# Patient Record
Sex: Male | Born: 1955
Health system: Southern US, Community
[De-identification: ages and names within clinical notes are randomized; demographics above are authoritative.]

## PROBLEM LIST (undated history)

## (undated) DIAGNOSIS — I10 Essential (primary) hypertension: Secondary | ICD-10-CM

## (undated) DIAGNOSIS — R252 Cramp and spasm: Secondary | ICD-10-CM

## (undated) DIAGNOSIS — R7611 Nonspecific reaction to tuberculin skin test without active tuberculosis: Secondary | ICD-10-CM

## (undated) DIAGNOSIS — E785 Hyperlipidemia, unspecified: Secondary | ICD-10-CM

## (undated) DIAGNOSIS — K219 Gastro-esophageal reflux disease without esophagitis: Secondary | ICD-10-CM

## (undated) DIAGNOSIS — K5900 Constipation, unspecified: Secondary | ICD-10-CM

## (undated) DIAGNOSIS — T7840XA Allergy, unspecified, initial encounter: Secondary | ICD-10-CM

## (undated) DIAGNOSIS — J40 Bronchitis, not specified as acute or chronic: Secondary | ICD-10-CM

## (undated) DIAGNOSIS — E119 Type 2 diabetes mellitus without complications: Secondary | ICD-10-CM

## (undated) DIAGNOSIS — A159 Respiratory tuberculosis unspecified: Secondary | ICD-10-CM

## (undated) HISTORY — DX: Bronchitis, not specified as acute or chronic: J40

## (undated) HISTORY — DX: Gastro-esophageal reflux disease without esophagitis: K21.9

## (undated) HISTORY — DX: Type 2 diabetes mellitus without complications: E11.9

## (undated) HISTORY — PX: POLYPECTOMY: SHX149

## (undated) HISTORY — PX: ELBOW SURGERY: SHX618

## (undated) HISTORY — PX: COLONOSCOPY: SHX174

## (undated) HISTORY — DX: Essential (primary) hypertension: I10

## (undated) HISTORY — DX: Respiratory tuberculosis unspecified: A15.9

## (undated) HISTORY — DX: Nonspecific reaction to tuberculin skin test without active tuberculosis: R76.11

## (undated) HISTORY — DX: Hyperlipidemia, unspecified: E78.5

## (undated) HISTORY — PX: FOOT SURGERY: SHX648

## (undated) HISTORY — DX: Cramp and spasm: R25.2

## (undated) HISTORY — DX: Allergy, unspecified, initial encounter: T78.40XA

## (undated) HISTORY — PX: OTHER SURGICAL HISTORY: SHX169

## (undated) HISTORY — DX: Constipation, unspecified: K59.00

---

## 1997-08-10 ENCOUNTER — Ambulatory Visit: Admission: RE | Admit: 1997-08-10 | Discharge: 1997-08-10 | Payer: Self-pay | Admitting: Cardiology

## 1997-09-09 ENCOUNTER — Emergency Department (HOSPITAL_COMMUNITY): Admission: EM | Admit: 1997-09-09 | Discharge: 1997-09-09 | Payer: Self-pay | Admitting: Emergency Medicine

## 1998-01-06 ENCOUNTER — Encounter: Payer: Self-pay | Admitting: Emergency Medicine

## 1998-01-06 ENCOUNTER — Emergency Department (HOSPITAL_COMMUNITY): Admission: EM | Admit: 1998-01-06 | Discharge: 1998-01-06 | Payer: Self-pay | Admitting: Emergency Medicine

## 1998-01-23 ENCOUNTER — Encounter: Admission: RE | Admit: 1998-01-23 | Discharge: 1998-01-23 | Payer: Self-pay | Admitting: Internal Medicine

## 1998-08-06 ENCOUNTER — Ambulatory Visit: Admission: RE | Admit: 1998-08-06 | Discharge: 1998-08-06 | Payer: Self-pay | Admitting: Cardiology

## 1998-08-06 ENCOUNTER — Encounter: Payer: Self-pay | Admitting: Cardiology

## 1999-04-21 ENCOUNTER — Encounter: Admission: RE | Admit: 1999-04-21 | Discharge: 1999-04-21 | Payer: Self-pay | Admitting: Internal Medicine

## 1999-06-05 ENCOUNTER — Encounter: Admission: RE | Admit: 1999-06-05 | Discharge: 1999-06-05 | Payer: Self-pay | Admitting: Internal Medicine

## 1999-06-05 ENCOUNTER — Ambulatory Visit (HOSPITAL_COMMUNITY): Admission: RE | Admit: 1999-06-05 | Discharge: 1999-06-05 | Payer: Self-pay | Admitting: Internal Medicine

## 1999-06-30 ENCOUNTER — Encounter: Admission: RE | Admit: 1999-06-30 | Discharge: 1999-06-30 | Payer: Self-pay | Admitting: Internal Medicine

## 1999-07-03 ENCOUNTER — Ambulatory Visit (HOSPITAL_COMMUNITY): Admission: RE | Admit: 1999-07-03 | Discharge: 1999-07-03 | Payer: Self-pay | Admitting: Gastroenterology

## 1999-07-03 ENCOUNTER — Encounter (INDEPENDENT_AMBULATORY_CARE_PROVIDER_SITE_OTHER): Payer: Self-pay | Admitting: Specialist

## 1999-09-07 ENCOUNTER — Other Ambulatory Visit: Admission: RE | Admit: 1999-09-07 | Discharge: 1999-09-07 | Payer: Self-pay | Admitting: Podiatry

## 1999-12-11 ENCOUNTER — Encounter: Admission: RE | Admit: 1999-12-11 | Discharge: 2000-02-25 | Payer: Self-pay | Admitting: Podiatry

## 2000-06-01 ENCOUNTER — Encounter: Payer: Self-pay | Admitting: Emergency Medicine

## 2000-06-01 ENCOUNTER — Emergency Department (HOSPITAL_COMMUNITY): Admission: EM | Admit: 2000-06-01 | Discharge: 2000-06-01 | Payer: Self-pay | Admitting: Emergency Medicine

## 2001-04-20 ENCOUNTER — Encounter: Payer: Self-pay | Admitting: Cardiology

## 2001-04-20 ENCOUNTER — Ambulatory Visit (HOSPITAL_COMMUNITY): Admission: RE | Admit: 2001-04-20 | Discharge: 2001-04-20 | Payer: Self-pay | Admitting: Cardiology

## 2001-07-12 ENCOUNTER — Ambulatory Visit (HOSPITAL_COMMUNITY): Admission: RE | Admit: 2001-07-12 | Discharge: 2001-07-12 | Payer: Self-pay | Admitting: Cardiology

## 2001-07-12 ENCOUNTER — Encounter: Payer: Self-pay | Admitting: Cardiology

## 2001-11-29 ENCOUNTER — Encounter: Payer: Self-pay | Admitting: Cardiology

## 2001-11-29 ENCOUNTER — Encounter: Admission: RE | Admit: 2001-11-29 | Discharge: 2001-11-29 | Payer: Self-pay | Admitting: Cardiology

## 2002-01-05 ENCOUNTER — Emergency Department (HOSPITAL_COMMUNITY): Admission: EM | Admit: 2002-01-05 | Discharge: 2002-01-06 | Payer: Self-pay | Admitting: Emergency Medicine

## 2002-01-08 ENCOUNTER — Emergency Department (HOSPITAL_COMMUNITY): Admission: EM | Admit: 2002-01-08 | Discharge: 2002-01-08 | Payer: Self-pay | Admitting: Emergency Medicine

## 2002-03-29 ENCOUNTER — Encounter: Payer: Self-pay | Admitting: Cardiology

## 2002-03-29 ENCOUNTER — Ambulatory Visit (HOSPITAL_COMMUNITY): Admission: RE | Admit: 2002-03-29 | Discharge: 2002-03-29 | Payer: Self-pay | Admitting: Cardiology

## 2002-04-04 ENCOUNTER — Ambulatory Visit (HOSPITAL_COMMUNITY): Admission: RE | Admit: 2002-04-04 | Discharge: 2002-04-04 | Payer: Self-pay | Admitting: Cardiology

## 2002-04-04 ENCOUNTER — Encounter: Payer: Self-pay | Admitting: Cardiology

## 2002-05-09 ENCOUNTER — Emergency Department (HOSPITAL_COMMUNITY): Admission: EM | Admit: 2002-05-09 | Discharge: 2002-05-10 | Payer: Self-pay | Admitting: Emergency Medicine

## 2002-09-05 ENCOUNTER — Encounter: Payer: Self-pay | Admitting: Emergency Medicine

## 2002-09-05 ENCOUNTER — Emergency Department (HOSPITAL_COMMUNITY): Admission: AD | Admit: 2002-09-05 | Discharge: 2002-09-05 | Payer: Self-pay | Admitting: Emergency Medicine

## 2002-12-31 ENCOUNTER — Ambulatory Visit (HOSPITAL_COMMUNITY): Admission: RE | Admit: 2002-12-31 | Discharge: 2002-12-31 | Payer: Self-pay | Admitting: Cardiology

## 2003-01-03 ENCOUNTER — Ambulatory Visit (HOSPITAL_COMMUNITY): Admission: RE | Admit: 2003-01-03 | Discharge: 2003-01-03 | Payer: Self-pay | Admitting: Cardiology

## 2003-05-02 ENCOUNTER — Emergency Department (HOSPITAL_COMMUNITY): Admission: EM | Admit: 2003-05-02 | Discharge: 2003-05-02 | Payer: Self-pay | Admitting: Family Medicine

## 2003-05-09 ENCOUNTER — Ambulatory Visit (HOSPITAL_COMMUNITY): Admission: RE | Admit: 2003-05-09 | Discharge: 2003-05-09 | Payer: Self-pay | Admitting: Cardiology

## 2004-01-31 ENCOUNTER — Ambulatory Visit (HOSPITAL_BASED_OUTPATIENT_CLINIC_OR_DEPARTMENT_OTHER): Admission: RE | Admit: 2004-01-31 | Discharge: 2004-01-31 | Payer: Self-pay | Admitting: Urology

## 2004-01-31 ENCOUNTER — Ambulatory Visit (HOSPITAL_COMMUNITY): Admission: RE | Admit: 2004-01-31 | Discharge: 2004-01-31 | Payer: Self-pay | Admitting: Urology

## 2004-08-14 ENCOUNTER — Ambulatory Visit (HOSPITAL_COMMUNITY): Admission: RE | Admit: 2004-08-14 | Discharge: 2004-08-14 | Payer: Self-pay | Admitting: Cardiology

## 2005-05-10 ENCOUNTER — Ambulatory Visit: Payer: Self-pay | Admitting: Cardiology

## 2005-05-10 ENCOUNTER — Ambulatory Visit (HOSPITAL_BASED_OUTPATIENT_CLINIC_OR_DEPARTMENT_OTHER): Admission: RE | Admit: 2005-05-10 | Discharge: 2005-05-10 | Payer: Self-pay | Admitting: Cardiology

## 2005-07-14 ENCOUNTER — Encounter: Payer: Self-pay | Admitting: Cardiology

## 2005-08-06 ENCOUNTER — Emergency Department (HOSPITAL_COMMUNITY): Admission: EM | Admit: 2005-08-06 | Discharge: 2005-08-06 | Payer: Self-pay | Admitting: Emergency Medicine

## 2005-12-20 ENCOUNTER — Ambulatory Visit (HOSPITAL_COMMUNITY): Admission: RE | Admit: 2005-12-20 | Discharge: 2005-12-21 | Payer: Self-pay | Admitting: Orthopedic Surgery

## 2005-12-23 ENCOUNTER — Encounter: Admission: RE | Admit: 2005-12-23 | Discharge: 2006-03-23 | Payer: Self-pay | Admitting: Orthopedic Surgery

## 2006-01-07 ENCOUNTER — Encounter: Admission: RE | Admit: 2006-01-07 | Discharge: 2006-01-07 | Payer: Self-pay | Admitting: Cardiology

## 2006-03-24 ENCOUNTER — Encounter: Admission: RE | Admit: 2006-03-24 | Discharge: 2006-06-22 | Payer: Self-pay | Admitting: Orthopedic Surgery

## 2006-06-23 ENCOUNTER — Encounter: Admission: RE | Admit: 2006-06-23 | Discharge: 2006-07-18 | Payer: Self-pay | Admitting: Orthopedic Surgery

## 2006-09-25 ENCOUNTER — Encounter: Admission: RE | Admit: 2006-09-25 | Discharge: 2006-09-25 | Payer: Self-pay | Admitting: Occupational Medicine

## 2006-11-29 ENCOUNTER — Ambulatory Visit (HOSPITAL_COMMUNITY): Admission: RE | Admit: 2006-11-29 | Discharge: 2006-11-29 | Payer: Self-pay | Admitting: Gastroenterology

## 2006-12-14 ENCOUNTER — Ambulatory Visit (HOSPITAL_COMMUNITY): Admission: RE | Admit: 2006-12-14 | Discharge: 2006-12-14 | Payer: Self-pay | Admitting: Cardiology

## 2006-12-14 ENCOUNTER — Emergency Department (HOSPITAL_COMMUNITY): Admission: EM | Admit: 2006-12-14 | Discharge: 2006-12-14 | Payer: Self-pay | Admitting: Emergency Medicine

## 2007-01-01 ENCOUNTER — Emergency Department (HOSPITAL_COMMUNITY): Admission: EM | Admit: 2007-01-01 | Discharge: 2007-01-01 | Payer: Self-pay | Admitting: *Deleted

## 2007-01-13 ENCOUNTER — Ambulatory Visit (HOSPITAL_COMMUNITY): Admission: RE | Admit: 2007-01-13 | Discharge: 2007-01-14 | Payer: Self-pay | Admitting: Orthopedic Surgery

## 2007-01-26 ENCOUNTER — Encounter: Admission: RE | Admit: 2007-01-26 | Discharge: 2007-03-15 | Payer: Self-pay | Admitting: Orthopedic Surgery

## 2007-03-20 ENCOUNTER — Encounter: Admission: RE | Admit: 2007-03-20 | Discharge: 2007-06-06 | Payer: Self-pay | Admitting: Internal Medicine

## 2007-06-13 ENCOUNTER — Encounter: Admission: RE | Admit: 2007-06-13 | Discharge: 2007-06-13 | Payer: Self-pay | Admitting: Cardiology

## 2008-01-24 ENCOUNTER — Ambulatory Visit: Admission: RE | Admit: 2008-01-24 | Discharge: 2008-01-24 | Payer: Self-pay | Admitting: Cardiology

## 2008-01-24 ENCOUNTER — Ambulatory Visit: Payer: Self-pay | Admitting: Vascular Surgery

## 2008-01-24 ENCOUNTER — Encounter (INDEPENDENT_AMBULATORY_CARE_PROVIDER_SITE_OTHER): Payer: Self-pay | Admitting: Cardiology

## 2008-01-24 LAB — ABI

## 2008-07-12 ENCOUNTER — Encounter: Admission: RE | Admit: 2008-07-12 | Discharge: 2008-07-12 | Payer: Self-pay | Admitting: Cardiology

## 2008-08-19 ENCOUNTER — Encounter: Admission: RE | Admit: 2008-08-19 | Discharge: 2008-08-19 | Payer: Self-pay | Admitting: Cardiology

## 2010-04-05 ENCOUNTER — Encounter: Payer: Self-pay | Admitting: Occupational Medicine

## 2010-04-05 ENCOUNTER — Encounter: Payer: Self-pay | Admitting: Cardiology

## 2010-07-28 NOTE — Op Note (Signed)
James Garcia, James Garcia                 ACCOUNT NO.:  [id number]   MEDICAL RECORD NO.:  [id number]          PATIENT TYPE:  AMB   LOCATION:  ENDO                         FACILITY:  [facility]   PHYSICIAN:  John C. Madilyn Fireman, M.D.    DATE OF BIRTH:  Nov 16, 1955   DATE OF PROCEDURE:  11/29/2006  DATE OF DISCHARGE:                               OPERATIVE REPORT   PROCEDURE:  Colonoscopy.   INDICATIONS FOR PROCEDURE:  Screening colonoscopy.   PROCEDURE:  The patient was placed in the left lateral decubitus  position and placed on the pulse monitor with continuous low-flow oxygen  delivered by nasal cannula.  He was sedated with 75 mcg IV fentanyl and  7.5 mg IV Versed.  Olympus video colonoscope was inserted into the  rectum and advanced to the cecum, confirmed by transillumination of  McBurney's point and visualization of the ileocecal valve and  appendiceal orifice.  Prep was excellent.  The cecum, ascending,  transverse descending and sigmoid colon all appeared normal with no  masses, polyps, diverticula or other mucosal abnormalities.  The rectum  likewise appeared normal.  On retroflexed view, the anus revealed no  obvious internal hemorrhoids.  Scope was then withdrawn and the patient  returned to the recovery room in stable condition.  He tolerated the  procedure well.  There were no immediate complications.   IMPRESSION:  Normal colonoscopy.   PLAN:  Repeat colonoscopy in 10 years.           ______________________________  Everardo All Madilyn Fireman, M.D.     JCH/MEDQ  D:  11/29/2006  T:  11/29/2006  Job:  [id number]   cc:   Osvaldo Shipper. Spruill, M.D.  Fax: [phone]

## 2010-07-28 NOTE — Op Note (Signed)
James Garcia, James Garcia                 ACCOUNT NO.:  [id number]   MEDICAL RECORD NO.:  [id number]          PATIENT TYPE:  OIB   LOCATION:  5039                         FACILITY:  [facility]   PHYSICIAN:  Almedia Balls. Ranell Patrick, M.D. DATE OF BIRTH:  January 28, 1956   DATE OF PROCEDURE:  01/13/2007  DATE OF DISCHARGE:  01/14/2007                               OPERATIVE REPORT   PREOPERATIVE DIAGNOSIS:  Right shoulder rotator cuff tear.   POSTOPERATIVE DIAGNOSES:  1. Right shoulder rotator cuff tear.  2. Right shoulder superior labral tear, anterior to posterior.   PROCEDURE PERFORMED:  Diagnostic arthroscopy with extensive intra-  articular debridement, including debridement of superior labrum anterior  and posterior (SLAP) lesion, as well as arthroscopic biceps tenotomy,  arthroscopic subacromial decompression, mini-open rotator cuff repair  and biceps tenodesis using Arthrex Biotenodesis screws, 7 x 23 mm.   ATTENDING SURGEON:  Almedia Balls. Ranell Patrick, M.D.   ASSISTANT:  Donnie Coffin. Dixon, P.A.-C.   ANESTHESIA:  General anesthesia and interscalene block anesthesia were  used.   ESTIMATED BLOOD LOSS:  Minimal.   FLUID REPLACEMENT:  1200 mL crystalloid.   COUNTS:  Instrument count was correct.   COMPLICATIONS:  There were no complications.   PERIOPERATIVE ANTIBIOTIC:  Given.   INDICATIONS:  The patient is a 55 year old male, who presents with a  history of a right shoulder injury.  The patient has had worsening pain  in his shoulder despite conservative management.  The patient has had  MRIs, which have documented a rotator cuff tear, and he presents now for  operative treatment, having failed conservative management.   DESCRIPTION OF PROCEDURE:  After an adequate level of anesthesia was  achieved, the patient was positioned in the modified beach-chair  position.  The right shoulder was examined under anesthesia and no undue  instability was noted.  There was also noted to be no significant  stiffness through a full passive range of motion, including external  rotation up to 60 degrees.  We then performed sterile prep and drape of  the right shoulder and examined the shoulder arthroscopically using  standard arthroscopic portals, including anterior, posterior and lateral  portals.  We identified fairly significant tearing and laxity within the  superior labrum anchor base, including an unstable anchor with a  Beauford complex.  Performed biceps tenotomy and a thorough labral  debridement.  The majority of the cord-like middle glenohumeral ligament  was debrided due to tearing.  This tearing extended all the way down  into the posterior labrum, and we debrided back to the 9:30 to 10-  o'clock position.  The remainder of the articular cartilage in the  glenohumeral joint was normal.  The subscapularis was normal.  The  rotator cuff was torn in the supraspinatus area; this was a complete  tear.  Following our debridement of the labrum and biceps tenotomy,  performed an arthroscopic subacromial decompression.  The scope was  placed in the subacromial space.  Performed a thorough bursectomy,  noting the full-thickness rotator cuff tear.  We then performed an  acromioplasty using butcher-block technique, creating a type  1 acromial  shape with CA ligament release.  We did not violate the inferior AC  ligaments.  At this point, we went ahead and concluded the arthroscopy,  and went ahead and made a mini-open incision in the raphe between the  internal and outer heads of the deltoid.  Dissection was carried sharply  down through the subcutaneous tissues.  The deltoid was split in line  with its fibers, and then we were able to visualize the rotator cuff  tear.  This was about a 3-cm tear, anterior to posterior, with 1 cm of  retraction, nice thickness in the tendon.  We placed a horizontal  mattress #2 FiberWire suture into the tendon such that we had 2 strands  of FiberWire that we  could pull down over the repair to make it nice and  low profile.  We then placed a single Arthrex Bio-corkscrew anchor  adjacent to the articular cartilage.  We freshened up the footprint  using a rongeur and curet.  We then went ahead and used the double-0 #2  FiberWire sutures brought out through the rotator cuff in a mattress  fashion.  We tied those down to recreate the medial footprint.  We then  brought the other mattress suture down over the lateral side of the  footprint with the 7.45 push-lock anchor by Arthrex.  This gave [place] a  nice low-profile repair.  We also put 1 more suture, a simple suture,  through bone to further bring down that anterior portion of the repair.  The posterior portion was reinforced with a 0 Vicryl figure-of-eight.  We had a nice low-profile repair.  We took the shoulder through a full  range of motion.  No impingement was noted.  We then identified the  biceps groove and incised that using a Bovie electrocautery.  We  identified the biceps tendon, whip stitched that using #2 FiberWire  suture.  We then biotenodesed that using an Arthrex Biotenodesis screw,  7 x 23 mm, with the elbow at 90 degrees.  The biceps groove soft tissue  was then closed using interrupted 0 Vicryl suture figure-of-eight,  followed by thorough irrigation of the entire subdeltoid interval.  We  then closed the deltoid to itself with interrupted 0 Vicryl suture,  followed by 2-0 Vicryl subcutaneous closure and 4-0 Monocryl for skin.  Steri-Strips were applied, followed by a sterile dressing.  The patient  tolerated surgery well.      Almedia Balls. Ranell Patrick, M.D.  Electronically Signed     SRN/MEDQ  D:  01/13/2007  T:  01/14/2007  Job:  [id number]

## 2010-07-31 NOTE — Op Note (Signed)
James Garcia, James Garcia                 ACCOUNT NO.:  [id number]   MEDICAL RECORD NO.:  [id number]          PATIENT TYPE:  OIB   LOCATION:  5032                         FACILITY:  [facility]   PHYSICIAN:  Almedia Balls. Ranell Patrick, M.D. DATE OF BIRTH:  20-Nov-1955   DATE OF PROCEDURE:  12/20/2005  DATE OF DISCHARGE:  12/21/2005                                 OPERATIVE REPORT   PREOPERATIVE DIAGNOSIS:  Left shoulder rotator cuff tear.   POSTOPERATIVE DIAGNOSIS:  1. Left shoulder rotator cuff tear.  2. Superior labral tear anterior to posterior.  3. Chronic impingement.   PROCEDURE PERFORMED:  Left shoulder examination under anesthesia, shoulder  arthroscopy with extensive intraarticular debridement of torn superior  labrum anterior to posterior as well as arthroscopic biceps tenotomy  followed by mini arthroscopic subacromial decompression, mini open rotator  cuff repair and open biceps tenodesis in the groove using Arthrex  Biotenodesis screw.   SURGEON:  Almedia Balls. Ranell Patrick, M.D.   ASSISTANT:  Donnie Coffin. Dixon, PA-C   ANESTHESIA:  General anesthesia plus interscalene block anesthesia was used.   ESTIMATED BLOOD LOSS:  Minimal.   FLUIDS REPLACED:  1200 mL crystalloid.   INSTRUMENT COUNTS:  Correct.   COMPLICATIONS:  None.   Preoperative antibiotics given.   INDICATIONS:  The patient is a 55 year old male with a history of worsening  left shoulder pain secondary to a known rotator cuff tear.  The patient has  failed conservative treatment and now desires operative management to  restore function and eliminate pain.  Informed consent was obtained.   DESCRIPTION OF PROCEDURE:  After adequate level of anesthesia was achieved,  the patient positioned in the modified beach chair position.  All  neurovascular structures padded appropriately.  The left shoulder was  examined under anesthesia.  Full passive range of motion was noted.  No  instability was identified.  The left shoulder was then  sterilely prepped  and draped in the usual manner.  We entered the shoulder arthroscopically  using standard portals.  Anterior, posterior and lateral portals created in  similar fashion with infiltration of the skin with 0.25% Marcaine with  epinephrine followed by incision with an 11-blade scalpel.  Introduction of  the cannula into the joint using blunt obturators.  Diagnostic fluoroscopy  revealed extensive tearing of the superior labrum involving unstable biceps  anchor.  The patient had a thorough superior labral debridement with a box  forceps and motorized shaver.  We preformed a biceps tenotomy and  debridement of the rotator cuff tear to get visualization of that.  We noted  there to be a large U-shape tear involving supraspinatus, infraspinatus.  At  this point we inspected the subscapularis, this was noted to be intact.  Glenohumeral articular cartilage was in good condition, anterior and  inferior labrum and posterior labrum intact.  I went ahead and placed the  scope into the subacromial space, performed a thorough bursectomy and  acromioplasty, creating a type 1 acromion with a nice flat open outlet.  The  CA ligament was released.  Next, we performed a mini open incision starting  at the anterolateral border of the acromion, extending down in-line with the  deltoid fibers about 4 cm. Dissection carried sharply down to the  subcutaneous tissues.  The deltoid split in line with its fibers.  Biceps  groove identified and incised with Bovie.  Identified the biceps tendon,  whipstitch that with #2 FiberWire suture followed by tenodesis of the biceps  using an 8 x 23-mm Arthrex Biotenodesis screw with the elbow at 90 degrees  and the biceps on mid tension.  At this point we went ahead and addressed  the rotator cuff tear.  This is a large U-shaped tear.  First addressed with  margin convergence using a FiberWire suture #2 from the front to the back.  This allowed [place] to use only 1  anchor, which we used adjacent to the  articular margin.  We prepared the footprint utilizing a rongeur to remove  soft tissue overlying the bone.  We were down to nice bleeding bone and  placed a single 5.5-mm Arthrex Bio-corkscrew anchor and then double layered  with #2 FiberWire suture and we brought those up in a mattress fashion  through the front and back leads of the rotator cuff.  Again, margin  convergence tied first and then the 2 sutures from the anchor, gave [place] a  nice low profile repair.  We are very happy with the repair.  Complete  coverage of the defect and then took the shoulder through full range of  motion, no impingement was noted.  Thorough irrigation followed by closure  of the deltoid using interrupted 0-Vicryl suture, 2-0 Vicryl for  subcutaneous tissue and 4-0 running Monocryl for skin.  Steri-Strips applied  followed by a sterile dressing.  The patient tolerated the procedure well.           ______________________________  Almedia Balls. Ranell Patrick, M.D.     SRN/MEDQ  D:  12/20/2005  T:  12/21/2005  Job:  [id number]

## 2010-07-31 NOTE — Procedures (Signed)
NAME:  James Garcia, James Garcia                 ACCOUNT NO.:  [id number]   MEDICAL RECORD NO.:  [id number]          PATIENT TYPE:  OUT   LOCATION:  SLEEP CENTER                 FACILITY:  [facility]   PHYSICIAN:  Clinton D. Maple Hudson, M.D. DATE OF BIRTH:  07-23-1955   DATE OF STUDY:  05/10/2005                              NOCTURNAL POLYSOMNOGRAM   REFERRING PHYSICIAN:  Dr. Donia Guiles.   DATE OF STUDY:  May 10, 2005.   INDICATION FOR STUDY:  Hypersomnia with sleep apnea. Epworth sleepiness  19/24, BMI 41, weight 310 pounds. Home medications: Actos, Glucophage,  Lotensin.   SLEEP ARCHITECTURE:  Total sleep time 208 minutes with sleep efficiency 51%.  Stage I was 19%, stage II 74%, stages III and IV were absent, REM was 7% of  total sleep time. Sleep latency 13 minutes, REM latency 65 minutes, awake  after sleep onset 176 minutes, arousal index increased at 33.8 indicating  fragmentation. The patient's habitus to sleep on his stomach and the monitor  leads interfered some with that.   RESPIRATORY DATA:  Split study protocol. Apnea-hypopnea index (AHI, RDI)  71.6 obstructive events per hour indicating severe obstructive sleep  apnea/hypopnea syndrome. This reflected 30 obstructive apneas and 115  hypopneas before CPAP. Most events were recorded while supine or sleeping on  left side without significant improvement on side. REM AHI 84 per hour. CPAP  was titrated with incomplete control partly because the patient could not  maintain sleep. There were still residual events up to 17 CWP. He will  probably benefit from a return for formal CPAP titration. At 7 CWP recorded  for 81 minutes AHI was 1.9 per hour but the patient requested more air. At  17 CWP recorded for 12 minutes there were a few breakthrough events with a  high index because of the short recorded time. He settled on a medium  Respironics ComfortGel nasal mask with chin strap and heated humidifier  after trying a full face mask  initially.   OXYGEN DATA:  Moderate snoring with oxygen desaturation to a nadir of 82%.  After CPAP control saturation held 92-94% on room air.   CARDIAC DATA:  Normal sinus rhythm.   MOVEMENT/PARASOMNIA:  A total of 113 limb jerks were recorded of which 17  were associated with arousal or awakening for a periodic limb movement with  arousal index of 4.9 per hour which is increased.   IMPRESSION/RECOMMENDATION:  1.  Severe obstructive sleep apnea/hypopnea syndrome, apnea-hypopnea index      71.6 per hour with sleep fragmentation nonpositional events, moderate      snoring and oxygen desaturation to a nadir of 82%.  2.  Incomplete continuous positive airway pressure titration up to 17 CWP      with some residual events at that pressure. Titration was limited by the      patient's inability to maintain sleep. Consider home trial at 17 CWP or      return for continuous positive airway pressure  titration. In either      case, he is likely to benefit from a sleep medication to help      consolidate sleep.  A medium Respironics ComfortGel nasal mask was used      with heated humidifier. The technician added a chin strap.  3.  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 On days when he works a part-time job he is up by 5 to 5:30      a.m. These reflect probable inadequate sleep and need for instruction in      sleep hygiene which may well be contributing to complaints of daytime      sleepiness and should be addressed as part of his therapy.      Clinton D. Maple Hudson, M.D.  Diplomate, Biomedical engineer of Sleep Medicine  Electronically Signed     CDY/MEDQ  D:  05/16/2005 11:91:47  T:  05/16/2005 82:95:62  Job:  [id number]

## 2010-07-31 NOTE — Op Note (Signed)
NAMEWORLEY, RADERMACHER                 ACCOUNT NO.:  [id number]   MEDICAL RECORD NO.:  [id number]          PATIENT TYPE:  AMB   LOCATION:  NESC                         [facility]ACILITY[facility]   PHYSICIAN:  Lindaann Slough, M.D.  DATE OF BIRTH:  [birth year]   DATE OF PROCEDURE:  01/31/2004  DATE OF DISCHARGE:                                 OPERATIVE REPORT   PREOPERATIVE DIAGNOSIS:  Phimosis and balanitis.   POSTOPERATIVE DIAGNOSIS:  Phimosis and balanitis.   PROCEDURE DONE:  Circumcision.   SURGEON:  Dr. Brunilda Payor.   ANESTHESIA:  General.   INDICATIONS:  The patient is a 55 year old male who has been complaining of  difficulty retracting his foreskin and irritation of the foreskin. He was  found on physical examination to have phimosis and balanitis. He is  scheduled today for circumcision.   Under general anesthesia, the patient was prepped and draped and placed in  the supine position. A penile block was done with 0.25% Marcaine. Then, 2  circumferential incisions were made on the foreskin, and the foreskin in  between those 2 incisions was excised. Hemostasis was _______________  electrocautery. Skin approximation was done with #4-0 chromic.   The patient tolerated the procedure well and the left the OR in satisfactory  condition to post anesthesia care unit.      MN/MEDQ  D:  01/31/2004  T:  02/01/2004  Job:  [id number]   cc:   Osvaldo Shipper. Spruill, M.D.  [address] [address]  [place]  [place] [address]  Fax: [phone]

## 2010-07-31 NOTE — Cardiovascular Report (Signed)
NAME:  James Garcia, James Garcia                           ACCOUNT NO.:  [id number]   MEDICAL RECORD NO.:  [id number]                   PATIENT TYPE:  OIB   LOCATION:  2855                                 FACILITY:  [facility]   PHYSICIAN:  Mohan N. Sharyn Lull, M.D.              DATE OF BIRTH:  Jul 21, 1955   DATE OF PROCEDURE:  01/03/2003  DATE OF DISCHARGE:                              CARDIAC CATHETERIZATION   PROCEDURE:  1. Left cardiac catheterization.  2. Selective left and right coronary angiography.  3. Left ventriculography via right groin using Judkins technique.   INDICATIONS:  James Garcia is a 55 year old Hispanic male with past  medical history significant for hypertension, non-insulin-dependent diabetes  mellitus, morbid obesity.  Complains of retrosternal and precordial chest  tightness off and on without associated symptoms.  The patient denies any  nausea, vomiting, diaphoresis.  Denies PND, orthopnea, leg swelling.  Also  complains of exertional dyspnea and feeling weak and tired.  The patient  underwent Persantine Cardiolite which showed generalized decreased perfusion  in the anterolateral and inferior wall as compared to rest study and was  felt to be technical difficulty versus ischemia.  The patient also complains  of erectile dysfunction.   PAST MEDICAL HISTORY:  As above.   PAST SURGICAL HISTORY:  Right dislocated elbow.   ALLERGIES:  No known drug allergies.   MEDICATIONS:  1. Lotensin 20 mg p.o. daily.  2. Actos 30 mg p.o. daily.  3. Glucotrol XL 10 mg p.o. b.i.d.  4. Nitrostat sublingual p.r.n.  5. Baby aspirin 81 mg p.o. daily.   SOCIAL HISTORY:  He is married.  Has two children.  No history of smoking or  alcohol abuse.  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  Born in [place].  Moved here approximately 20 years ago.   FAMILY HISTORY:  Father died of diabetic complications at the age of 38.  Mother is alive.  She had stroke.  She is  hypertensive and diabetic.   PHYSICAL EXAMINATION:  GENERAL:  Alert, awake, oriented x3, in no acute  distress.  VITAL SIGNS:  Blood pressure 124/76, pulse 88, regular.  HEENT:  Conjunctiva was pink.  NECK:  Supple.  No JVD.  No bruit.  LUNGS:  Clear to auscultation without rhonchi or rales.  CARDIAC:  S1, S2 normal.  There was no S3 gallop.  ABDOMEN:  Soft, obese, nontender.  EXTREMITIES:  No clubbing, cyanosis, edema.   IMPRESSION:  1. Chest pain.  2. Positive Persantine Cardiolite.  3. Hypertension.  4. Non-insulin-dependent diabetes mellitus.  5. Morbid obesity.   PLAN:  Discussed with patient regarding left catheterization, possible PTCA  and stenting, its risks, i.e., death, MI, stroke, need for emergency CABG,  risk of restenosis, local vascular complications, etc. and consented for  PCI.   PROCEDURE:  After obtaining informed consent patient was brought to the  catheterization  laboratory and was placed on fluoroscopy table.  Right groin  was prepped and draped in usual fashion.  2% Xylocaine was used for local  anesthesia in the right groin.  With the help of thin wall needle a 6-French  arterial sheath was placed.  The sheath was aspirated and flushed.  Next, 6-  French left Judkins catheter was advanced over the wire under fluoroscopic  guidance up to the ascending aorta where it was pulled out.  The catheter  was aspirated and connected to the manifold.  Catheter was further advanced  and engaged into left coronary ostium.  Multiple views of the left system  were taken.  Next, the catheter was disengaged and was pulled out over the  wire and was replaced with 6-French right Judkins catheter which was  advanced over the wire under fluoroscopic guidance up to the ascending aorta  where it was pulled out.  The catheter was aspirated and connected to the  manifold.  Catheter was further advanced and engaged into right coronary  ostium.  Multiple views of the right system  were taken.  Next, the catheter  was disengaged and was pulled out over the wire and was replaced with 6-  French pigtail catheter which was advanced over the wire under fluoroscopic  guidance up to the ascending aorta where it was pulled out.  The catheter  was aspirated and connected to the manifold.  Catheter was further advanced  across the aortic valve into the LV.  LV pressures were recorded.  Next, LV  graphy was done in 30 degree RAO position.  Post angiographic pressures were  recorded from LV and then pullback pressures were recorded from aorta.  There was no gradient across the aortic valve.  Next, the pigtail catheter  was pulled out over the wire.  Sheaths were aspirated and flushed.   FINDINGS:  LV showed good LV systolic function, EF of 55-60%.  Left main was  patent.  LAD was patent in proximal and mid portion and was diffusely  diseased distally.  Diagonal 1 was medium size which was patent.  Diagonal 2  was very small which was patent.  Left circumflex was patent.  OM 1 was  medium size with bifurcation into superior and inferior branch.  Both these  branches were patent.  OM 2 and OM 3 were small which were patent.  RCA was  patent.  Arteriotomy was closed with Perclose without complications.  The  patient tolerated procedure well.  There were no complications.  The patient  was transferred to recovery room in stable condition.                                               Eduardo Osier. Sharyn Lull, M.D.    MNH/MEDQ  D:  01/03/2003  T:  01/03/2003  Job:  [id number]   cc:   Osvaldo Shipper. Spruill, M.D.  [address] [address]  [place]  [place] [address]  Fax: [phone]   Cath Lab

## 2010-12-22 LAB — BASIC METABOLIC PANEL
Calcium: 8.2 — ABNORMAL LOW
Chloride: 99
GFR calc Af Amer: >60
Potassium: 3.2 — ABNORMAL LOW
Sodium: 135

## 2010-12-23 LAB — URINALYSIS, ROUTINE W REFLEX MICROSCOPIC
Bilirubin Urine: NEGATIVE
Glucose, UA: 100 — AB
Glucose, UA: >1000 — AB
Hgb urine dipstick: NEGATIVE
Ketones, ur: NEGATIVE
Nitrite: NEGATIVE
Specific Gravity, Urine: 1.027
Specific Gravity, Urine: 1.029
Urobilinogen, UA: 0.2
Urobilinogen, UA: 1

## 2010-12-23 LAB — PROTIME-INR: Prothrombin Time: 13.9

## 2010-12-23 LAB — BASIC METABOLIC PANEL
BUN: 18
Creatinine, Ser: 0.94
GFR calc Af Amer: >60
GFR calc non Af Amer: >60
Glucose, Bld: 164 — ABNORMAL HIGH
Sodium: 133 — ABNORMAL LOW

## 2010-12-23 LAB — DIFFERENTIAL
Basophils Relative: 0
Lymphs Abs: 2
Monocytes Relative: 4
Neutrophils Relative %: 66

## 2010-12-23 LAB — POCT CARDIAC MARKERS
CKMB, poc: 1.1
Myoglobin, poc: 42.6
Troponin i, poc: <0.05

## 2010-12-23 LAB — CBC
Platelets: 160
RDW: 12.6
WBC: 7.3

## 2010-12-23 LAB — URINE MICROSCOPIC-ADD ON

## 2010-12-23 LAB — I-STAT 8, (EC8 V) (CONVERTED LAB)
Acid-Base Excess: 2
Chloride: 99
Glucose, Bld: 309 — ABNORMAL HIGH
HCT: 53 — ABNORMAL HIGH
Hemoglobin: 18 — ABNORMAL HIGH
Operator id: 284141
Potassium: 4
Sodium: 133 — ABNORMAL LOW
TCO2: 29
pCO2, Ven: 45.5

## 2012-01-26 ENCOUNTER — Other Ambulatory Visit: Payer: Self-pay | Admitting: Cardiology

## 2012-01-26 ENCOUNTER — Ambulatory Visit
Admission: RE | Admit: 2012-01-26 | Discharge: 2012-01-26 | Disposition: A | Discharge status: Home / Self Care | Payer: 59 | Source: Ambulatory Visit | Attending: Cardiology | Admitting: Cardiology

## 2012-01-26 DIAGNOSIS — I10 Essential (primary) hypertension: Secondary | ICD-10-CM

## 2012-01-26 DIAGNOSIS — R5381 Other malaise: Secondary | ICD-10-CM

## 2012-01-26 DIAGNOSIS — Z9289 Personal history of other medical treatment: Secondary | ICD-10-CM

## 2012-01-26 DIAGNOSIS — R5383 Other fatigue: Secondary | ICD-10-CM

## 2012-02-02 ENCOUNTER — Ambulatory Visit (HOSPITAL_COMMUNITY): Admission: RE | Admit: 2012-02-02 | Payer: 59 | Source: Ambulatory Visit

## 2013-03-30 ENCOUNTER — Encounter: Payer: Self-pay | Admitting: Internal Medicine

## 2013-03-30 ENCOUNTER — Ambulatory Visit (INDEPENDENT_AMBULATORY_CARE_PROVIDER_SITE_OTHER)
Admission: RE | Admit: 2013-03-30 | Discharge: 2013-03-30 | Disposition: A | Discharge status: Home / Self Care | Payer: 59 | Source: Ambulatory Visit | Attending: Internal Medicine | Admitting: Internal Medicine

## 2013-03-30 ENCOUNTER — Other Ambulatory Visit (INDEPENDENT_AMBULATORY_CARE_PROVIDER_SITE_OTHER): Payer: 59

## 2013-03-30 ENCOUNTER — Ambulatory Visit (INDEPENDENT_AMBULATORY_CARE_PROVIDER_SITE_OTHER): Payer: 59 | Admitting: Internal Medicine

## 2013-03-30 VITALS — BP 114/60 | HR 64 | Temp 97.9°F | Ht 73.0 in | Wt 268.0 lb

## 2013-03-30 DIAGNOSIS — R05 Cough: Secondary | ICD-10-CM

## 2013-03-30 DIAGNOSIS — R059 Cough, unspecified: Secondary | ICD-10-CM

## 2013-03-30 DIAGNOSIS — A150 Tuberculosis of lung: Secondary | ICD-10-CM

## 2013-03-30 DIAGNOSIS — A159 Respiratory tuberculosis unspecified: Secondary | ICD-10-CM

## 2013-03-30 LAB — CBC WITH DIFFERENTIAL/PLATELET
BASOS ABS: 0 10*3/uL (ref 0.0–0.1)
BASOS PCT: 0.5 % (ref 0.0–3.0)
EOS PCT: 4.6 % (ref 0.0–5.0)
Eosinophils Absolute: 0.3 10*3/uL (ref 0.0–0.7)
HEMATOCRIT: 45.8 % (ref 39.0–52.0)
HEMOGLOBIN: 15.8 g/dL (ref 13.0–17.0)
LYMPHS ABS: 1.7 10*3/uL (ref 0.7–4.0)
Lymphocytes Relative: 25.6 % (ref 12.0–46.0)
MCHC: 34.5 g/dL (ref 30.0–36.0)
MCV: 88.9 fl (ref 78.0–100.0)
MONO ABS: 0.5 10*3/uL (ref 0.1–1.0)
MONOS PCT: 7.2 % (ref 3.0–12.0)
NEUTROS ABS: 4.2 10*3/uL (ref 1.4–7.7)
Neutrophils Relative %: 62.1 % (ref 43.0–77.0)
PLATELETS: 148 10*3/uL — AB (ref 150.0–400.0)
RBC: 5.15 Mil/uL (ref 4.22–5.81)
RDW: 12.8 % (ref 11.5–14.6)
WBC: 6.8 10*3/uL (ref 4.5–10.5)

## 2013-03-30 NOTE — Progress Notes (Signed)
Subjective:    Patient ID: James Garcia, male    DOB: 1955/09/27  MRN: [id number]  HPI  58 yo [place] male never regular smoker  in [place] since around 1980 with Pos TB test around 1990 treated by [facility] for "about 6 months" ? Med referred 03/30/2013  by Dr Montez Morita for recurrent cough    03/30/2013 1st [facility] office visit/ Yanette Tripoli cc lifelong pattern of cough with uri's that happens maybe once a year and lasts a week or two and episode worsen  than unusual x one week prior to [place] with sob when sick.  Also year round am cough with sneezing. No excess or purulent mucus even with flares. In addition mild doe with heavy exertion x years even when not coughing   No obvious patterns in day to day or daytime variabilty or assoc   or cp or chest tightness, subjective wheeze overt sinus or hb symptoms. No unusual exp hx or h/o childhood pna/ asthma or knowledge of premature birth.  Sleeping ok without nocturnal  or early am exacerbation  of respiratory  c/o's or need for noct saba. Also denies any obvious fluctuation of symptoms with weather or environmental changes or other aggravating or alleviating factors except as outlined above   Current Medications, Allergies, Complete Past Medical History, Past Surgical History, Family History, and Social History were reviewed in [employer] record.  ROS  The following are not active complaints unless bolded sore throat, dysphagia, dental problems, itching, sneezing,  nasal congestion or excess/ purulent secretions, ear ache,   fever, chills, sweats, unintended wt loss, pleuritic or exertional cp, hemoptysis,  orthopnea pnd or leg swelling, presyncope, palpitations, heartburn, abdominal pain, anorexia, nausea, vomiting, diarrhea  or change in bowel or urinary habits, change in stools or urine, dysuria,hematuria,  rash, arthralgias, visual complaints, headache, numbness weakness or ataxia or problems with walking or  coordination,  change in mood/affect or memory.       Review of Systems  Constitutional: Negative for fever, chills, activity change, appetite change and unexpected weight change.  HENT: Negative for congestion, dental problem, postnasal drip, rhinorrhea, sneezing, sore throat, trouble swallowing and voice change.   Eyes: Negative for visual disturbance.  Respiratory: Positive for cough and shortness of breath. Negative for choking.   Cardiovascular: Negative for chest pain and leg swelling.  Gastrointestinal: Negative for nausea, vomiting and abdominal pain.  Genitourinary: Negative for difficulty urinating.  Musculoskeletal: Negative for arthralgias.  Skin: Negative for rash.  Psychiatric/Behavioral: Negative for behavioral problems and confusion.       Objective:   Physical Exam   amb obese latino male nad, very diffiuclt historian ? All due to language gap?, much older than stated age  Wt Readings from Last 3 Encounters:  03/30/13 268 lb (121.564 kg)     HEENT: nl dentition, turbinates, and orophanx. Nl external ear canals without cough reflex   NECK :  without JVD/Nodes/TM/ nl carotid upstrokes bilaterally   LUNGS: no acc muscle use, clear to A and P bilaterally without cough on insp or exp maneuvers   CV:  RRR  no s3 or murmur or increase in P2, no edema   ABD:  soft and nontender with nl excursion in the supine position. No bruits or organomegaly, bowel sounds nl  MS:  warm without deformities, calf tenderness, cyanosis or clubbing  SKIN: warm and dry without lesions    NEURO:  alert, approp, no deficits  CXR  03/30/2013 :   Mediastinum and hilar structures are normal. Lungs are clear of infiltrates. Heart size is stable. No pleural effusion or pneumothorax.       Assessment & Plan:

## 2013-03-30 NOTE — Patient Instructions (Addendum)
Add chlortrimeton (over the counter)4 mg at bedtime every night to see if it helps your am sneezing/congestion  In the event cough returns at any point >  Try delsym 2 tsp every 12 hours and start prilosec 20mg   Take 30-60 min before first meal of the day and Pepcid 20 mg one bedtime until cough is completely gone for at least a week without the need for cough suppression (these are all over the counter meds)  If not satisfied with these recommendations, return here for follow up during the flare.  Please remember to go to the lab and x-ray department downstairs for your tests - we will call you with the results when they are available.

## 2013-03-31 DIAGNOSIS — A159 Respiratory tuberculosis unspecified: Secondary | ICD-10-CM | POA: Insufficient documentation

## 2013-03-31 NOTE — Assessment & Plan Note (Signed)
-   Allergy profile 03/30/13 > no eos/ IgE 138 >>>  The pattern of chronic am sneeze/cough may be low grade allergic rhinitis vs noct reflux vs both. rec trial of pepcid and 1st gen h1 at hs  The other pattern of severe cough x sev weeks "maybe once a year" is more typical of a uri with or without secondary gerd from coughing. Explained natural history of uri and why it's necessary in patients at risk(as he is from obesity) to treat GERD  aggressively  at least  short term   to reduce risk of evolving cyclical cough initially  triggered by epithelial injury and a heightened sensitivty to the effects of any upper airway irritants,  most importantly acid - related.  That is, the more sensitive the epithelium damaged for virus, the more the cough, the more the secondary reflux (especially in those prone to reflux) the more the irritation of the sensitive mucosa and so on in a cyclical pattern.

## 2013-03-31 NOTE — Assessment & Plan Note (Signed)
Pos skin test [facility] ? [facility] m rx   This was so long ago that unlikely to have a record of it but I'm sure he was treated appropriately and there's nothing in hx or cxr to suggest otherwise, no further eval needed, no further skin testing indicated

## 2013-04-02 ENCOUNTER — Encounter: Payer: Self-pay | Admitting: Internal Medicine

## 2013-04-02 LAB — ALLERGY PROFILE REGION II-DC, DE, MD, ~~LOC~~, VA
Alternaria Alternata: <0.1 kU/L
Bermuda Grass: <0.1 kU/L
Cladosporium Herbarum: <0.1 kU/L
Cockroach: <0.1 kU/L
Common Ragweed: <0.1 kU/L
Dog Dander: <0.1 kU/L
IGE (IMMUNOGLOBULIN E), SERUM: 138.5 [IU]/mL (ref 0.0–180.0)
Johnson Grass: <0.1 kU/L
Lamb's Quarters: <0.1 kU/L
Meadow Grass: <0.1 kU/L
Pecan/Hickory Tree IgE: 0.94 kU/L — ABNORMAL HIGH

## 2013-04-02 NOTE — Progress Notes (Signed)
Quick Note:  LMTCB ______ 

## 2013-04-03 NOTE — Progress Notes (Signed)
Quick Note:  LMTCB ______ 

## 2013-04-04 NOTE — Progress Notes (Signed)
Quick Note:  LMTCB ______ 

## 2013-04-05 ENCOUNTER — Encounter: Payer: Self-pay | Admitting: Internal Medicine

## 2013-04-05 NOTE — Progress Notes (Signed)
Quick Note:    Letter mailed.  ______

## 2013-04-05 NOTE — Progress Notes (Signed)
Quick Note:  Letter mailed to call for the results ______

## 2013-06-23 ENCOUNTER — Ambulatory Visit: Payer: 59

## 2013-08-28 ENCOUNTER — Encounter: Payer: Self-pay | Admitting: Family Medicine

## 2014-01-28 ENCOUNTER — Emergency Department (INDEPENDENT_AMBULATORY_CARE_PROVIDER_SITE_OTHER)
Admission: EM | Admit: 2014-01-28 | Discharge: 2014-01-28 | Disposition: A | Discharge status: Home / Self Care | Payer: 59 | Source: Home / Self Care | Attending: Family Medicine | Admitting: Family Medicine

## 2014-01-28 ENCOUNTER — Encounter (HOSPITAL_COMMUNITY): Payer: Self-pay | Admitting: Emergency Medicine

## 2014-01-28 DIAGNOSIS — J069 Acute upper respiratory infection, unspecified: Secondary | ICD-10-CM

## 2014-01-28 DIAGNOSIS — B9789 Other viral agents as the cause of diseases classified elsewhere: Principal | ICD-10-CM

## 2014-01-28 DIAGNOSIS — H109 Unspecified conjunctivitis: Secondary | ICD-10-CM

## 2014-01-28 MED ORDER — IPRATROPIUM BROMIDE 0.06 % NA SOLN: 2.0000 | Freq: Four times a day (QID) | NASAL | Status: DC

## 2014-01-28 MED ORDER — GUAIFENESIN-CODEINE 100-10 MG/5ML PO SOLN: 5.0000 mL | Freq: Every evening | ORAL | Status: DC | PRN

## 2014-01-28 MED ORDER — POLYMYXIN B-TRIMETHOPRIM 10000-0.1 UNIT/ML-% OP SOLN: 2.0000 [drp] | OPHTHALMIC | Status: DC

## 2014-01-28 NOTE — ED Notes (Signed)
C/o cold sx onset 4 days Sx include: ST, cough, congestion and poss pink eye  Denies f/v/n/d Alert, no signs of acute distress.

## 2014-01-28 NOTE — Discharge Instructions (Signed)
Thank you for coming in today. Call or go to the emergency room if you get worse, have trouble breathing, have chest pains, or palpitations.  Do not take codeine and drive.   Bacterial Conjunctivitis Bacterial conjunctivitis, commonly called pink eye, is an inflammation of the clear membrane that covers the white part of the eye (conjunctiva). The inflammation can also happen on the underside of the eyelids. The blood vessels in the conjunctiva become inflamed, causing the eye to become red or pink. Bacterial conjunctivitis may spread easily from one eye to another and from person to person (contagious).  CAUSES  Bacterial conjunctivitis is caused by bacteria. The bacteria may come from your own skin, your upper respiratory tract, or from someone else with bacterial conjunctivitis. SYMPTOMS  The normally white color of the eye or the underside of the eyelid is usually pink or red. The pink eye is usually associated with irritation, tearing, and some sensitivity to light. Bacterial conjunctivitis is often associated with a thick, yellowish discharge from the eye. The discharge may turn into a crust on the eyelids overnight, which causes your eyelids to stick together. If a discharge is present, there may also be some blurred vision in the affected eye. DIAGNOSIS  Bacterial conjunctivitis is diagnosed by your caregiver through an eye exam and the symptoms that you report. Your caregiver looks for changes in the surface tissues of your eyes, which may point to the specific type of conjunctivitis. A sample of any discharge may be collected on a cotton-tip swab if you have a severe case of conjunctivitis, if your cornea is affected, or if you keep getting repeat infections that do not respond to treatment. The sample will be sent to a lab to see if the inflammation is caused by a bacterial infection and to see if the infection will respond to antibiotic medicines. TREATMENT   Bacterial conjunctivitis is  treated with antibiotics. Antibiotic eyedrops are most often used. However, antibiotic ointments are also available. Antibiotics pills are sometimes used. Artificial tears or eye washes may ease discomfort. HOME CARE INSTRUCTIONS   To ease discomfort, apply a cool, clean washcloth to your eye for 10-20 minutes, 3-4 times a day.  Gently wipe away any drainage from your eye with a warm, wet washcloth or a cotton ball.  Wash your hands often with soap and water. Use paper towels to dry your hands.  Do not share towels or washcloths. This may spread the infection.  Change or wash your pillowcase every day.  You should not use eye makeup until the infection is gone.  Do not operate machinery or drive if your vision is blurred.  Stop using contact lenses. Ask your caregiver how to sterilize or replace your contacts before using them again. This depends on the type of contact lenses that you use.  When applying medicine to the infected eye, do not touch the edge of your eyelid with the eyedrop bottle or ointment tube. SEEK IMMEDIATE MEDICAL CARE IF:   Your infection has not improved within 3 days after beginning treatment.  You had yellow discharge from your eye and it returns.  You have increased eye pain.  Your eye redness is spreading.  Your vision becomes blurred.  You have a fever or persistent symptoms for more than 2-3 days.  You have a fever and your symptoms suddenly get worse.  You have facial pain, redness, or swelling. MAKE SURE YOU:   Understand these instructions.  Will watch your condition.  Will  get help right away if you are not doing well or get worse. Document Released: 03/01/2005 Document Revised: 07/16/2013 Document Reviewed: 08/02/2011 [facility] Patient Information 2015 [address], [place]. This information is not intended to replace advice given to you by your health care provider. Make sure you discuss any questions you have with your health care  provider.

## 2014-01-28 NOTE — ED Provider Notes (Signed)
James Garcia is a 58 y.o. male who presents to Urgent Care today for cough congestion and runny eyes present for 4 days. No significant shortness of breath. No fevers or chills vomiting or diarrhea. Patient has tried some over-the-counter medications which have helped a bit.   Past Medical History  Diagnosis Date  . DM type 2 (diabetes mellitus, type 2)   . Tuberculin skin test (TST) positive    History reviewed. No pertinent past surgical history. History  Substance Use Topics  . Smoking status: Never Smoker   . Smokeless tobacco: Never Used  . Alcohol Use: No   ROS as above Medications: No current facility-administered medications for this encounter.   Current Outpatient Prescriptions  Medication Sig Dispense Refill  . pregabalin (LYRICA) 100 MG capsule Take 100 mg by mouth 2 (two) times daily.    . sitaGLIPtin-metformin (JANUMET) 50-500 MG per tablet Take 1 tablet by mouth 2 (two) times daily with a meal.    . Dapagliflozin Propanediol (FARXIGA) 5 MG TABS Take 5 mg by mouth daily.    Marland Kitchen gabapentin (NEURONTIN) 100 MG capsule Take 100 mg by mouth 2 (two) times daily.    Marland Kitchen guaiFENesin-codeine 100-10 MG/5ML syrup Take 5 mLs by mouth at bedtime as needed for cough. 120 mL 0  . ipratropium (ATROVENT) 0.06 % nasal spray Place 2 sprays into both nostrils 4 (four) times daily. 15 mL 1  . trimethoprim-polymyxin b (POLYTRIM) ophthalmic solution Place 2 drops into both eyes every 4 (four) hours. 10 mL 0   No Known Allergies   Exam:  BP 144/95 mmHg  Pulse 68  Temp(Src) 97 F (36.1 C) (Oral)  Resp 18  SpO2 98% Gen: Well NAD HEENT: EOMI,  MMM injected right conjunctiva with discharge present. Mild left-sided conjunctiva injection. Normal posterior pharynx and tympanic membranes Lungs: Normal work of breathing. CTABL Heart: RRR no MRG Abd: NABS, Soft. Nondistended, Nontender Exts: Brisk capillary refill, warm and well perfused.   No results found for this or any previous visit (from  the past 24 hour(s)). No results found.  Assessment and Plan: 58 y.o. male with viral URI and conjunctivitis. Treatment with codeine containing cough medication Atrovent nasal spray and Polytrim eyedrops. Work note provided.  Discussed warning signs or symptoms. Please see discharge instructions. Patient expresses understanding.     Gregor Hams, MD 01/28/14 [phone]

## 2014-04-04 ENCOUNTER — Emergency Department (HOSPITAL_COMMUNITY)
Admission: EM | Admit: 2014-04-04 | Discharge: 2014-04-04 | Disposition: A | Discharge status: Home / Self Care | Payer: 59 | Attending: Emergency Medicine | Admitting: Emergency Medicine

## 2014-04-04 ENCOUNTER — Encounter (HOSPITAL_COMMUNITY): Payer: Self-pay | Admitting: Emergency Medicine

## 2014-04-04 DIAGNOSIS — E119 Type 2 diabetes mellitus without complications: Secondary | ICD-10-CM | POA: Diagnosis not present

## 2014-04-04 DIAGNOSIS — L03012 Cellulitis of left finger: Secondary | ICD-10-CM | POA: Diagnosis not present

## 2014-04-04 DIAGNOSIS — IMO0001 Reserved for inherently not codable concepts without codable children: Secondary | ICD-10-CM

## 2014-04-04 DIAGNOSIS — M79642 Pain in left hand: Secondary | ICD-10-CM | POA: Diagnosis present

## 2014-04-04 HISTORY — DX: Type 2 diabetes mellitus without complications: E11.9

## 2014-04-04 MED ORDER — CEPHALEXIN 500 MG PO CAPS: 500.0000 mg | ORAL_CAPSULE | Freq: Four times a day (QID) | ORAL | Status: DC

## 2014-04-04 MED ORDER — BUPIVACAINE HCL (PF) 0.5 % IJ SOLN
10.0000 mL | Freq: Once | INTRAMUSCULAR | Status: AC
  Administered 2014-04-04: 10 mL
  Filled 2014-04-04: qty 10

## 2014-04-04 MED ORDER — IBUPROFEN 600 MG PO TABS: 600.0000 mg | ORAL_TABLET | Freq: Four times a day (QID) | ORAL | Status: DC | PRN

## 2014-04-04 NOTE — Discharge Instructions (Signed)
Paronychia Paronychia is an inflammatory reaction involving the folds of the skin surrounding the fingernail. This is commonly caused by an infection in the skin around a nail. The most common cause of paronychia is frequent wetting of the hands (as seen with bartenders, food servers, nurses or others who wet their hands). This makes the skin around the fingernail susceptible to infection by bacteria (germs) or fungus. Other predisposing factors are:  Aggressive manicuring.  Nail biting.  Thumb sucking. The most common cause is a staphylococcal (a type of germ) infection, or a fungal (Candida) infection. When caused by a germ, it usually comes on suddenly with redness, swelling, pus and is often painful. It may get under the nail and form an abscess (collection of pus), or form an abscess around the nail. If the nail itself is infected with a fungus, the treatment is usually prolonged and may require oral medicine for up to one year. Your caregiver will determine the length of time treatment is required. The paronychia caused by bacteria (germs) may largely be avoided by not pulling on hangnails or picking at cuticles. When the infection occurs at the tips of the finger it is called felon. When the cause of paronychia is from the herpes simplex virus (HSV) it is called herpetic whitlow. TREATMENT  When an abscess is present treatment is often incision and drainage. This means that the abscess must be cut open so the pus can get out. When this is done, the following home care instructions should be followed. HOME CARE INSTRUCTIONS   It is important to keep the affected fingers very dry. Rubber or plastic gloves over cotton gloves should be used whenever the hand must be placed in water.  Keep wound clean, dry and dressed as suggested by your caregiver between warm soaks or warm compresses.  Soak in warm water for fifteen to twenty minutes three to four times per day for bacterial infections. Fungal  infections are very difficult to treat, so often require treatment for long periods of time.  For bacterial (germ) infections take antibiotics (medicine which kill germs) as directed and finish the prescription, even if the problem appears to be solved before the medicine is gone.  Only take over-the-counter or prescription medicines for pain, discomfort, or fever as directed by your caregiver. SEEK IMMEDIATE MEDICAL CARE IF:  You have redness, swelling, or increasing pain in the wound.  You notice pus coming from the wound.  You have a fever.  You notice a bad smell coming from the wound or dressing. Document Released: 08/25/2000 Document Revised: 05/24/2011 Document Reviewed: 04/26/2008 [facility] Patient Information 2015 [place], [place]. This information is not intended to replace advice given to you by your health care provider. Make sure you discuss any questions you have with your health care provider.  Paroniquia (Paronychia) La paroniquia es una reaccin inflamatoria que involucra los pliegues de la piel que rodea la ua. Generalmente se debe a una infeccin en la piel que rodea la ua. La causa ms comn es el lavado frecuente de las manos (como en el caso de los [place], mozos, enfermeros y Scientist, research (medical) personas que necesitan mojarse las manos. Esto hace que la piel que rodea la ua sea susceptible a las infecciones por bacterias (grmenes) u hongos. Otros factores que predisponen son:  Lucrezia Starch agresivos.  Morderse las uas.  Succionar Counselling psychologist. La causa ms comn es una infeccin por estafilococo (un germen) o una infeccin por hongos (candida). Cuando la causa es un  germen, generalmente el comienzo es sbito y doloroso con enrojecimiento, hinchazn, pus y Social research officer, government. Puede aparecer bajo la ua y formar un absceso (acumulacin de pus) o formar un absceso alrededor de la ua. Si la ua est infectada con un hongo, el tratamiento es prolongado y puede requerir medicamentos por va  oral durante un ao. El mdico decidir la cantidad de tiempo que requerir Dispensing optician. La paroniquia causada por bacterias (grmenes) puede evitarse si no se quitan los padrastros o se cortan las cutculas. Cuando la infeccin ocurre en la punta del dedo se denomina panadizo. Si la causa es el virus del herpes simplex, se denomina panadizo herptico. TRATAMIENTO El tratamiento consiste en la incisin y el drenaje cuando hay un absceso. Esto significa que el absceso debe abrirse para que el pus pueda salir. Debe seguir los cuidados que se recomiendan a continuacin. INSTRUCCIONES PARA EL CUIDADO DOMICILIARIO  Es importante mantener las reas afectadas limpias y secas. Use guantes de goma o plstico sobre guantes de algodn cuando deba mojarse la [place].  Entre los perodos de enjuagues con agua tibia, mantenga la herida limpia, seca y vendada como se lo indic el profesional que lo asiste.  Si tiene una infeccin bacteriana, sumerja la mano en agua tibia entre 15 y 17 minutos, tres o cuatro Charity fundraiser. Las infecciones fngicas son muy difciles de tratar, por lo tanto requieren tratamiento durante largos perodos.  Tome los antibiticos (medicamentos que [employer] grmenes) para las infecciones bacterianas, segn las indicaciones. Termine todos los [employer], an si el problema parece estar resuelto antes de finalizarlos.  Utilice los medicamentos de venta libre o de prescripcin para Conservation officer, historic buildings, Health and safety inspector o la [place], segn se lo indique el profesional que lo asiste. SOLICITE ATENCIN MDICA DE INMEDIATO SI:  Presenta enrojecimiento, hinchazn o aumento del dolor en la herida.  Aparece pus en la herida.  Tiene fiebre.  Advierte un olor ftido que proviene de la herida o del vendaje. Document Released: 12/09/2004 Document Revised: 05/24/2011 [facility] Patient Information 2015 [place]. This information is not intended to replace advice given to you by your health care  provider. Make sure you discuss any questions you have with your health care provider.

## 2014-04-04 NOTE — ED Notes (Signed)
Swelling to left 3rd digit. Denies drainage. Sensation intact.

## 2014-04-04 NOTE — ED Notes (Signed)
Pt reports left middle finger pain x 2-3 days. Finger is swollen and tender to touch. Denies fever/chills. sts it may have started after cutting his nails.

## 2014-04-04 NOTE — ED Provider Notes (Signed)
CSN: [id number]     Arrival date & time 04/04/14  1602 History  This chart was scribed for non-physician practitioner, Dahlia Bailiff, PA-C, working with Leota Jacobsen, MD by Lowella Petties, ED Scribe. The patient was seen in room TR09C/TR09C. Patient's care was started at 4:30 PM.      Chief Complaint  Patient presents with  . Hand Pain   The history is provided by the patient. No language interpreter was used.   HPI Comments: James Garcia is a 59 y.o. male with a history of DM who presents to the Emergency Department complaining of constant, throbbing, left third finger pain which began 2-3 days ago. He states that it may have started after he was cutting his nails. He reports associated redness. He denies fever and chills. He denies any allergies.   Past Medical History  Diagnosis Date  . Diabetes mellitus without complication    Past Surgical History  Procedure Laterality Date  . Arm surgery     No family history on file. History  Substance Use Topics  . Smoking status: Never Smoker   . Smokeless tobacco: Not on file  . Alcohol Use: No    Review of Systems  Constitutional: Negative for fever and chills.  Gastrointestinal: Negative for nausea and vomiting.  Skin: Positive for wound (left third finger).  Neurological: Negative for numbness.    Allergies  Review of patient's allergies indicates no known allergies.  Home Medications   Prior to Admission medications   Medication Sig Start Date End Date Taking? Authorizing Provider  cephALEXin (KEFLEX) 500 MG capsule Take 1 capsule (500 mg total) by mouth 4 (four) times daily. 04/04/14   Carrie Mew, PA-C  ibuprofen (ADVIL,MOTRIN) 600 MG tablet Take 1 tablet (600 mg total) by mouth every 6 (six) hours as needed. 04/04/14   Carrie Mew, PA-C   Triage Vitals: BP 142/89 mmHg  Pulse 79  Temp(Src) 97.6 F (36.4 C) (Oral)  Resp 16  Ht 6\' 1"  (1.854 m)  Wt 280 lb (127.007 kg)  BMI 36.95 kg/m2  SpO2 95% Physical Exam   Constitutional: He is oriented to person, place, and time. He appears well-developed and well-nourished. No distress.  HENT:  Head: Normocephalic and atraumatic.  Eyes: Conjunctivae and EOM are normal. Pupils are equal, round, and reactive to light.  Neck: Neck supple. No tracheal deviation present.  Cardiovascular: Normal rate.   Pulmonary/Chest: Effort normal. No respiratory distress.  Musculoskeletal: Normal range of motion.  Neurological: He is alert and oriented to person, place, and time.  Skin: Skin is warm and dry.  Perionychia of the left long finger on the medial boarder.   Psychiatric: He has a normal mood and affect. His behavior is normal.  Nursing note and vitals reviewed.   ED Course  INCISION AND DRAINAGE Date/Time: 04/04/2014 5:06 PM Performed by: Carrie Mew Authorized by: Carrie Mew Consent: Verbal consent obtained. Risks and benefits: risks, benefits and alternatives were discussed Consent given by: patient Patient identity confirmed: verbally with patient Type: abscess Body area: upper extremity Location details: left long finger Anesthesia: digital block Local anesthetic: bupivacaine 0.5% without epinephrine Anesthetic total: 5 ml Patient sedated: no Scalpel size: 11 Incision type: single straight Complexity: simple Drainage: purulent Drainage amount: scant Wound treatment: wound left open Patient tolerance: Patient tolerated the procedure well with no immediate complications Comments: Paronychia    (including critical care time) DIAGNOSTIC STUDIES: Oxygen Saturation is 95% on room air, adequate by  my interpretation.    COORDINATION OF CARE: 4:36 PM-Discussed treatment plan which includes I&D and ABX with pt at bedside and pt agreed to plan.   Labs Review Labs Reviewed - No data to display  Imaging Review No results found.   EKG Interpretation None      MDM   Final diagnoses:  Paronychia of third finger of left hand     Patient with paronychia amenable to incision and drainage.  Abscess was not large enough to warrant packing or drain,  wound recheck in 2 days. Encouraged home warm soaks and flushing.  Mild signs of cellulitis is surrounding skin.  Will d/c to home.  Antibiotic therapy given based on patient's history of diabetes.  I personally performed the services described in this documentation, which was scribed in my presence. The recorded information has been reviewed and is accurate.  BP 147/90 mmHg  Pulse 75  Temp(Src) 98.1 F (36.7 C) (Oral)  Resp 12  Ht 6\' 1"  (1.854 m)  Wt 280 lb (127.007 kg)  BMI 36.95 kg/m2  SpO2 93%  Signed,  Dahlia Bailiff, PA-C 12:52 AM    Carrie Mew, PA-C 04/05/14 [id number]  Leota Jacobsen, MD 04/07/14 [phone]

## 2014-04-08 ENCOUNTER — Encounter (HOSPITAL_COMMUNITY): Payer: Self-pay | Admitting: Emergency Medicine

## 2014-05-09 ENCOUNTER — Telehealth: Payer: Self-pay | Admitting: Internal Medicine

## 2014-05-09 NOTE — Telephone Encounter (Signed)
Pt's wife confirmed appt on 06/04/14 w/Mohamed Dx:  Chronic thrombocytopenia Referring : Dr. Montez Morita

## 2014-05-10 ENCOUNTER — Telehealth: Payer: Self-pay | Admitting: Internal Medicine

## 2014-05-10 NOTE — Telephone Encounter (Signed)
Chart delivered on 05/10/14.  TG

## 2014-05-13 ENCOUNTER — Telehealth: Payer: Self-pay | Admitting: Internal Medicine

## 2014-05-13 NOTE — Telephone Encounter (Signed)
Chart delivered 05/13/14

## 2014-06-04 ENCOUNTER — Other Ambulatory Visit: Payer: Self-pay | Admitting: Internal Medicine

## 2014-06-04 ENCOUNTER — Encounter: Payer: Self-pay | Admitting: Internal Medicine

## 2014-06-04 ENCOUNTER — Ambulatory Visit (HOSPITAL_BASED_OUTPATIENT_CLINIC_OR_DEPARTMENT_OTHER): Payer: 59 | Admitting: Internal Medicine

## 2014-06-04 ENCOUNTER — Ambulatory Visit: Payer: 59

## 2014-06-04 ENCOUNTER — Other Ambulatory Visit (HOSPITAL_BASED_OUTPATIENT_CLINIC_OR_DEPARTMENT_OTHER): Payer: 59

## 2014-06-04 VITALS — BP 155/80 | HR 80 | Temp 98.0°F | Resp 19 | Ht 72.0 in | Wt 259.2 lb

## 2014-06-04 DIAGNOSIS — D696 Thrombocytopenia, unspecified: Secondary | ICD-10-CM

## 2014-06-04 LAB — CBC WITH DIFFERENTIAL/PLATELET
BASO%: 0.5 % (ref 0.0–2.0)
Basophils Absolute: 0 10*3/uL (ref 0.0–0.1)
EOS%: 4.8 % (ref 0.0–7.0)
Eosinophils Absolute: 0.3 10*3/uL (ref 0.0–0.5)
HEMATOCRIT: 46.5 % (ref 38.4–49.9)
HGB: 16.4 g/dL (ref 13.0–17.1)
LYMPH#: 2.4 10*3/uL (ref 0.9–3.3)
LYMPH%: 36.5 % (ref 14.0–49.0)
MCH: 30.5 pg (ref 27.2–33.4)
MCHC: 35.3 g/dL (ref 32.0–36.0)
MCV: 86.4 fL (ref 79.3–98.0)
MONO#: 0.5 10*3/uL (ref 0.1–0.9)
MONO%: 7.9 % (ref 0.0–14.0)
NEUT#: 3.2 10*3/uL (ref 1.5–6.5)
NEUT%: 50.3 % (ref 39.0–75.0)
Platelets: 131 10*3/uL — ABNORMAL LOW (ref 140–400)
RBC: 5.38 10*6/uL (ref 4.20–5.82)
RDW: 12.6 % (ref 11.0–14.6)
WBC: 6.4 10*3/uL (ref 4.0–10.3)

## 2014-06-04 LAB — COMPREHENSIVE METABOLIC PANEL (CC13)
ALT: 20 U/L (ref 0–55)
AST: 11 U/L (ref 5–34)
Albumin: 3.8 g/dL (ref 3.5–5.0)
Alkaline Phosphatase: 77 U/L (ref 40–150)
Anion Gap: 10 mEq/L (ref 3–11)
BUN: 16.1 mg/dL (ref 7.0–26.0)
CO2: 26 meq/L (ref 22–29)
Calcium: 8.9 mg/dL (ref 8.4–10.4)
Chloride: 102 mEq/L (ref 98–109)
Creatinine: 0.8 mg/dL (ref 0.7–1.3)
EGFR: >90 mL/min/{1.73_m2} (ref >90)
Glucose: 245 mg/dl — ABNORMAL HIGH (ref 70–140)
POTASSIUM: 4 meq/L (ref 3.5–5.1)
Sodium: 138 mEq/L (ref 136–145)
TOTAL PROTEIN: 6.8 g/dL (ref 6.4–8.3)
Total Bilirubin: 0.65 mg/dL (ref 0.20–1.20)

## 2014-06-04 LAB — LACTATE DEHYDROGENASE (CC13): LDH: 153 U/L (ref 125–245)

## 2014-06-04 NOTE — Progress Notes (Signed)
Checked in new pt with no financial concerns. °

## 2014-06-04 NOTE — Progress Notes (Signed)
[place] Telephone:([phone]) [phone]   Fax:(336) [phone]  CONSULT NOTE  REFERRING PHYSICIAN: Dr. Awanda Mink Spruill  REASON FOR CONSULTATION:  59 years old Hispanic male with thrombocytopenia  HPI James Garcia is a 59 y.o. male with past medical history significant for diabetes mellitus, hypertension as well as history of treated tuberculosis. The patient was seen by his primary care physician Dr. Montez Morita recently for routine evaluation and CBC performed on 05/01/2014 showed low platelets count of 129,000. Dr.Spruill referred him to me today for evaluation and recommendation regarding his low platelets count.The patient mentions that he has this problem for years and it has been fluctuating between normal and below normal platelets count over the last several years. The patient denied having any significant bleeding issues, bruises or ecchymosis. He takes NSAIDs only on as-needed basis for arthritis but no other over the counter medications. He has no family history of blood disorders or thrombocytopenia. He is feeling fine today with no specific complaints. He denied having any significant chest pain, shortness breath, cough or hemoptysis. The patient denied having any significant weight loss, night sweats. He denied having any significant nausea or vomiting, no fever or chills. Family history significant for mother who died from natural causes at old age and father had diabetes mellitus. The patient is married and has 2 children. 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 He has no history of smoking, alcohol or drug abuse.  HPI  Past Medical History  Diagnosis Date  . DM type 2 (diabetes mellitus, type 2)   . 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 Diabetes mellitus without complication     Past Surgical History  Procedure Laterality Date  . Arm surgery      Family History  Problem Relation Age of Onset  . Family history unknown: Yes    Social  History History  Substance Use Topics  . Smoking status: Former Smoker -- 0.25 packs/day for 5 years    Types: Cigarettes    Quit date: 06/04/1978  . Smokeless tobacco: Not on file  . Alcohol Use: No    No Known Allergies  Current Outpatient Prescriptions  Medication Sig Dispense Refill  . sitaGLIPtin-metformin (JANUMET) 50-500 MG per tablet Take 1 tablet by mouth 2 (two) times daily with a meal.     No current facility-administered medications for this visit.    Review of Systems  Constitutional: negative Eyes: negative Ears, nose, mouth, throat, and face: negative Respiratory: negative Cardiovascular: negative Gastrointestinal: negative Genitourinary:negative Integument/breast: negative Hematologic/lymphatic: negative Musculoskeletal:negative Neurological: negative Behavioral/Psych: negative Endocrine: negative Allergic/Immunologic: negative  Physical Exam  JAS:NKNLZ, healthy, no distress, well nourished and well developed SKIN: skin color, texture, turgor are normal, no rashes or significant lesions HEAD: Normocephalic, No masses, lesions, tenderness or abnormalities EYES: normal, PERRLA EARS: External ears normal, Canals clear OROPHARYNX:no exudate, no erythema and lips, buccal mucosa, and tongue normal  NECK: supple, no adenopathy, no JVD LYMPH:  no palpable lymphadenopathy, no hepatosplenomegaly LUNGS: clear to auscultation , and palpation HEART: regular rate & rhythm, no murmurs and no gallops ABDOMEN:abdomen soft, non-tender, obese, normal bowel sounds and no masses or organomegaly BACK: Back symmetric, no curvature., No CVA tenderness EXTREMITIES:no joint deformities, effusion, or inflammation, no edema, no skin discoloration  NEURO: alert & oriented x 3 with fluent speech, no focal motor/sensory deficits  PERFORMANCE STATUS: ECOG 0  LABORATORY DATA: Lab Results  Component Value Date   WBC 6.4 06/04/2014   HGB 16.4 06/04/2014  HCT 46.5 06/04/2014    MCV 86.4 06/04/2014   PLT 131* 06/04/2014      Chemistry      Component Value Date/Time   NA 138 06/04/2014 1112   NA 135 01/14/2007 0300   K 4.0 06/04/2014 1112   K 3.2* 01/14/2007 0300   CL 99 01/14/2007 0300   CO2 26 06/04/2014 1112   CO2 27 01/14/2007 0300   BUN 16.1 06/04/2014 1112   BUN 8 01/14/2007 0300   CREATININE 0.8 06/04/2014 1112   CREATININE 0.91 01/14/2007 0300      Component Value Date/Time   CALCIUM 8.9 06/04/2014 1112   CALCIUM 8.2* 01/14/2007 0300   ALKPHOS 77 06/04/2014 1112   AST 11 06/04/2014 1112   ALT 20 06/04/2014 1112   BILITOT 0.65 06/04/2014 1112       RADIOGRAPHIC STUDIES: No results found.  ASSESSMENT: This is a very pleasant 59 years old Hispanic male who came today for evaluation of thrombocytopenia. This is very mild thrombocytopenia questionable to be idiopathic thrombocytopenic purpura versus drug-induced. The patient is currently asymptomatic with no significant bleeding history.   PLAN: I had a lengthy discussion with the patient and his wife today about his condition. I explained to the patient that there is no need for further studies as his platelets count has been just below the normal range. Repeat CBC today showed platelets count over 131,000. I recommended for the patient to continue her routine observation and follow-up visit with his primary care physician. I would be happy to see him again for further evaluation if his platelets count are less than 50,000 or if the patient has any bleeding issues. The patient his wife agreed to the current plan. He was advised to call if he has any concerning symptoms.  The patient voices understanding of current disease status and treatment options and is in agreement with the current care plan.  All questions were answered. The patient knows to call the clinic with any problems, questions or concerns. We can certainly see the patient much sooner if necessary.  Thank you so much for  allowing me to participate in the care of James Garcia. I will continue to follow up the patient with you and assist in his care.  I spent 30 minutes counseling the patient face to face. The total time spent in the appointment was 55 minutes.  Disclaimer: This note was dictated with voice recognition software. Similar sounding words can inadvertently be transcribed and may not be corrected upon review.   Gia Lusher K. June 04, 2014, 12:14 PM

## 2015-03-26 DIAGNOSIS — E118 Type 2 diabetes mellitus with unspecified complications: Secondary | ICD-10-CM | POA: Diagnosis not present

## 2015-03-26 DIAGNOSIS — E119 Type 2 diabetes mellitus without complications: Secondary | ICD-10-CM | POA: Diagnosis not present

## 2015-03-26 DIAGNOSIS — R5383 Other fatigue: Secondary | ICD-10-CM | POA: Diagnosis not present

## 2015-03-26 MED FILL — ACCU-CHEK AVIVA PLUS TEST S: 50 days supply | Qty: 100 | Fill #0

## 2015-03-26 MED FILL — AMOX TR-K CLV 875-125 MG TA: 875-125 | 10 days supply | Qty: 20 | Fill #0

## 2015-03-27 MED FILL — JANUMET XR 50-1,000 MG TAB: 50-1000 | 60 days supply | Qty: 120 | Fill #1

## 2015-04-15 ENCOUNTER — Ambulatory Visit (INDEPENDENT_AMBULATORY_CARE_PROVIDER_SITE_OTHER): Payer: 59 | Admitting: Endocrinology

## 2015-04-15 ENCOUNTER — Encounter: Payer: Self-pay | Admitting: Endocrinology

## 2015-04-15 VITALS — BP 132/84 | HR 64 | Temp 97.9°F | Ht 72.0 in | Wt 257.0 lb

## 2015-04-15 DIAGNOSIS — E119 Type 2 diabetes mellitus without complications: Secondary | ICD-10-CM

## 2015-04-15 LAB — POCT GLYCOSYLATED HEMOGLOBIN (HGB A1C): Hemoglobin A1C: 10.5

## 2015-04-15 MED ORDER — INSULIN GLARGINE 300 UNIT/ML ~~LOC~~ SOPN: 15.0000 [IU] | PEN_INJECTOR | SUBCUTANEOUS | Status: DC

## 2015-04-15 NOTE — Progress Notes (Signed)
Subjective:    Patient ID: James Garcia, male    DOB: [birth year], 60 y.o.   MRN: [id number]  HPI pt states DM was dx'ed in 1007; he has mild if any neuropathy of the lower extremities; he is unaware of any associated chronic complications; he has been on insulin since 2016; pt says his diet and exercise are not good; he has never had pancreatitis, severe hypoglycemia or DKA.  He stopped lantus, due to blurry vision.  He has not seen opthal in several years.  He works 2nd shift Past Medical History  Diagnosis Date  . DM type 2 (diabetes mellitus, type 2) ([place])   . 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 Diabetes mellitus without complication [facility])     Past Surgical History  Procedure Laterality Date  . Arm surgery      Social History   Social History  . Marital Status: Single    Spouse Name: N/A  . Number of Children: N/A  . Years of Education: N/A   Occupational History  . Floor Tech at [employer]     Social History Main Topics  . Smoking status: Former Smoker -- 0.25 packs/day for 5 years    Types: Cigarettes    Quit date: 06/04/1978  . Smokeless tobacco: Not on file  . Alcohol Use: No  . Drug Use: No  . Sexual Activity: Not on file   Other Topics Concern  . Not on file   Social History Narrative   ** Merged History Encounter **        Current Outpatient Prescriptions on File Prior to Visit  Medication Sig Dispense Refill  . sitaGLIPtin-metformin (JANUMET) 50-500 MG per tablet Take 1 tablet by mouth 2 (two) times daily with a meal.     No current facility-administered medications on file prior to visit.    No Known Allergies  Family History  Problem Relation Age of Onset  . Diabetes Father     BP 132/84 mmHg  Pulse 64  Temp(Src) 97.9 F (36.6 C) (Oral)  Ht 6' (1.829 m)  Wt 257 lb (116.574 kg)  BMI 34.85 kg/m2  SpO2 96%   Review of Systems denies weight loss, headache, chest pain, sob, n/v, urinary frequency, excessive diaphoresis, depression, cold  intolerance, rhinorrhea, and easy bruising.  He has intermittent leg cramps.       Objective:   Physical Exam VS: see vs page GEN: no distress HEAD: head: no deformity eyes: no periorbital swelling, no proptosis external nose and ears are normal mouth: no lesion seen NECK: supple, thyroid is not enlarged CHEST WALL: no deformity LUNGS: clear to auscultation BREASTS:  No gynecomastia CV: reg rate and rhythm, no murmur ABD: abdomen is soft, nontender.  no hepatosplenomegaly.  not distended.  no hernia MUSCULOSKELETAL: muscle bulk and strength are grossly normal.  no obvious joint swelling.  gait is normal and steady EXTEMITIES: no deformity.  no ulcer on the feet.  feet are of normal color and temp.  no edema PULSES: dorsalis pedis intact bilat.  no carotid bruit NEURO:  cn 2-12 grossly intact.   readily moves all 4's.  sensation is intact to touch on the feet SKIN:  Normal texture and temperature.  No rash or suspicious lesion is visible.   NODES:  None palpable at the neck.   PSYCH: alert, well-oriented.  Does not appear anxious nor depressed.    I personally reviewed electrocardiogram tracing (today): Indication: DM Impression: Nonspecific T-abnormality.   Lab  Results  Component Value Date   HGBA1C 10.5 04/15/2015   I have reviewed outside records, and summarized: Pt was noted to have severely elevated a1c, and referred here.     Assessment & Plan:  DM: severe exacerbation Blurry vision: new.  prob due to the glucose lowering effect of the insulin.  Patient is advised the following: Patient Instructions  good diet and exercise significantly improve the control of your diabetes.  please let me know if you wish to be referred to a dietician.  high blood sugar is very risky to your health.  you should see an eye doctor and dentist every year.  It is very important to get all recommended vaccinations.  controlling your blood pressure and cholesterol drastically reduces the  damage diabetes does to your body.  Those who smoke should quit.  please discuss these with your doctor.  check your blood sugar twice a day.  vary the time of day when you check, between before the 3 meals, and at bedtime.  also check if you have symptoms of your blood sugar being too high or too low.  please keep a record of the readings and bring it to your next appointment here (or you can bring the meter itself).  You can write it on any piece of paper.  please call us sooner if your blood sugar goes below 70, or if you have a lot of readings over 200. Please resume the toujeo.  i have sent a prescription to your pharmacy. The blurry vision is probably due to the lowering of the blood sugar by the insulin.  This gets better with time.  Until then, you should buy some reading glasses at the dollar store.   Please come back for a follow-up appointment in 2 weeks.

## 2015-04-15 NOTE — Patient Instructions (Addendum)
good diet and exercise significantly improve the control of your diabetes.  please let me know if you wish to be referred to a dietician.  high blood sugar is very risky to your health.  you should see an eye doctor and dentist every year.  It is very important to get all recommended vaccinations.  controlling your blood pressure and cholesterol drastically reduces the damage diabetes does to your body.  Those who smoke should quit.  please discuss these with your doctor.  check your blood sugar twice a day.  vary the time of day when you check, between before the 3 meals, and at bedtime.  also check if you have symptoms of your blood sugar being too high or too low.  please keep a record of the readings and bring it to your next appointment here (or you can bring the meter itself).  You can write it on any piece of paper.  please call us sooner if your blood sugar goes below 70, or if you have a lot of readings over 200. Please resume the toujeo.  i have sent a prescription to your pharmacy. The blurry vision is probably due to the lowering of the blood sugar by the insulin.  This gets better with time.  Until then, you should buy some reading glasses at the dollar store.   Please come back for a follow-up appointment in 2 weeks.

## 2015-04-22 MED FILL — UNIFINE PENTIPS 8MM 31G: 31G X 8 MM | 90 days supply | Qty: 100 | Fill #1

## 2015-04-30 ENCOUNTER — Ambulatory Visit (INDEPENDENT_AMBULATORY_CARE_PROVIDER_SITE_OTHER): Payer: 59 | Admitting: Endocrinology

## 2015-04-30 ENCOUNTER — Encounter: Payer: Self-pay | Admitting: Endocrinology

## 2015-04-30 VITALS — BP 122/86 | HR 63 | Temp 98.0°F | Ht 72.0 in | Wt 257.0 lb

## 2015-04-30 DIAGNOSIS — Z794 Long term (current) use of insulin: Secondary | ICD-10-CM

## 2015-04-30 DIAGNOSIS — E119 Type 2 diabetes mellitus without complications: Secondary | ICD-10-CM | POA: Diagnosis not present

## 2015-04-30 MED ORDER — INSULIN GLARGINE 300 UNIT/ML ~~LOC~~ SOPN: 20.0000 [IU] | PEN_INJECTOR | SUBCUTANEOUS | Status: DC

## 2015-04-30 NOTE — Progress Notes (Signed)
   Subjective:    Patient ID: James Garcia, male    DOB: 03-Jul-1955, 60 y.o.   MRN: [id number]  HPI Pt returns for f/u of diabetes mellitus: DM type: Insulin-requiring type 2 Dx'ed: [id number] Complications: none Therapy: insulin since 2016 DKA: never Severe hypoglycemia: never Pancreatitis: never Other: he declines multiple daily injections; he works 2nd shift.  Interval history: no cbg record, but states cbg's are in the mid to high-100's.  pt states he feels well in general. Past Medical History  Diagnosis Date  . DM type 2 (diabetes mellitus, type 2) ([place])   . 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 Diabetes mellitus without complication [facility])     Past Surgical History  Procedure Laterality Date  . Arm surgery      Social History   Social History  . Marital Status: Single    Spouse Name: N/A  . Number of Children: N/A  . 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 Smoking status: Former Smoker -- 0.25 packs/day for 5 years    Types: Cigarettes    Quit date: 06/04/1978  . Smokeless tobacco: Not on file  . Alcohol Use: No  . Drug Use: No  . Sexual Activity: Not on file   Other Topics Concern  . Not on file   Social History Narrative   ** Merged History Encounter **        Current Outpatient Prescriptions on File Prior to Visit  Medication Sig Dispense Refill  . sitaGLIPtin-metformin (JANUMET) 50-500 MG per tablet Take 1 tablet by mouth 2 (two) times daily with a meal.     No current facility-administered medications on file prior to visit.    No Known Allergies  Family History  Problem Relation Age of Onset  . Diabetes Father     BP 122/86 mmHg  Pulse 63  Temp(Src) 98 F (36.7 C) (Oral)  Ht 6' (1.829 m)  Wt 257 lb (116.574 kg)  BMI 34.85 kg/m2  SpO2 97%  Review of Systems Blurry vision is resolved    Objective:   Physical Exam VITAL SIGNS:  See vs page.   GENERAL: no distress.    SKIN:  Insulin injection sites at the anterior thighs are normal, except for 1 ecchymosis.    Lab Results  Component Value Date   HGBA1C 10.5 04/15/2015      Assessment & Plan:  DM: he needs increased rx. he wants to continue toujeo + janumet, at least for now.  Patient is advised the following: Patient Instructions  check your blood sugar twice a day.  vary the time of day when you check, between before the 3 meals, and at bedtime.  also check if you have symptoms of your blood sugar being too high or too low.  please keep a record of the readings and bring it to your next appointment here (or you can bring the meter itself).  You can write it on any piece of paper.  please call us sooner if your blood sugar goes below 70, or if you have a lot of readings over 200. Please increase the toujeo to 20 units daily.   Please continue the same janumet.  Please come back for a follow-up appointment in 1 month.

## 2015-04-30 NOTE — Patient Instructions (Addendum)
check your blood sugar twice a day.  vary the time of day when you check, between before the 3 meals, and at bedtime.  also check if you have symptoms of your blood sugar being too high or too low.  please keep a record of the readings and bring it to your next appointment here (or you can bring the meter itself).  You can write it on any piece of paper.  please call us sooner if your blood sugar goes below 70, or if you have a lot of readings over 200. Please increase the toujeo to 20 units daily.   Please continue the same janumet.  Please come back for a follow-up appointment in 1 month.

## 2015-05-01 DIAGNOSIS — E785 Hyperlipidemia, unspecified: Secondary | ICD-10-CM | POA: Insufficient documentation

## 2015-05-01 DIAGNOSIS — E1169 Type 2 diabetes mellitus with other specified complication: Secondary | ICD-10-CM | POA: Insufficient documentation

## 2015-05-01 DIAGNOSIS — E119 Type 2 diabetes mellitus without complications: Secondary | ICD-10-CM | POA: Insufficient documentation

## 2015-05-21 MED FILL — JANUMET XR 50-1,000 MG TAB: 50-1000 | 90 days supply | Qty: 180 | Fill #0

## 2015-05-27 NOTE — Progress Notes (Signed)
   Subjective:    Patient ID: James Garcia, male    DOB: 18-Apr-1955, 60 y.o.   MRN: [id number]  HPI Pt returns for f/u of diabetes mellitus: DM type: Insulin-requiring type 2 Dx'ed: [id number] Complications: none Therapy: insulin since 2016 DKA: never Severe hypoglycemia: never Pancreatitis: never Other: he declines multiple daily injections; he works 2nd shift.  Interval history: no cbg record, but states cbg's are in the mid to high-100's.  pt states he feels well in general.   Review of Systems     Objective:   Physical Exam        Assessment & Plan:   This encounter was created in error - please disregard.

## 2015-05-27 NOTE — Patient Instructions (Signed)
check your blood sugar twice a day.  vary the time of day when you check, between before the 3 meals, and at bedtime.  also check if you have symptoms of your blood sugar being too high or too low.  please keep a record of the readings and bring it to your next appointment here (or you can bring the meter itself).  You can write it on any piece of paper.  please call us sooner if your blood sugar goes below 70, or if you have a lot of readings over 200. Please increase the toujeo to 20 units daily.   Please continue the same janumet.  Please come back for a follow-up appointment in 1 month.

## 2015-05-28 ENCOUNTER — Encounter: Payer: Self-pay | Admitting: Endocrinology

## 2015-05-29 ENCOUNTER — Telehealth: Payer: Self-pay | Admitting: Endocrinology

## 2015-05-29 NOTE — Telephone Encounter (Signed)
Called to R/S, no answer

## 2015-05-29 NOTE — Telephone Encounter (Signed)
Please come back for a follow-up appointment in 2 months.    

## 2015-05-29 NOTE — Telephone Encounter (Signed)
Patient no showed today's appt. Please advise on how to follow up. °A. No follow up necessary. °B. Follow up urgent. Contact patient immediately. °C. Follow up necessary. Contact patient and schedule visit in ___ days. °D. Follow up advised. Contact patient and schedule visit in ____weeks. ° °

## 2015-06-17 MED FILL — OMEPRAZOLE DR 20 MG CAPSULE: 20 | 90 days supply | Qty: 90 | Fill #1

## 2015-06-25 DIAGNOSIS — E119 Type 2 diabetes mellitus without complications: Secondary | ICD-10-CM | POA: Diagnosis not present

## 2015-06-25 DIAGNOSIS — I10 Essential (primary) hypertension: Secondary | ICD-10-CM | POA: Diagnosis not present

## 2015-06-25 DIAGNOSIS — R5383 Other fatigue: Secondary | ICD-10-CM | POA: Diagnosis not present

## 2015-06-25 DIAGNOSIS — D473 Essential (hemorrhagic) thrombocythemia: Secondary | ICD-10-CM | POA: Diagnosis not present

## 2015-06-25 DIAGNOSIS — E118 Type 2 diabetes mellitus with unspecified complications: Secondary | ICD-10-CM | POA: Diagnosis not present

## 2015-06-30 MED FILL — ATORVASTATIN 10 MG TABLET: 10 | 30 days supply | Qty: 30 | Fill #0

## 2015-06-30 MED FILL — LOSARTAN POTASSIUM 50 MG TA: 50 | 30 days supply | Qty: 30 | Fill #0

## 2015-07-17 MED FILL — AMITIZA 24 MCG CAPSULES: 24 | 30 days supply | Qty: 60 | Fill #0

## 2015-07-17 MED FILL — TOUJEO SOLOSTAR 300 UNITS/M: 300 | 60 days supply | Qty: 5 | Fill #0

## 2015-07-24 MED FILL — UNIFINE PENTIPS 8MM 31G: 31G X 8 MM | 90 days supply | Qty: 100 | Fill #2

## 2015-09-08 MED FILL — JANUMET XR 50-1,000 MG TAB: 50-1000 | 90 days supply | Qty: 180 | Fill #1

## 2015-09-15 MED FILL — TOUJEO SOLOSTAR 300 UNITS/M: 300 | 60 days supply | Qty: 5 | Fill #1

## 2015-11-03 MED FILL — TOUJEO SOLOSTAR 300 UNITS/M: 300 | 60 days supply | Qty: 5 | Fill #2

## 2015-11-03 MED FILL — UNIFINE PENTIPS 8MM 31G: 31G X 8 MM | 90 days supply | Qty: 100 | Fill #3

## 2015-12-08 MED FILL — JANUMET XR 50-1,000 MG TAB: 50-1000 | 90 days supply | Qty: 180 | Fill #2

## 2015-12-29 MED FILL — TOUJEO SOLOSTAR 300 UNITS/M: 300 | 60 days supply | Qty: 5 | Fill #3

## 2015-12-30 MED FILL — OMEPRAZOLE DR 20 MG CAPSULE: 20 | 90 days supply | Qty: 90 | Fill #2

## 2016-02-17 DIAGNOSIS — E118 Type 2 diabetes mellitus with unspecified complications: Secondary | ICD-10-CM | POA: Diagnosis not present

## 2016-02-17 DIAGNOSIS — R5383 Other fatigue: Secondary | ICD-10-CM | POA: Diagnosis not present

## 2016-02-17 DIAGNOSIS — E119 Type 2 diabetes mellitus without complications: Secondary | ICD-10-CM | POA: Diagnosis not present

## 2016-02-17 MED FILL — traMADol HCL 50 MG TABS: 50 | 8 days supply | Qty: 30 | Fill #0

## 2016-02-17 MED FILL — CIPROFLOXACIN HCL 500 MG TA: 500 | 7 days supply | Qty: 14 | Fill #0

## 2016-02-19 ENCOUNTER — Other Ambulatory Visit: Payer: Self-pay | Admitting: Endocrinology

## 2016-02-19 MED FILL — TOUJEO SOLOSTAR 300 UNITS/M: 300 | 67 days supply | Qty: 5 | Fill #0

## 2016-02-23 MED FILL — UNIFINE PENTIPS 8MM 31G: 31G X 8 MM | 90 days supply | Qty: 100 | Fill #0

## 2016-03-29 ENCOUNTER — Other Ambulatory Visit: Payer: Self-pay | Admitting: Occupational Medicine

## 2016-03-29 ENCOUNTER — Ambulatory Visit: Payer: Self-pay

## 2016-03-29 DIAGNOSIS — M25521 Pain in right elbow: Secondary | ICD-10-CM

## 2016-04-12 DIAGNOSIS — E118 Type 2 diabetes mellitus with unspecified complications: Secondary | ICD-10-CM | POA: Diagnosis not present

## 2016-04-12 DIAGNOSIS — E119 Type 2 diabetes mellitus without complications: Secondary | ICD-10-CM | POA: Diagnosis not present

## 2016-04-12 DIAGNOSIS — R5383 Other fatigue: Secondary | ICD-10-CM | POA: Diagnosis not present

## 2016-04-12 MED FILL — LOSARTAN-HCTZ 100-25 MG TAB: 100-25 | 90 days supply | Qty: 90 | Fill #0

## 2016-04-12 MED FILL — ATORVASTATIN 20 MG TABLET: 20 | 90 days supply | Qty: 90 | Fill #0

## 2016-04-12 MED FILL — TOUJEO SOLOSTAR 300 UNITS/M: 300 | 67 days supply | Qty: 5 | Fill #1

## 2016-05-26 ENCOUNTER — Other Ambulatory Visit: Payer: Self-pay | Admitting: Endocrinology

## 2016-05-26 MED FILL — TOUJEO SOLOSTAR 300 UNITS/M: 300 | 45 days supply | Qty: 5 | Fill #0

## 2016-06-01 ENCOUNTER — Other Ambulatory Visit: Payer: Self-pay | Admitting: Endocrinology

## 2016-06-01 ENCOUNTER — Telehealth: Payer: Self-pay | Admitting: Endocrinology

## 2016-06-01 MED ORDER — UNIFINE PENTIPS 31G X 8 MM MISC: 0 refills | Status: DC

## 2016-06-01 MED FILL — UNIFINE PENTIPS 8MM 31G: 31G X 8 MM | 90 days supply | Qty: 100 | Fill #0

## 2016-06-01 NOTE — Telephone Encounter (Signed)
30 day supply submitted pending patient's appointment.

## 2016-06-01 NOTE — Telephone Encounter (Signed)
Pt needs his needles sent to the [facility].  He said he will have to give [place] a call back to schedule an appointment.

## 2016-06-07 ENCOUNTER — Telehealth: Payer: Self-pay

## 2016-06-07 MED ORDER — SITAGLIPTIN PHOS-METFORMIN HCL 50-500 MG PO TABS: 1.0000 | ORAL_TABLET | Freq: Two times a day (BID) | ORAL | 0 refills | Status: DC

## 2016-06-07 MED FILL — JANUMET 50-500 MG TABLET: 50-500 | 30 days supply | Qty: 60 | Fill #0

## 2016-06-07 NOTE — Telephone Encounter (Signed)
Pharmacy notified to dispense Janumet 1 tablet 2 times per day.

## 2016-06-07 NOTE — Telephone Encounter (Signed)
Employee pharmacy needs assistance with the recent rx

## 2016-06-22 ENCOUNTER — Telehealth: Payer: Self-pay | Admitting: Endocrinology

## 2016-06-22 ENCOUNTER — Ambulatory Visit (INDEPENDENT_AMBULATORY_CARE_PROVIDER_SITE_OTHER): Payer: 59 | Admitting: Endocrinology

## 2016-06-22 ENCOUNTER — Encounter: Payer: Self-pay | Admitting: Endocrinology

## 2016-06-22 VITALS — BP 128/86 | HR 75 | Ht 73.0 in | Wt 264.0 lb

## 2016-06-22 DIAGNOSIS — E119 Type 2 diabetes mellitus without complications: Secondary | ICD-10-CM

## 2016-06-22 DIAGNOSIS — Z794 Long term (current) use of insulin: Secondary | ICD-10-CM | POA: Diagnosis not present

## 2016-06-22 LAB — POCT GLYCOSYLATED HEMOGLOBIN (HGB A1C): Hemoglobin A1C: 9.6

## 2016-06-22 MED ORDER — GLUCOSE BLOOD VI STRP: ORAL_STRIP | 2 refills | Status: DC

## 2016-06-22 MED ORDER — ONETOUCH VERIO IQ SYSTEM W/DEVICE KIT: PACK | 2 refills | Status: DC

## 2016-06-22 MED ORDER — ONETOUCH DELICA LANCETS FINE MISC: 2 refills | Status: DC

## 2016-06-22 MED ORDER — INSULIN GLARGINE 300 UNIT/ML ~~LOC~~ SOPN: 40.0000 [IU] | PEN_INJECTOR | SUBCUTANEOUS | 3 refills | Status: DC

## 2016-06-22 MED ORDER — SITAGLIP PHOS-METFORMIN HCL ER 50-1000 MG PO TB24: 2.0000 | ORAL_TABLET | ORAL | 11 refills | Status: DC

## 2016-06-22 NOTE — Telephone Encounter (Signed)
Supplies submitted.

## 2016-06-22 NOTE — Telephone Encounter (Signed)
Can we please call in to [facility] a rx for a new meter and all the supplies for the pt.

## 2016-06-22 NOTE — Patient Instructions (Addendum)
check your blood sugar twice a day.  vary the time of day when you check, between before the 3 meals, and at bedtime.  also check if you have symptoms of your blood sugar being too high or too low.  please keep a record of the readings and bring it to your next appointment here (or you can bring the meter itself).  You can write it on any piece of paper.  please call us sooner if your blood sugar goes below 70, or if you have a lot of readings over 200. Please increase the toujeo to 40 units each morning.   Please continue the same janumet.  Please call us next week, to tell us how the blood sugar is doing.   please call [phone], to get an appointment with a new primary provider. Please come back for a follow-up appointment in 1 month.

## 2016-06-22 NOTE — Progress Notes (Signed)
Subjective:    Patient ID: James Garcia, male    DOB: [birth year], 61 y.o.   MRN: [id number]  HPI Pt returns for f/u of diabetes mellitus: DM type: Insulin-requiring type 2 Dx'ed: 3545 Complications: polyneuropathy Therapy: insulin since 2016 DKA: never Severe hypoglycemia: never Pancreatitis: never Other: he declines multiple daily injections; he works 2nd shift.  Interval history: no cbg record, but states cbg's are in the 200's.  pt states he feels well in general.  It is in general higher as the day goes on.  He takes 30 units qam.   Past Medical History:  Diagnosis Date  . Diabetes mellitus without complication ([place])   . DM type 2 (diabetes mellitus, type 2) ([place])   . 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 arm surgery      Social History   Social History  . Marital status: Single    Spouse name: N/A  . Number of children: N/A  . 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 Smoking status: Former Smoker    Packs/day: 0.25    Years: 5.00    Types: Cigarettes    Quit date: 06/04/1978  . Smokeless tobacco: Never Used  . Alcohol use No  . Drug use: No  . Sexual activity: Not on file   Other Topics Concern  . Not on file   Social History Narrative   ** Merged History Encounter **        Current Outpatient Prescriptions on File Prior to Visit  Medication Sig Dispense Refill  . UNIFINE PENTIPS 31G X 8 MM MISC Use to inject insulin 1 time per day 30 each 0   No current facility-administered medications on file prior to visit.     No Known Allergies  Family History  Problem Relation Age of Onset  . Diabetes Father     BP 128/86   Pulse 75   Ht 6\' 1"  (1.854 m)   Wt 264 lb (119.7 kg)   SpO2 97%   BMI 34.83 kg/m    Review of Systems He denies hypoglycemia.      Objective:   Physical Exam VITAL SIGNS:  See vs page GENERAL: no  distress Pulses: dorsalis pedis intact bilat.   MSK: no deformity of the feet CV: 1+ bilat leg edema Skin:  no ulcer on the feet, but the skin is dry.  normal color and temp on the feet. Neuro: sensation is intact to touch on the feet, but decreased from normal. Ext: There is bilateral onychomycosis of the toenails.   Lab Results  Component Value Date   HGBA1C 9.6 06/22/2016       Assessment & Plan:  Insulin-requiring type 2 DM, with polyneuropathy: he needs increased rx  Patient Instructions  check your blood sugar twice a day.  vary the time of day when you check, between before the 3 meals, and at bedtime.  also check if you have symptoms of your blood sugar being too high or too low.  please keep a record of the readings and bring it to your next appointment here (or you can bring the meter itself).  You can write it on any piece of paper.  please call us sooner if your blood sugar goes below 70, or if you have a lot of readings over 200. Please increase the  toujeo to 40 units each morning.   Please continue the same janumet.  Please call us next week, to tell us how the blood sugar is doing.   please call [phone], to get an appointment with a new primary provider. Please come back for a follow-up appointment in 1 month.

## 2016-06-23 ENCOUNTER — Other Ambulatory Visit: Payer: Self-pay

## 2016-06-23 MED ORDER — FREESTYLE LANCETS MISC: 2 refills | Status: DC

## 2016-06-23 MED ORDER — GLUCOSE BLOOD VI STRP: ORAL_STRIP | 2 refills | Status: DC

## 2016-06-23 MED ORDER — FREESTYLE FREEDOM LITE W/DEVICE KIT: PACK | 2 refills | Status: DC

## 2016-06-23 MED FILL — FREESTYLE LANCETS: 90 days supply | Qty: 200 | Fill #0

## 2016-06-23 MED FILL — FREESTYLE LITE TEST STRIP: 90 days supply | Qty: 200 | Fill #0

## 2016-06-23 MED FILL — FREESTYLE LITE METER: 30 days supply | Qty: 1 | Fill #0

## 2016-06-29 ENCOUNTER — Telehealth: Payer: Self-pay | Admitting: Endocrinology

## 2016-06-29 NOTE — Telephone Encounter (Signed)
I contacted the patient and advised of message via voicemail. Requested a call back if the patient would like to discuss.  

## 2016-06-29 NOTE — Telephone Encounter (Signed)
Please increase the toujeo to 50 units each morning.

## 2016-06-29 NOTE — Telephone Encounter (Signed)
See message and please advise, Thanks!  

## 2016-06-29 NOTE — Telephone Encounter (Signed)
BS levels have still been high every morning they are ranging in the 250 and in the PM after work they are ranging in the 180-190

## 2016-07-06 ENCOUNTER — Telehealth: Payer: Self-pay | Admitting: Endocrinology

## 2016-07-06 MED FILL — JANUMET XR 50-1,000 MG TAB: 50-1000 | 30 days supply | Qty: 60 | Fill #0

## 2016-07-06 NOTE — Telephone Encounter (Signed)
Pt is returning Megan's phone call, requests call back.

## 2016-07-06 NOTE — Telephone Encounter (Signed)
I contacted the patient and advised him to please continue the same insulins at this time.

## 2016-07-06 NOTE — Telephone Encounter (Signed)
See message and please advise, Thanks!  

## 2016-07-06 NOTE — Telephone Encounter (Signed)
Requested a call back if the patient would like to discuss further.

## 2016-07-06 NOTE — Telephone Encounter (Signed)
Patient called to advise on numbers which were 200 and 190. The lowest number has been 33. Please call patient to advise at [phone].

## 2016-07-06 NOTE — Telephone Encounter (Signed)
Please continue the same medications. I'll see you next time.   

## 2016-07-06 NOTE — Telephone Encounter (Signed)
Pateint contacted and advised to continue the same medications at this time.

## 2016-07-14 DIAGNOSIS — E113293 Type 2 diabetes mellitus with mild nonproliferative diabetic retinopathy without macular edema, bilateral: Secondary | ICD-10-CM | POA: Diagnosis not present

## 2016-07-14 DIAGNOSIS — H43813 Vitreous degeneration, bilateral: Secondary | ICD-10-CM | POA: Diagnosis not present

## 2016-07-22 MED FILL — UNIFINE PENTIPS 8MM 31G: 31G X 8 MM | 90 days supply | Qty: 100 | Fill #0

## 2016-07-22 MED FILL — TOUJEO SOLOSTAR 300 UNITS/M: 300 | 34 days supply | Qty: 5 | Fill #0

## 2016-07-23 ENCOUNTER — Encounter: Payer: Self-pay | Admitting: Endocrinology

## 2016-07-23 ENCOUNTER — Ambulatory Visit (INDEPENDENT_AMBULATORY_CARE_PROVIDER_SITE_OTHER): Payer: 59 | Admitting: Endocrinology

## 2016-07-23 VITALS — BP 168/88 | HR 79 | Ht 73.0 in | Wt 270.0 lb

## 2016-07-23 DIAGNOSIS — E119 Type 2 diabetes mellitus without complications: Secondary | ICD-10-CM | POA: Diagnosis not present

## 2016-07-23 DIAGNOSIS — Z794 Long term (current) use of insulin: Secondary | ICD-10-CM | POA: Diagnosis not present

## 2016-07-23 MED ORDER — INSULIN DETEMIR 100 UNIT/ML FLEXPEN: 70.0000 [IU] | PEN_INJECTOR | SUBCUTANEOUS | 11 refills | Status: DC

## 2016-07-23 NOTE — Patient Instructions (Addendum)
check your blood sugar twice a day.  vary the time of day when you check, between before the 3 meals, and at bedtime.  also check if you have symptoms of your blood sugar being too high or too low.  please keep a record of the readings and bring it to your next appointment here (or you can bring the meter itself).  You can write it on any piece of paper.  please call us sooner if your blood sugar goes below 70, or if you have a lot of readings over 200. Please change the toujeo to levemir, 70 units each morning.  I have sent a prescription to your pharmacy Please continue the same janumet.  This is a guess as to how much insulin you need, so please call us next week, to tell us how the blood sugar is doing.   Please come back for a follow-up appointment in 2 months.

## 2016-07-23 NOTE — Progress Notes (Signed)
Subjective:    Patient ID: James Garcia, male    DOB: 04/23/1955, 61 y.o.   MRN: [id number]  HPI Pt returns for f/u of diabetes mellitus: DM type: Insulin-requiring type 2 Dx'ed: 7628 Complications: polyneuropathy Therapy: insulin since 2016 DKA: never Severe hypoglycemia: never Pancreatitis: never Other: he declines multiple daily injections; he works 2nd shift.  Interval history: no cbg record, but states cbg's vary from 90-200's.  pt states he feels well in general.  It is in general higher as the day goes on.  He takes 50 units qam.   Past Medical History:  Diagnosis Date  . Diabetes mellitus without complication ([place])   . DM type 2 (diabetes mellitus, type 2) ([place])   . 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 arm surgery      Social History   Social History  . Marital status: Single    Spouse name: N/A  . Number of children: N/A  . Years of education: N/A   Occupational History  . Floor Tech at [employer]     Social History Main Topics  . Smoking status: Former Smoker    Packs/day: 0.25    Years: 5.00    Types: Cigarettes    Quit date: 06/04/1978  . Smokeless tobacco: Never Used  . Alcohol use No  . Drug use: No  . Sexual activity: Not on file   Other Topics Concern  . Not on file   Social History Narrative   ** Merged History Encounter **        Current Outpatient Prescriptions on File Prior to Visit  Medication Sig Dispense Refill  . Blood Glucose Monitoring Suppl (FREESTYLE FREEDOM LITE) w/Device KIT Use to check blood sugar 2 times per day. Dx Code E11.9 1 each 2  . glucose blood (FREESTYLE LITE) test strip Use to check blood sugar 2 times per day. Dx Code E11.9 200 each 2  . Lancets (FREESTYLE) lancets Use to check blood sugar 2 times per day. Dx Code E11.9 200 each 2  . SitaGLIPtin-MetFORMIN HCl (JANUMET XR) 50-1000 MG TB24 Take 2 tablets by mouth every morning. 60 tablet 11  . UNIFINE PENTIPS  31G X 8 MM MISC Use to inject insulin 1 time per day 30 each 0   No current facility-administered medications on file prior to visit.    No Known Allergies  Family History  Problem Relation Age of Onset  . Diabetes Father    BP (!) 168/88 (BP Location: Left Arm, Patient Position: Sitting, Cuff Size: Normal)   Pulse 79   Ht '6\' 1"'  (1.854 m)   Wt 270 lb (122.5 kg)   SpO2 96%   BMI 35.62 kg/m   Review of Systems He denies hypoglycemia.    Objective:   Physical Exam VITAL SIGNS:  See vs page GENERAL: no distress Pulses: dorsalis pedis intact bilat.   MSK: no deformity of the feet CV: 1+ bilat leg edema Skin:  no ulcer on the feet, but the skin is dry.  normal color and temp on the feet. Neuro: sensation is intact to touch on the feet, but decreased from normal. Ext: There is bilateral onychomycosis of the toenails.   Lab Results  Component Value Date   HGBA1C 9.6 06/22/2016      Assessment & Plan:  Insulin-requiring type 2 DM, with polyneuropathy: The pattern of his cbg's indicates he needs a faster-acting QD insulin.   Patient  Instructions  check your blood sugar twice a day.  vary the time of day when you check, between before the 3 meals, and at bedtime.  also check if you have symptoms of your blood sugar being too high or too low.  please keep a record of the readings and bring it to your next appointment here (or you can bring the meter itself).  You can write it on any piece of paper.  please call us sooner if your blood sugar goes below 70, or if you have a lot of readings over 200. Please change the toujeo to levemir, 70 units each morning.  I have sent a prescription to your pharmacy Please continue the same janumet.  This is a guess as to how much insulin you need, so please call us next week, to tell us how the blood sugar is doing.   Please come back for a follow-up appointment in 2 months.

## 2016-08-03 MED FILL — JANUMET XR 50-1,000 MG TAB: 50-1000 | 30 days supply | Qty: 60 | Fill #1

## 2016-08-05 DIAGNOSIS — E785 Hyperlipidemia, unspecified: Secondary | ICD-10-CM | POA: Diagnosis not present

## 2016-08-05 DIAGNOSIS — I2510 Atherosclerotic heart disease of native coronary artery without angina pectoris: Secondary | ICD-10-CM | POA: Diagnosis not present

## 2016-08-05 DIAGNOSIS — I10 Essential (primary) hypertension: Secondary | ICD-10-CM | POA: Diagnosis not present

## 2016-08-05 DIAGNOSIS — E119 Type 2 diabetes mellitus without complications: Secondary | ICD-10-CM | POA: Diagnosis not present

## 2016-08-05 MED FILL — SIMVASTATIN 40 MG TABLET: 40 | 30 days supply | Qty: 30 | Fill #0

## 2016-08-05 MED FILL — LOSARTAN POTASSIUM 100 MG T: 100 | 30 days supply | Qty: 30 | Fill #0

## 2016-08-12 MED FILL — LEVEMIR FLEXTOUCH 100 UNITS: 100 | 30 days supply | Qty: 21 | Fill #0

## 2016-08-25 MED FILL — OMEPRAZOLE DR 20 MG CAPSULE: 20 | 30 days supply | Qty: 30 | Fill #0

## 2016-08-27 MED FILL — JANUMET XR 50-1,000 MG TAB: 50-1000 | 30 days supply | Qty: 60 | Fill #2

## 2016-09-01 MED FILL — LOSARTAN POTASSIUM 100 MG T: 100 | 30 days supply | Qty: 30 | Fill #1

## 2016-09-01 MED FILL — SIMVASTATIN 40 MG TABLET: 40 | 30 days supply | Qty: 30 | Fill #1

## 2016-09-10 MED FILL — LEVEMIR FLEXTOUCH 100 UNITS: 100 | 30 days supply | Qty: 21 | Fill #1

## 2016-09-22 ENCOUNTER — Encounter: Payer: Self-pay | Admitting: Endocrinology

## 2016-09-22 ENCOUNTER — Ambulatory Visit (INDEPENDENT_AMBULATORY_CARE_PROVIDER_SITE_OTHER): Payer: 59 | Admitting: Endocrinology

## 2016-09-22 VITALS — BP 152/84 | HR 78 | Ht 73.0 in | Wt 268.0 lb

## 2016-09-22 DIAGNOSIS — Z794 Long term (current) use of insulin: Secondary | ICD-10-CM | POA: Diagnosis not present

## 2016-09-22 DIAGNOSIS — E119 Type 2 diabetes mellitus without complications: Secondary | ICD-10-CM

## 2016-09-22 LAB — POCT GLYCOSYLATED HEMOGLOBIN (HGB A1C): Hemoglobin A1C: 7.6

## 2016-09-22 NOTE — Progress Notes (Signed)
Subjective:    Patient ID: James Garcia, male    DOB: Jun 24, 1955, 61 y.o.   MRN: [id number]  HPI Pt returns for f/u of diabetes mellitus: DM type: Insulin-requiring type 2 Dx'ed: 7782 Complications: polyneuropathy Therapy: insulin since 2016, and janumet.  DKA: never Severe hypoglycemia: never Pancreatitis: never Other: he declines multiple daily injections; he works 2nd shift. He changed QD insulin to levemir, due to pattern of cbg's.   Interval history: Meter is downloaded today, and the printout is scanned into the record.  It varies from 90-240.  It is in general higher as the day goes on, but not necessarily so.  Past Medical History:  Diagnosis Date  . Diabetes mellitus without complication ([place])   . DM type 2 (diabetes mellitus, type 2) ([place])   . 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 arm surgery      Social History   Social History  . Marital status: Single    Spouse name: N/A  . Number of children: N/A  . Years of education: N/A   Occupational History  . 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 Smoking status: Former Smoker    Packs/day: 0.25    Years: 5.00    Types: Cigarettes    Quit date: 06/04/1978  . Smokeless tobacco: Never Used  . Alcohol use No  . Drug use: No  . Sexual activity: Not on file   Other Topics Concern  . Not on file   Social History Narrative   ** Merged History Encounter **        Current Outpatient Prescriptions on File Prior to Visit  Medication Sig Dispense Refill  . Blood Glucose Monitoring Suppl (FREESTYLE FREEDOM LITE) w/Device KIT Use to check blood sugar 2 times per day. Dx Code E11.9 1 each 2  . glucose blood (FREESTYLE LITE) test strip Use to check blood sugar 2 times per day. Dx Code E11.9 200 each 2  . Insulin Detemir (LEVEMIR FLEXTOUCH) 100 UNIT/ML Pen Inject 70 Units into the skin every morning. And pen needles 2/day 30 mL 11  . Lancets  (FREESTYLE) lancets Use to check blood sugar 2 times per day. Dx Code E11.9 200 each 2  . SitaGLIPtin-MetFORMIN HCl (JANUMET XR) 50-1000 MG TB24 Take 2 tablets by mouth every morning. 60 tablet 11  . UNIFINE PENTIPS 31G X 8 MM MISC Use to inject insulin 1 time per day 30 each 0   No current facility-administered medications on file prior to visit.     No Known Allergies  Family History  Problem Relation Age of Onset  . Diabetes Father     BP (!) 152/84   Pulse 78   Ht _0  (1.854 m)   Wt 268 lb (121.6 kg)   SpO2 95%   BMI 35.36 kg/m    Review of Systems He denies hypoglycemia    Objective:   Physical Exam VITAL SIGNS:  See vs page GENERAL: no distress Pulses: dorsalis pedis intact bilat.   MSK: no deformity of the feet CV: trace bilat leg edema.   Skin:  no ulcer on the feet, but the skin is dry.  normal color and temp on the feet. Neuro: sensation is intact to touch on the feet, but decreased from normal. Ext: There is bilateral onychomycosis of the toenails.    A1c=7.6%    Assessment & Plan:  Insulin-requiring  type 2 DM, with polyneuropathy: this is the best control this pt should aim for, given this regimen, which does match insulin to his changing needs throughout the day  Patient Instructions  check your blood sugar twice a day.  vary the time of day when you check, between before the 3 meals, and at bedtime.  also check if you have symptoms of your blood sugar being too high or too low.  please keep a record of the readings and bring it to your next appointment here (or you can bring the meter itself).  You can write it on any piece of paper.  please call us sooner if your blood sugar goes below 70, or if you have a lot of readings over 200. Please continue the same medications. Please come back for a follow-up appointment in 3 months.

## 2016-09-22 NOTE — Patient Instructions (Addendum)
check your blood sugar twice a day.  vary the time of day when you check, between before the 3 meals, and at bedtime.  also check if you have symptoms of your blood sugar being too high or too low.  please keep a record of the readings and bring it to your next appointment here (or you can bring the meter itself).  You can write it on any piece of paper.  please call us sooner if your blood sugar goes below 70, or if you have a lot of readings over 200. Please continue the same medications. Please come back for a follow-up appointment in 3 months.      

## 2016-10-04 MED FILL — LOSARTAN POTASSIUM 100 MG T: 100 | 30 days supply | Qty: 30 | Fill #2

## 2016-10-04 MED FILL — JANUMET XR 50-1,000 MG TAB: 50-1000 | 30 days supply | Qty: 60 | Fill #3

## 2016-10-15 MED FILL — LEVEMIR FLEXTOUCH 100 UNITS: 100 | 30 days supply | Qty: 21 | Fill #2

## 2016-11-01 MED FILL — LOSARTAN POTASSIUM 100 MG T: 100 | 30 days supply | Qty: 30 | Fill #3

## 2016-11-01 MED FILL — JANUMET XR 50-1,000 MG TAB: 50-1000 | 30 days supply | Qty: 60 | Fill #4

## 2016-11-16 MED FILL — FREESTYLE LANCETS: 90 days supply | Qty: 200 | Fill #1

## 2016-11-16 MED FILL — FREESTYLE LITE TEST STRIP: 90 days supply | Qty: 200 | Fill #1

## 2016-11-16 MED FILL — UNIFINE PENTIPS 8MM 31G: 31G X 8 MM | 50 days supply | Qty: 100 | Fill #0

## 2016-11-16 MED FILL — LEVEMIR FLEXTOUCH 100 UNITS: 100 | 30 days supply | Qty: 21 | Fill #3

## 2016-11-16 MED FILL — OMEPRAZOLE 20 MG CAP: 20 | 30 days supply | Qty: 30 | Fill #1

## 2016-12-01 MED FILL — LOSARTAN POTASSIUM 100 MG T: 100 | 90 days supply | Qty: 90 | Fill #0

## 2016-12-01 MED FILL — JANUMET XR 50-1,000 MG TAB: 50-1000 | 30 days supply | Qty: 60 | Fill #5

## 2016-12-13 MED FILL — LEVEMIR FLEXTOUCH 100 UNITS: 100 | 30 days supply | Qty: 21 | Fill #4

## 2016-12-24 ENCOUNTER — Encounter: Payer: Self-pay | Admitting: Endocrinology

## 2016-12-24 ENCOUNTER — Ambulatory Visit (INDEPENDENT_AMBULATORY_CARE_PROVIDER_SITE_OTHER): Payer: 59 | Admitting: Endocrinology

## 2016-12-24 VITALS — BP 152/82 | HR 78 | Wt 268.6 lb

## 2016-12-24 DIAGNOSIS — Z794 Long term (current) use of insulin: Secondary | ICD-10-CM | POA: Diagnosis not present

## 2016-12-24 DIAGNOSIS — Z125 Encounter for screening for malignant neoplasm of prostate: Secondary | ICD-10-CM | POA: Insufficient documentation

## 2016-12-24 DIAGNOSIS — Z0000 Encounter for general adult medical examination without abnormal findings: Secondary | ICD-10-CM

## 2016-12-24 DIAGNOSIS — E119 Type 2 diabetes mellitus without complications: Secondary | ICD-10-CM | POA: Diagnosis not present

## 2016-12-24 LAB — HEPATIC FUNCTION PANEL
ALT: 20 U/L (ref 0–53)
AST: 13 U/L (ref 0–37)
Albumin: 4.1 g/dL (ref 3.5–5.2)
Alkaline Phosphatase: 71 U/L (ref 39–117)
BILIRUBIN TOTAL: 0.7 mg/dL (ref 0.2–1.2)
Bilirubin, Direct: 0.1 mg/dL (ref 0.0–0.3)
Total Protein: 6.6 g/dL (ref 6.0–8.3)

## 2016-12-24 LAB — CBC WITH DIFFERENTIAL/PLATELET
BASOS ABS: 0 10*3/uL (ref 0.0–0.1)
Basophils Relative: 0.6 % (ref 0.0–3.0)
Eosinophils Absolute: 0.3 10*3/uL (ref 0.0–0.7)
Eosinophils Relative: 4.6 % (ref 0.0–5.0)
HCT: 47.6 % (ref 39.0–52.0)
Hemoglobin: 15.9 g/dL (ref 13.0–17.0)
LYMPHS ABS: 1.9 10*3/uL (ref 0.7–4.0)
Lymphocytes Relative: 27.4 % (ref 12.0–46.0)
MCHC: 33.5 g/dL (ref 30.0–36.0)
MCV: 90 fl (ref 78.0–100.0)
MONOS PCT: 7.6 % (ref 3.0–12.0)
Monocytes Absolute: 0.5 10*3/uL (ref 0.1–1.0)
NEUTROS ABS: 4.1 10*3/uL (ref 1.4–7.7)
NEUTROS PCT: 59.8 % (ref 43.0–77.0)
PLATELETS: 135 10*3/uL — AB (ref 150.0–400.0)
RBC: 5.29 Mil/uL (ref 4.22–5.81)
RDW: 13.7 % (ref 11.5–15.5)
WBC: 6.9 10*3/uL (ref 4.0–10.5)

## 2016-12-24 LAB — BASIC METABOLIC PANEL
BUN: 18 mg/dL (ref 6–23)
CHLORIDE: 102 meq/L (ref 96–112)
CO2: 31 meq/L (ref 19–32)
CREATININE: 0.83 mg/dL (ref 0.40–1.50)
Calcium: 8.5 mg/dL (ref 8.4–10.5)
GFR: 99.86 mL/min (ref >60.00)
Glucose, Bld: 205 mg/dL — ABNORMAL HIGH (ref 70–99)
Potassium: 4.4 mEq/L (ref 3.5–5.1)
Sodium: 139 mEq/L (ref 135–145)

## 2016-12-24 LAB — LIPID PANEL
CHOL/HDL RATIO: 5
CHOLESTEROL: 189 mg/dL (ref 0–200)
HDL: 40 mg/dL (ref >39.00)
LDL CALC: 127 mg/dL — AB (ref 0–99)
NONHDL: 148.94
Triglycerides: 108 mg/dL (ref 0.0–149.0)
VLDL: 21.6 mg/dL (ref 0.0–40.0)

## 2016-12-24 LAB — POCT GLYCOSYLATED HEMOGLOBIN (HGB A1C): HEMOGLOBIN A1C: 7.4

## 2016-12-24 LAB — URINALYSIS, ROUTINE W REFLEX MICROSCOPIC
Bilirubin Urine: NEGATIVE
Hgb urine dipstick: NEGATIVE
KETONES UR: NEGATIVE
Leukocytes, UA: NEGATIVE
Nitrite: NEGATIVE
PH: 6 (ref 5.0–8.0)
RBC / HPF: NONE SEEN (ref 0–?)
Specific Gravity, Urine: 1.025 (ref 1.000–1.030)
TOTAL PROTEIN, URINE-UPE24: NEGATIVE
UROBILINOGEN UA: 0.2 (ref 0.0–1.0)
WBC, UA: NONE SEEN (ref 0–?)

## 2016-12-24 LAB — PSA: PSA: 0.35 ng/mL (ref 0.10–4.00)

## 2016-12-24 LAB — MICROALBUMIN / CREATININE URINE RATIO
Creatinine,U: 102 mg/dL
MICROALB/CREAT RATIO: 0.7 mg/g (ref 0.0–30.0)
Microalb, Ur: <0.7 mg/dL (ref 0.0–1.9)

## 2016-12-24 LAB — TSH: TSH: 1.16 u[IU]/mL (ref 0.35–4.50)

## 2016-12-24 NOTE — Patient Instructions (Addendum)
check your blood sugar twice a day.  vary the time of day when you check, between before the 3 meals, and at bedtime.  also check if you have symptoms of your blood sugar being too high or too low.  please keep a record of the readings and bring it to your next appointment here (or you can bring the meter itself).  You can write it on any piece of paper.  please call us sooner if your blood sugar goes below 70, or if you have a lot of readings over 200. Please continue the same medications. please call [phone] [facility] [place] office), to get an appointment with a new primary doctor. blood tests are requested for you today.  We'll let you know about the results. Please come back for a follow-up appointment in 4 months.

## 2016-12-24 NOTE — Progress Notes (Signed)
Subjective:    Patient ID: SEHAJ MCENROE, male    DOB: [birth year], 61 y.o.   MRN: [id number]  HPI Pt returns for f/u of diabetes mellitus: DM type: Insulin-requiring type 2 Dx'ed: 7672 Complications: polyneuropathy and DR Therapy: insulin since 2016, and janumet.  DKA: never Severe hypoglycemia: never Pancreatitis: never Other: he declines multiple daily injections; he works 2nd shift. He changed QD insulin to levemir, due to pattern of cbg's.   Interval history: Meter is downloaded today, and the printout is scanned into the record.  It varies from 72-250.  It is in general higher as the day goes on, but not necessarily so.  pt states he feels well in general. Past Medical History:  Diagnosis Date  . Diabetes mellitus without complication ([place])   . DM type 2 (diabetes mellitus, type 2) ([place])   . 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 arm surgery      Social History   Social History  . Marital status: Single    Spouse name: N/A  . Number of children: N/A  . Years of education: N/A   Occupational History  . 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 Smoking status: Former Smoker    Packs/day: 0.25    Years: 5.00    Types: Cigarettes    Quit date: 06/04/1978  . Smokeless tobacco: Never Used  . Alcohol use No  . Drug use: No  . Sexual activity: Not on file   Other Topics Concern  . Not on file   Social History Narrative   ** Merged History Encounter **        Current Outpatient Prescriptions on File Prior to Visit  Medication Sig Dispense Refill  . Blood Glucose Monitoring Suppl (FREESTYLE FREEDOM LITE) w/Device KIT Use to check blood sugar 2 times per day. Dx Code E11.9 1 each 2  . glucose blood (FREESTYLE LITE) test strip Use to check blood sugar 2 times per day. Dx Code E11.9 200 each 2  . Insulin Detemir (LEVEMIR FLEXTOUCH) 100 UNIT/ML Pen Inject 70 Units into the skin every morning. And pen  needles 2/day 30 mL 11  . Lancets (FREESTYLE) lancets Use to check blood sugar 2 times per day. Dx Code E11.9 200 each 2  . SitaGLIPtin-MetFORMIN HCl (JANUMET XR) 50-1000 MG TB24 Take 2 tablets by mouth every morning. 60 tablet 11  . UNIFINE PENTIPS 31G X 8 MM MISC Use to inject insulin 1 time per day 30 each 0   No current facility-administered medications on file prior to visit.     No Known Allergies  Family History  Problem Relation Age of Onset  . Diabetes Father     BP (!) 152/82   Pulse 78   Wt 268 lb 9.6 oz (121.8 kg)   SpO2 98%   BMI 35.44 kg/m    Review of Systems He denies hypoglycemia.      Objective:   Physical Exam VITAL SIGNS:  See vs page GENERAL: no distress Pulses: dorsalis pedis intact bilat.   MSK: no deformity of the feet CV: trace bilat leg edema.   Skin:  no ulcer on the feet, but the skin is dry.  normal color and temp on the feet.  Old healed surgical scars on the left foot, dorsal aspect.  Neuro: sensation is intact to touch on the feet, but decreased from normal. Ext: There  is bilateral onychomycosis of the toenails.   Lab Results  Component Value Date   HGBA1C 7.4 12/24/2016   Lab Results  Component Value Date   CHOL 189 12/24/2016   HDL 40.00 12/24/2016   LDLCALC 127 (H) 12/24/2016   TRIG 108.0 12/24/2016   CHOLHDL 5 12/24/2016      Assessment & Plan:  Insulin-requiring type 2 DM, with DR: well-controlled.   HTN: pt will need recheck at new PCP.   Thrombocytopenia: due for recheck.     Patient Instructions  check your blood sugar twice a day.  vary the time of day when you check, between before the 3 meals, and at bedtime.  also check if you have symptoms of your blood sugar being too high or too low.  please keep a record of the readings and bring it to your next appointment here (or you can bring the meter itself).  You can write it on any piece of paper.  please call us sooner if your blood sugar goes below 70, or if you have a  lot of readings over 200. Please continue the same medications. please call [phone] [facility] [place] office), to get an appointment with a new primary doctor. blood tests are requested for you today.  We'll let you know about the results. Please come back for a follow-up appointment in 4 months.

## 2016-12-29 MED FILL — JANUMET XR 50-1,000 MG TAB: 50-1000 | 30 days supply | Qty: 60 | Fill #6

## 2017-01-07 MED FILL — LEVEMIR FLEXTOUCH 100 UNITS: 100 | 30 days supply | Qty: 21 | Fill #5

## 2017-01-28 MED FILL — OMEPRAZOLE 20 MG CAP: 20 | 30 days supply | Qty: 30 | Fill #2

## 2017-01-28 MED FILL — JANUMET XR 50-1,000 MG TAB: 50-1000 | 30 days supply | Qty: 60 | Fill #7

## 2017-02-07 MED FILL — LEVEMIR FLEXTOUCH 100 UNITS: 100 | 30 days supply | Qty: 21 | Fill #6

## 2017-03-07 MED FILL — LEVEMIR FLEXTOUCH 100 UNITS: 100 | 30 days supply | Qty: 21 | Fill #7

## 2017-03-09 MED FILL — LOSARTAN POTASSIUM 100 MG T: 100 | 30 days supply | Qty: 30 | Fill #1

## 2017-03-09 MED FILL — JANUMET XR 50-1,000 MG TAB: 50-1000 | 30 days supply | Qty: 60 | Fill #8

## 2017-03-28 MED FILL — UNIFINE PENTIPS 8MM 31G: 31G X 8 MM | 50 days supply | Qty: 100 | Fill #1

## 2017-03-31 MED FILL — LEVEMIR FLEXTOUCH 100 UNITS: 100 | 30 days supply | Qty: 21 | Fill #8

## 2017-04-07 MED FILL — JANUMET XR 50-1,000 MG TAB: 50-1000 | 30 days supply | Qty: 60 | Fill #9

## 2017-04-08 MED FILL — LOSARTAN POTASSIUM 100 MG T: 100 | 90 days supply | Qty: 90 | Fill #0

## 2017-04-11 MED FILL — OMEPRAZOLE 20 MG CAP: 20 | 30 days supply | Qty: 30 | Fill #3

## 2017-04-26 ENCOUNTER — Ambulatory Visit (INDEPENDENT_AMBULATORY_CARE_PROVIDER_SITE_OTHER): Payer: 59 | Admitting: Endocrinology

## 2017-04-26 ENCOUNTER — Telehealth: Payer: Self-pay | Admitting: Endocrinology

## 2017-04-26 ENCOUNTER — Encounter: Payer: Self-pay | Admitting: Endocrinology

## 2017-04-26 VITALS — BP 168/92 | HR 71 | Wt 273.0 lb

## 2017-04-26 DIAGNOSIS — Z794 Long term (current) use of insulin: Secondary | ICD-10-CM

## 2017-04-26 DIAGNOSIS — E119 Type 2 diabetes mellitus without complications: Secondary | ICD-10-CM | POA: Diagnosis not present

## 2017-04-26 LAB — POCT GLYCOSYLATED HEMOGLOBIN (HGB A1C): HEMOGLOBIN A1C: 8

## 2017-04-26 MED ORDER — INSULIN DETEMIR 100 UNIT/ML FLEXPEN: 80.0000 [IU] | PEN_INJECTOR | SUBCUTANEOUS | 11 refills | Status: DC

## 2017-04-26 MED FILL — LEVEMIR FLEXTOUCH 100 UNITS: 100 | 30 days supply | Qty: 24 | Fill #0

## 2017-04-26 NOTE — Patient Instructions (Addendum)
Your blood pressure is high today.  Please see your primary care provider soon, to have it rechecked check your blood sugar twice a day.  vary the time of day when you check, between before the 3 meals, and at bedtime.  also check if you have symptoms of your blood sugar being too high or too low.  please keep a record of the readings and bring it to your next appointment here (or you can bring the meter itself).  You can write it on any piece of paper.  please call us sooner if your blood sugar goes below 70, or if you have a lot of readings over 200. Please continue the same janumet, and:  Increase the levemir to 80 units each morning.   Please come back for a follow-up appointment in 3 months.

## 2017-04-26 NOTE — Progress Notes (Signed)
Subjective:    Patient ID: James Garcia, male    DOB: 05/12/1955, 62 y.o.   MRN: [id number]  HPI Pt returns for f/u of diabetes mellitus: DM type: Insulin-requiring type 2 Dx'ed: 9476 Complications: polyneuropathy and DR Therapy: insulin since 2016, and janumet.  DKA: never Severe hypoglycemia: never Pancreatitis: never Other: he declines multiple daily injections; he works 2nd shift. He changed QD insulin to levemir, due to pattern of cbg's.   Interval history: no cbg record, but states it varies from 130-200's.  It is in general higher as the day goes on, but not necessarily so.  pt states he feels well in general.   Past Medical History:  Diagnosis Date  . Diabetes mellitus without complication ([place])   . DM type 2 (diabetes mellitus, type 2) ([place])   . 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 arm surgery      Social History   Socioeconomic History  . Marital status: Single    Spouse name: Not on file  . Number of children: Not on file  . Years of education: Not on file  . Highest education level: Not on file  Social Needs  . Financial resource strain: Not on file  . Food insecurity - worry: Not on file  . Food insecurity - inability: Not on file  . Transportation needs - medical: Not on file  . Transportation needs - non-medical: Not on file  Occupational History  . Occupation: Engineer, maintenance (IT) at [employer]  . Smoking status: Former Smoker    Packs/day: 0.25    Years: 5.00    Pack years: 1.25    Types: Cigarettes    Last attempt to quit: 06/04/1978    Years since quitting: 38.9  . Smokeless tobacco: Never Used  Substance and Sexual Activity  . Alcohol use: No  . Drug use: No  . Sexual activity: Not on file  Other Topics Concern  . Not on file  Social History Narrative   ** Merged History Encounter **        Current Outpatient Medications on File Prior to Visit  Medication Sig Dispense Refill  .  Blood Glucose Monitoring Suppl (FREESTYLE FREEDOM LITE) w/Device KIT Use to check blood sugar 2 times per day. Dx Code E11.9 1 each 2  . glucose blood (FREESTYLE LITE) test strip Use to check blood sugar 2 times per day. Dx Code E11.9 200 each 2  . Lancets (FREESTYLE) lancets Use to check blood sugar 2 times per day. Dx Code E11.9 200 each 2  . SitaGLIPtin-MetFORMIN HCl (JANUMET XR) 50-1000 MG TB24 Take 2 tablets by mouth every morning. 60 tablet 11  . UNIFINE PENTIPS 31G X 8 MM MISC Use to inject insulin 1 time per day 30 each 0   No current facility-administered medications on file prior to visit.     No Known Allergies  Family History  Problem Relation Age of Onset  . Diabetes Father     BP (!) 168/92 (BP Location: Left Arm, Patient Position: Sitting, Cuff Size: Normal)   Pulse 71   Wt 273 lb (123.8 kg)   SpO2 97%   BMI 36.02 kg/m    Review of Systems He denies hypoglycemia.     Objective:   Physical Exam VITAL SIGNS:  See vs page.  GENERAL: no distress.  Pulses: dorsalis pedis intact bilat.   MSK: no deformity of the feet  CV: trace bilat leg edema.   Skin:  no ulcer on the feet, but the skin is dry.  normal color and temp on the feet.  Old healed surgical scars on the left foot, dorsal aspect.  Neuro: sensation is intact to touch on the feet, but decreased from normal. Ext: There is bilateral onychomycosis of the toenails.   Lab Results  Component Value Date   CREATININE 0.83 12/24/2016   BUN 18 12/24/2016   NA 139 12/24/2016   K 4.4 12/24/2016   CL 102 12/24/2016   CO2 31 12/24/2016   Lab Results  Component Value Date   HGBA1C 8.0 04/26/2017      Assessment & Plan:  Insulin-requiring type 2 DM, with DR: worse  Patient Instructions  Your blood pressure is high today.  Please see your primary care provider soon, to have it rechecked check your blood sugar twice a day.  vary the time of day when you check, between before the 3 meals, and at bedtime.  also  check if you have symptoms of your blood sugar being too high or too low.  please keep a record of the readings and bring it to your next appointment here (or you can bring the meter itself).  You can write it on any piece of paper.  please call us sooner if your blood sugar goes below 70, or if you have a lot of readings over 200. Please continue the same janumet, and:  Increase the levemir to 80 units each morning.   Please come back for a follow-up appointment in 3 months.

## 2017-04-26 NOTE — Telephone Encounter (Signed)
This is outside the scope of my practice, so please ask PCP.

## 2017-04-26 NOTE — Telephone Encounter (Signed)
Pt stated that he is needing medication for pain in his feet, he states he has a burning sensation in both feet.   Please advise

## 2017-04-27 NOTE — Telephone Encounter (Signed)
I called and spoke with patient's wife letting her know that needed to be referred to a PCP when patient established one.

## 2017-04-27 NOTE — Telephone Encounter (Signed)
Pt is advised to get a new PCP.

## 2017-04-27 NOTE — Telephone Encounter (Signed)
Patient's PCP retired & patient is currently in the process of finding a new one.

## 2017-05-06 MED FILL — JANUMET XR 50-1,000 MG TAB: 50-1000 | 30 days supply | Qty: 60 | Fill #10

## 2017-05-24 ENCOUNTER — Telehealth: Payer: Self-pay | Admitting: Family Medicine

## 2017-05-24 ENCOUNTER — Other Ambulatory Visit: Payer: Self-pay

## 2017-05-24 ENCOUNTER — Encounter: Payer: Self-pay | Admitting: Family Medicine

## 2017-05-24 ENCOUNTER — Ambulatory Visit (INDEPENDENT_AMBULATORY_CARE_PROVIDER_SITE_OTHER): Payer: 59 | Admitting: Family Medicine

## 2017-05-24 VITALS — BP 138/78 | HR 78 | Temp 98.0°F | Resp 16 | Ht 70.87 in | Wt 270.0 lb

## 2017-05-24 DIAGNOSIS — I10 Essential (primary) hypertension: Secondary | ICD-10-CM

## 2017-05-24 MED FILL — LEVEMIR FLEXTOUCH 100 UNITS: 100 | 30 days supply | Qty: 24 | Fill #1

## 2017-05-24 NOTE — Telephone Encounter (Signed)
Patient had to reschedule due to wait. I called and left message to apologize for his wait today. Happy to see him next week as he rescheduled or sooner if needed.

## 2017-05-24 NOTE — Patient Instructions (Signed)
     IF you received an x-ray today, you will receive an invoice from [facility]. Please contact [facility] at [phone] with questions or concerns regarding your invoice.   IF you received labwork today, you will receive an invoice from [facility]. Please contact [facility] at [phone] with questions or concerns regarding your invoice.   Our billing staff will not be able to assist you with questions regarding bills from these companies.  You will be contacted with the lab results as soon as they are available. The fastest way to get your results is to activate your My Chart account. Instructions are located on the last page of this paperwork. If you have not heard from us regarding the results in 2 weeks, please contact this office.     

## 2017-05-26 NOTE — Progress Notes (Signed)
Left without being seen.

## 2017-05-31 ENCOUNTER — Encounter: Payer: Self-pay | Admitting: Family Medicine

## 2017-05-31 ENCOUNTER — Ambulatory Visit (INDEPENDENT_AMBULATORY_CARE_PROVIDER_SITE_OTHER): Payer: 59 | Admitting: Family Medicine

## 2017-05-31 ENCOUNTER — Other Ambulatory Visit: Payer: Self-pay

## 2017-05-31 VITALS — BP 142/72 | HR 62 | Temp 98.0°F | Resp 16 | Ht 70.47 in | Wt 269.0 lb

## 2017-05-31 DIAGNOSIS — I10 Essential (primary) hypertension: Secondary | ICD-10-CM

## 2017-05-31 DIAGNOSIS — G6289 Other specified polyneuropathies: Secondary | ICD-10-CM | POA: Diagnosis not present

## 2017-05-31 DIAGNOSIS — E785 Hyperlipidemia, unspecified: Secondary | ICD-10-CM

## 2017-05-31 DIAGNOSIS — Z1211 Encounter for screening for malignant neoplasm of colon: Secondary | ICD-10-CM

## 2017-05-31 DIAGNOSIS — E1140 Type 2 diabetes mellitus with diabetic neuropathy, unspecified: Secondary | ICD-10-CM | POA: Diagnosis not present

## 2017-05-31 DIAGNOSIS — Z794 Long term (current) use of insulin: Secondary | ICD-10-CM

## 2017-05-31 MED ORDER — ATORVASTATIN CALCIUM 10 MG PO TABS: 10.0000 mg | ORAL_TABLET | Freq: Every day | ORAL | 1 refills | Status: DC

## 2017-05-31 MED ORDER — AMLODIPINE BESYLATE 5 MG PO TABS: 5.0000 mg | ORAL_TABLET | Freq: Every day | ORAL | 1 refills | Status: DC

## 2017-05-31 MED ORDER — GABAPENTIN 300 MG PO CAPS: 300.0000 mg | ORAL_CAPSULE | Freq: Two times a day (BID) | ORAL | 2 refills | Status: DC

## 2017-05-31 MED FILL — ATORVASTATIN 10 MG TABLET: 10 | 90 days supply | Qty: 90 | Fill #0

## 2017-05-31 MED FILL — AMLODIPINE BESYLATE 5 MG TA: 5 | 90 days supply | Qty: 90 | Fill #0

## 2017-05-31 MED FILL — GABAPENTIN 300 MG CAPSULE: 300 | 30 days supply | Qty: 60 | Fill #0

## 2017-05-31 NOTE — Patient Instructions (Addendum)
For burning and tingling in feet and hands I suspect that is diabetic neuropathy, but will check other tests as well.   Try the gabapentin once at night for the first week, then twice per day if still having symptoms but no side effects. Recheck this issue in next 4-6 weeks.   For blood pressure, continue losartan once per day but add amlodipine once per day. Recheck levels in next 6 weeks. Return to the clinic or go to the nearest emergency room if any of your symptoms worsen or new symptoms occur.  I will check cholesterol today, but start Lipitor once per day for now. We may need to increase that dose but can start with low dose initially.   Thank you for coming in today.    IF you received an x-ray today, you will receive an invoice from [facility]. Please contact [facility] at [phone] with questions or concerns regarding your invoice.   IF you received labwork today, you will receive an invoice from [place]. Please contact [facility] at [phone] with questions or concerns regarding your invoice.   Our billing staff will not be able to assist you with questions regarding bills from these companies.  You will be contacted with the lab results as soon as they are available. The fastest way to get your results is to activate your My Chart account. Instructions are located on the last page of this paperwork. If you have not heard from [place] regarding the results in 2 weeks, please contact this office.

## 2017-05-31 NOTE — Progress Notes (Signed)
Subjective:  By signing my name below, I, Essence Howell, attest that this documentation has been prepared under the direction and in the presence of Wendie Agreste, MD Electronically Signed: Ladene Artist, ED Scribe 05/31/2017 at 10:15 AM.   Patient ID: James Garcia, male    DOB: 1955/10/17, 62 y.o.   MRN: [id number]  Chief Complaint  Patient presents with  . Establish Care    pt would like to est. with a PCP to manage chronic conditions   . Foot Pain    pt states he has been having pain and tingling in both feet for months.    HPI James Garcia is a 62 y.o. male who presents to Primary Care at [facility] to establish care. H/o HTN, DM on insulin and Janumet XR, thrombocytopenia. Pt is fasting at this visit.  DM Followed by Dr. Hale Bogus, last visit 3/12. Complicated by polyneuropathy and retinopathy. Has been on insulin since 2016. On qd insulin due to working second shift and difficulty with multiple daily injections. BP elevated at 168/92 at last OV. Levemir was increased to 80 units. Planned f/u 3 months with endocrine.  Pt reports daily, intermittent burning and tingling sensations in both feet and hands for the past few months. No meds tried in the past. Pt has had retinopathy for ~1 yr.  HTN Lab Results  Component Value Date   CREATININE 0.83 12/24/2016  Losartan 100 mg qd which he has been on for several yrs. Pt has been compliant with medication. Denies cp, sob, lightheadedness, dizziness.  Hyperlipidemia Lab Results  Component Value Date   CHOL 189 12/24/2016   HDL 40.00 12/24/2016   LDLCALC 127 (H) 12/24/2016   TRIG 108.0 12/24/2016   CHOLHDL 5 12/24/2016   Lab Results  Component Value Date   ALT 20 12/24/2016   AST 13 12/24/2016   ALKPHOS 71 12/24/2016   BILITOT 0.7 12/24/2016  Not currently on a statin.  Thrombocytopenia Borderline low at 135 in Oct. Lab Results  Component Value Date   WBC 6.9 12/24/2016   HGB 15.9 12/24/2016   HCT 47.6 12/24/2016   MCV 90.0 12/24/2016   PLT 135.0 (L) 12/24/2016   Health Maintenance Last colonoscopy was over 10 yrs ago.  Patient Active Problem List   Diagnosis Date Noted  . Screening for prostate cancer 12/24/2016  . Diabetes ([place]) 05/01/2015  . Thrombocytopenia ([place]) 06/04/2014  . TB (tuberculosis), treated 03/31/2013  . Cough 03/30/2013   Past Medical History:  Diagnosis Date  . Diabetes mellitus without complication ([place])   . DM type 2 (diabetes mellitus, type 2) ([place])   . Hypertension   . Tuberculin skin test (TST) positive    Past Surgical History:  Procedure Laterality Date  . arm surgery    . ELBOW SURGERY     No Known Allergies Prior to Admission medications   Medication Sig Start Date End Date Taking? Authorizing Provider  Blood Glucose Monitoring Suppl (FREESTYLE FREEDOM LITE) w/Device KIT Use to check blood sugar 2 times per day. Dx Code E11.9 06/23/16   Renato Shin, MD  glucose blood (FREESTYLE LITE) test strip Use to check blood sugar 2 times per day. Dx Code E11.9 06/23/16   Renato Shin, MD  Insulin Detemir (LEVEMIR FLEXTOUCH) 100 UNIT/ML Pen Inject 80 Units into the skin every morning. And pen needles 2/day 04/26/17   Renato Shin, MD  Lancets (FREESTYLE) lancets Use to check blood sugar 2 times per day. Dx Code E11.9 06/23/16  Renato Shin, MD  losartan (COZAAR) 100 MG tablet Take 100 mg by mouth daily. 04/08/17   [provider]  omeprazole (PRILOSEC) 20 MG capsule Take 20 mg by mouth every morning. 04/11/17   [provider]  SitaGLIPtin-MetFORMIN HCl (JANUMET XR) 50-1000 MG TB24 Take 2 tablets by mouth every morning. 06/22/16   Renato Shin, MD  UNIFINE PENTIPS 31G X 8 MM MISC Use to inject insulin 1 time per day 06/01/16   Renato Shin, MD   Social History   Socioeconomic History  . Marital status: Single    Spouse name: Not on file  . Number of children: Not on file  . Years of education: Not on file  . Highest education level: Not on file    Social Needs  . Financial resource strain: Not on file  . Food insecurity - worry: Not on file  . Food insecurity - inability: Not on file  . Transportation needs - medical: Not on file  . Transportation needs - non-medical: Not on file  Occupational History  . Occupation: Engineer, maintenance (IT) at [employer]  . Smoking status: Former Smoker    Packs/day: 0.25    Years: 5.00    Pack years: 1.25    Types: Cigarettes    Last attempt to quit: 06/04/1978    Years since quitting: 39.0  . Smokeless tobacco: Never Used  Substance and Sexual Activity  . Alcohol use: No  . Drug use: No  . Sexual activity: Not on file  Other Topics Concern  . Not on file  Social History Narrative   ** Merged History Encounter **       Review of Systems  Constitutional: Negative for fatigue and unexpected weight change.  Eyes: Negative for visual disturbance.  Respiratory: Negative for cough, chest tightness and shortness of breath.   Cardiovascular: Negative for chest pain, palpitations and leg swelling.  Gastrointestinal: Negative for abdominal pain and blood in stool.  Neurological: Positive for numbness (tingling). Negative for dizziness, light-headedness and headaches.      Objective:   Physical Exam  Constitutional: He is oriented to person, place, and time. He appears well-developed and well-nourished.  HENT:  Head: Normocephalic and atraumatic.  Eyes: EOM are normal. Pupils are equal, round, and reactive to light.  Neck: No JVD present. Carotid bruit is not present.  Cardiovascular: Normal rate, regular rhythm and normal heart sounds.  No murmur heard. Pulmonary/Chest: Effort normal and breath sounds normal. He has no rales.  Musculoskeletal: He exhibits no edema.  Cap refill less than 1 sec. Skin intact. Decreased sensation in tips of toes. Remainder of foot sensation intact. Currently sensation intact at fingertips.  Neurological: He is alert and oriented to person, place, and time.   Skin: Skin is warm and dry.  Psychiatric: He has a normal mood and affect.  Vitals reviewed.  Vitals:   05/31/17 0945  BP: (!) 142/72  Pulse: 62  Resp: 16  Temp: 98 F (36.7 C)  TempSrc: Oral  SpO2: 98%  Weight: 269 lb (122 kg)  Height: 5' 10.47" (1.79 m)      Assessment & Plan:   James Garcia is a 62 y.o. male Essential hypertension - Plan: amLODipine (NORVASC) 5 MG tablet, Comprehensive metabolic panel  -Continue ARB, but add amlodipine 5 mg for improved control, CMP pending  Hyperlipidemia, unspecified hyperlipidemia type - Plan: Lipid panel, Comprehensive metabolic panel, atorvastatin (LIPITOR) 10 MG tablet  -With history of diabetes will start Lipitor,  check labs and may need to increase towards higher dose  Other polyneuropathy - Plan: gabapentin (NEURONTIN) 300 MG capsule, Vitamin B12, TSH Check vitamin B12 and TSH, suspected diabetic neuropathy. Start gabapentin 300 mg nightly was increased to twice daily in 1 week if tolerated and persistent symptoms.  Can adjust dose further if needed  Screen for colon cancer - Plan: Ambulatory referral to Gastroenterology  Type 2 diabetes mellitus with diabetic neuropathy, with long-term current use of insulin ([place])  -Continue follow-up with endocrinologist.  No med changes at present  Meds ordered this encounter  Medications  . gabapentin (NEURONTIN) 300 MG capsule    Sig: Take 1 capsule (300 mg total) by mouth 2 (two) times daily. Initially QHS for first week, then BID if tolerated.    Dispense:  60 capsule    Refill:  2  . amLODipine (NORVASC) 5 MG tablet    Sig: Take 1 tablet (5 mg total) by mouth daily.    Dispense:  90 tablet    Refill:  1  . atorvastatin (LIPITOR) 10 MG tablet    Sig: Take 1 tablet (10 mg total) by mouth daily.    Dispense:  90 tablet    Refill:  1   Patient Instructions   For burning and tingling in feet and hands I suspect that is diabetic neuropathy, but will check other tests as well.   Try  the gabapentin once at night for the first week, then twice per day if still having symptoms but no side effects. Recheck this issue in next 4-6 weeks.   For blood pressure, continue losartan once per day but add amlodipine once per day. Recheck levels in next 6 weeks. Return to the clinic or go to the nearest emergency room if any of your symptoms worsen or new symptoms occur.  I will check cholesterol today, but start Lipitor once per day for now. We may need to increase that dose but can start with low dose initially.   Thank you for coming in today.    IF you received an x-ray today, you will receive an invoice from [facility] Rad[facility]. Please contact [facility] at [phone] with questions or concerns regarding your invoice.   IF you received labwork today, you will receive an invoice from [place]. Please contact [facility] at [phone] with questions or concerns regarding your invoice.   Our billing staff will not be able to assist you with questions regarding bills from these companies.  You will be contacted with the lab results as soon as they are available. The fastest way to get your results is to activate your My Chart account. Instructions are located on the last page of this paperwork. If you have not heard from [place] regarding the results in 2 weeks, please contact this office.       I personally performed the services described in this documentation, which was scribed in my presence. The recorded information has been reviewed and considered for accuracy and completeness, addended by me as needed, and agree with information above.  Signed,   Merri Ray, MD Primary Care at [place].  06/03/17 8:26 AM

## 2017-06-01 LAB — COMPREHENSIVE METABOLIC PANEL
A/G RATIO: 1.6 (ref 1.2–2.2)
ALBUMIN: 4.1 g/dL (ref 3.6–4.8)
ALT: 42 IU/L (ref 0–44)
AST: 22 IU/L (ref 0–40)
Alkaline Phosphatase: 80 IU/L (ref 39–117)
BUN / CREAT RATIO: 20 (ref 10–24)
BUN: 15 mg/dL (ref 8–27)
Bilirubin Total: 0.6 mg/dL (ref 0.0–1.2)
CALCIUM: 9 mg/dL (ref 8.6–10.2)
CO2: 23 mmol/L (ref 20–29)
CREATININE: 0.76 mg/dL (ref 0.76–1.27)
Chloride: 100 mmol/L (ref 96–106)
GFR, EST AFRICAN AMERICAN: 113 mL/min/{1.73_m2} (ref >59)
GFR, EST NON AFRICAN AMERICAN: 98 mL/min/{1.73_m2} (ref >59)
GLOBULIN, TOTAL: 2.6 g/dL (ref 1.5–4.5)
Glucose: 138 mg/dL — ABNORMAL HIGH (ref 65–99)
POTASSIUM: 4.4 mmol/L (ref 3.5–5.2)
SODIUM: 140 mmol/L (ref 134–144)
TOTAL PROTEIN: 6.7 g/dL (ref 6.0–8.5)

## 2017-06-01 LAB — VITAMIN B12: Vitamin B-12: 397 pg/mL (ref 232–1245)

## 2017-06-01 LAB — LIPID PANEL
CHOLESTEROL TOTAL: 193 mg/dL (ref 100–199)
Chol/HDL Ratio: 4 ratio (ref 0.0–5.0)
HDL: 48 mg/dL (ref >39)
LDL Calculated: 130 mg/dL — ABNORMAL HIGH (ref 0–99)
TRIGLYCERIDES: 75 mg/dL (ref 0–149)
VLDL Cholesterol Cal: 15 mg/dL (ref 5–40)

## 2017-06-01 LAB — TSH: TSH: 2.45 u[IU]/mL (ref 0.450–4.500)

## 2017-06-08 ENCOUNTER — Encounter: Payer: Self-pay | Admitting: Internal Medicine

## 2017-06-13 ENCOUNTER — Encounter: Payer: Self-pay | Admitting: *Deleted

## 2017-06-16 MED FILL — JANUMET XR 50-1,000 MG TAB: 50-1000 | 30 days supply | Qty: 60 | Fill #11

## 2017-06-17 MED FILL — LEVEMIR FLEXTOUCH 100 UNITS: 100 | 30 days supply | Qty: 24 | Fill #2

## 2017-07-04 MED FILL — UNIFINE PENTIPS 8MM 31G: 31G X 8 MM | 50 days supply | Qty: 100 | Fill #0

## 2017-07-12 ENCOUNTER — Other Ambulatory Visit: Payer: Self-pay

## 2017-07-12 ENCOUNTER — Encounter: Payer: Self-pay | Admitting: Family Medicine

## 2017-07-12 ENCOUNTER — Ambulatory Visit (INDEPENDENT_AMBULATORY_CARE_PROVIDER_SITE_OTHER): Payer: 59 | Admitting: Family Medicine

## 2017-07-12 VITALS — BP 128/74 | HR 67 | Temp 97.9°F | Ht 70.0 in | Wt 269.0 lb

## 2017-07-12 DIAGNOSIS — I10 Essential (primary) hypertension: Secondary | ICD-10-CM

## 2017-07-12 DIAGNOSIS — G6289 Other specified polyneuropathies: Secondary | ICD-10-CM

## 2017-07-12 DIAGNOSIS — E785 Hyperlipidemia, unspecified: Secondary | ICD-10-CM

## 2017-07-12 DIAGNOSIS — R5383 Other fatigue: Secondary | ICD-10-CM

## 2017-07-12 MED ORDER — BLOOD PRESSURE MONITOR KIT: PACK | 0 refills | Status: AC

## 2017-07-12 MED ORDER — GABAPENTIN 100 MG PO CAPS: 100.0000 mg | ORAL_CAPSULE | Freq: Two times a day (BID) | ORAL | 2 refills | Status: DC

## 2017-07-12 MED FILL — LOSARTAN POTASSIUM 100 MG T: 100 | 30 days supply | Qty: 30 | Fill #1

## 2017-07-12 MED FILL — GABAPENTIN 100 MG CAP: 100 | 30 days supply | Qty: 60 | Fill #0

## 2017-07-12 NOTE — Progress Notes (Signed)
Subjective:  By signing my name below, I, Essence Howell, attest that this documentation has been prepared under the direction and in the presence of Wendie Agreste, MD Electronically Signed: Ladene Artist, ED Scribe 07/12/2017 at 9:59 AM.   Patient ID: James Garcia, male    DOB: 1955/11/08, 62 y.o.   MRN: [id number]  Chief Complaint  Patient presents with  . Chronic Condition    6 week follow up    HPI James Garcia is a 62 y.o. male who presents to Primary Care at [facility] for a f/u. Pt is fasting at this visit.  DM Lab Results  Component Value Date   HGBA1C 8.0 04/26/2017  Followed by Dr. Loanne Drilling. Last OV in Feb. Here in Mar to establish care with me. Complicated by polyneuropathy and retinopathy. Insulin since 2016. Started gabapentin 300 mg bid last visit. - Pt has noticed some fatigue. Home glucose readings in the 200s. He does report that gabapentin has improved neuropathy but he has noticed fatigue with this as well. Denies hypoglycemic episodes. Pt has an appointment with Dr. Loanne Drilling today.  HTN Was on Losartan 100 mg qd. Added amlodipine 5 mg last visit. - Pt denies cp, sob, any other symptoms. He has been compliant with meds.  Hyperlipidemia Lab Results  Component Value Date   CHOL 193 05/31/2017   HDL 48 05/31/2017   LDLCALC 130 (H) 05/31/2017   TRIG 75 05/31/2017   CHOLHDL 4.0 05/31/2017   Lab Results  Component Value Date   ALT 42 05/31/2017   AST 22 05/31/2017   ALKPHOS 80 05/31/2017   BILITOT 0.6 05/31/2017  Started Lipitor last visit 10 mg qd. - Denies myalgias or new side-effects.  Patient Active Problem List   Diagnosis Date Noted  . Screening for prostate cancer 12/24/2016  . Diabetes ([place]) 05/01/2015  . Thrombocytopenia ([place]) 06/04/2014  . TB (tuberculosis), treated 03/31/2013  . Cough 03/30/2013   Past Medical History:  Diagnosis Date  . Diabetes mellitus without complication ([place])   . DM type 2 (diabetes mellitus, type 2) ([place])   .  Hypertension   . Tuberculin skin test (TST) positive    Past Surgical History:  Procedure Laterality Date  . arm surgery    . ELBOW SURGERY     No Known Allergies Prior to Admission medications   Medication Sig Start Date End Date Taking? Authorizing Provider  amLODipine (NORVASC) 5 MG tablet Take 1 tablet (5 mg total) by mouth daily. 05/31/17   Wendie Agreste, MD  atorvastatin (LIPITOR) 10 MG tablet Take 1 tablet (10 mg total) by mouth daily. 05/31/17   Wendie Agreste, MD  Blood Glucose Monitoring Suppl (FREESTYLE FREEDOM LITE) w/Device KIT Use to check blood sugar 2 times per day. Dx Code E11.9 06/23/16   Renato Shin, MD  gabapentin (NEURONTIN) 300 MG capsule Take 1 capsule (300 mg total) by mouth 2 (two) times daily. Initially QHS for first week, then BID if tolerated. 05/31/17   Wendie Agreste, MD  glucose blood (FREESTYLE LITE) test strip Use to check blood sugar 2 times per day. Dx Code E11.9 06/23/16   Renato Shin, MD  Insulin Detemir (LEVEMIR FLEXTOUCH) 100 UNIT/ML Pen Inject 80 Units into the skin every morning. And pen needles 2/day 04/26/17   Renato Shin, MD  Lancets (FREESTYLE) lancets Use to check blood sugar 2 times per day. Dx Code E11.9 06/23/16   Renato Shin, MD  losartan (COZAAR) 100 MG tablet Take 100 mg  by mouth daily. 04/08/17   [provider]  omeprazole (PRILOSEC) 20 MG capsule Take 20 mg by mouth every morning. 04/11/17   [provider]  SitaGLIPtin-MetFORMIN HCl (JANUMET XR) 50-1000 MG TB24 Take 2 tablets by mouth every morning. 06/22/16   Renato Shin, MD  UNIFINE PENTIPS 31G X 8 MM MISC Use to inject insulin 1 time per day 06/01/16   Renato Shin, MD   Social History   Socioeconomic History  . Marital status: Single    Spouse name: Not on file  . Number of children: Not on file  . Years of education: Not on file  . Highest education level: Not on file  Occupational History  . Occupation: Engineer, maintenance (IT) at [employer]  .  Financial resource strain: Not on file  . Food insecurity:    Worry: Not on file    Inability: Not on file  . Transportation needs:    Medical: Not on file    Non-medical: Not on file  Tobacco Use  . Smoking status: Former Smoker    Packs/day: 0.25    Years: 5.00    Pack years: 1.25    Types: Cigarettes    Last attempt to quit: 06/04/1978    Years since quitting: 39.1  . Smokeless tobacco: Never Used  Substance and Sexual Activity  . Alcohol use: No  . Drug use: No  . Sexual activity: Not on file  Lifestyle  . Physical activity:    Days per week: Not on file    Minutes per session: Not on file  . Stress: Not on file  Relationships  . Social connections:    Talks on phone: Not on file    Gets together: Not on file    Attends religious service: Not on file    Active member of club or organization: Not on file    Attends meetings of clubs or organizations: Not on file    Relationship status: Not on file  . Intimate partner violence:    Fear of current or ex partner: Not on file    Emotionally abused: Not on file    Physically abused: Not on file    Forced sexual activity: Not on file  Other Topics Concern  . Not on file  Social History Narrative   ** Merged History Encounter **       Review of Systems  Constitutional: Positive for fatigue. Negative for unexpected weight change.  Eyes: Negative for visual disturbance.  Respiratory: Negative for cough, chest tightness and shortness of breath.   Cardiovascular: Negative for chest pain, palpitations and leg swelling.  Gastrointestinal: Negative for abdominal pain and blood in stool.  Musculoskeletal: Negative for myalgias.  Neurological: Negative for dizziness, light-headedness and headaches.      Objective:   Physical Exam  Constitutional: He is oriented to person, place, and time. He appears well-developed and well-nourished. No distress.  HENT:  Head: Normocephalic and atraumatic.  Eyes: Pupils are equal, round,  and reactive to light. Conjunctivae and EOM are normal.  Neck: Neck supple. No JVD present. Carotid bruit is not present. No tracheal deviation present.  Cardiovascular: Normal rate, regular rhythm and normal heart sounds.  No murmur heard. Pulmonary/Chest: Effort normal and breath sounds normal. No respiratory distress. He has no rales.  Musculoskeletal: Normal range of motion. He exhibits no edema.  Neurological: He is alert and oriented to person, place, and time.  Skin: Skin is warm and dry.  Psychiatric: He  has a normal mood and affect. His behavior is normal.  Nursing note and vitals reviewed.  Vitals:   07/12/17 0926 07/12/17 1007  BP: 140/70 128/74  Pulse: 67   Temp: 97.9 F (36.6 C)   TempSrc: Oral   SpO2: 97%   Weight: 269 lb (122 kg)   Height: 5' 10" (1.778 m)       Assessment & Plan:   James Garcia is a 62 y.o. male Other polyneuropathy - Plan: gabapentin (NEURONTIN) 100 MG capsule Fatigue, unspecified type  -Improved neuropathy symptoms, but has some fatigue with 300 mg twice daily dosing of gabapentin.  Will decrease to 100 mg twice daily for now, then can try titrating if needed as tolerated.  Also recommended discussing fatigue with his endocrinologist as some persistent readings in the 200s may also contribute.  RTC precautions if worsening.  Essential hypertension  -Improved on recheck, continue amlodipine, losartan same doses  Hyperlipidemia, unspecified hyperlipidemia type - Plan: Comprehensive metabolic panel, Lipid panel  -Appears to be tolerating atorvastatin, continue same dose, repeat testing, follow-up in 3 months  Meds ordered this encounter  Medications  . gabapentin (NEURONTIN) 100 MG capsule    Sig: Take 1 capsule (100 mg total) by mouth 2 (two) times daily.    Dispense:  60 capsule    Refill:  2   Patient Instructions   I change the gabapentin to a lower dose of 100 mg twice per day to see if that causes less fatigue.  If that is not  helping the nerve pain, we may need to move up to a higher dose.  Please also discuss fatigue with your endocrinologist as part of that may be related to your blood sugar.  I will check your cholesterol levels and liver tests on the new cholesterol medicine today.  No change in dose for now.  Keep a record of your blood pressures outside of the office but repeat testing today looks good.   Thank you for coming in today, follow-up in 3 months  IF you received an x-ray today, you will receive an invoice from [facility]. Please contact [facility] Radiology at [phone] with questions or concerns regarding your invoice.   IF you received labwork today, you will receive an invoice from [place]. Please contact [facility] at [phone] with questions or concerns regarding your invoice.   Our billing staff will not be able to assist you with questions regarding bills from these companies.  You will be contacted with the lab results as soon as they are available. The fastest way to get your results is to activate your My Chart account. Instructions are located on the last page of this paperwork. If you have not heard from [place] regarding the results in 2 weeks, please contact this office.       I personally performed the services described in this documentation, which was scribed in my presence. The recorded information has been reviewed and considered for accuracy and completeness, addended by me as needed, and agree with information above.  Signed,   Merri Ray, MD Primary Care at [place].  07/12/17 10:09 AM

## 2017-07-12 NOTE — Patient Instructions (Addendum)
I change the gabapentin to a lower dose of 100 mg twice per day to see if that causes less fatigue.  If that is not helping the nerve pain, we may need to move up to a higher dose.  Please also discuss fatigue with your endocrinologist as part of that may be related to your blood sugar.  I will check your cholesterol levels and liver tests on the new cholesterol medicine today.  No change in dose for now.  Keep a record of your blood pressures outside of the office but repeat testing today looks good.   Thank you for coming in today, follow-up in 3 months  IF you received an x-ray today, you will receive an invoice from [facility]. Please contact [facility] Radiology at [phone] with questions or concerns regarding your invoice.   IF you received labwork today, you will receive an invoice from [place]. Please contact [facility] at [phone] with questions or concerns regarding your invoice.   Our billing staff will not be able to assist you with questions regarding bills from these companies.  You will be contacted with the lab results as soon as they are available. The fastest way to get your results is to activate your My Chart account. Instructions are located on the last page of this paperwork. If you have not heard from [place] regarding the results in 2 weeks, please contact this office.

## 2017-07-12 NOTE — Addendum Note (Signed)
Addended by: Merri Ray R on: 07/12/2017 10:47 AM   Modules accepted: Orders

## 2017-07-13 LAB — COMPREHENSIVE METABOLIC PANEL
ALBUMIN: 4.5 g/dL (ref 3.6–4.8)
ALK PHOS: 78 IU/L (ref 39–117)
ALT: 24 IU/L (ref 0–44)
AST: 20 IU/L (ref 0–40)
Albumin/Globulin Ratio: 1.9 (ref 1.2–2.2)
BILIRUBIN TOTAL: 0.7 mg/dL (ref 0.0–1.2)
BUN / CREAT RATIO: 19 (ref 10–24)
BUN: 15 mg/dL (ref 8–27)
CHLORIDE: 100 mmol/L (ref 96–106)
CO2: 22 mmol/L (ref 20–29)
CREATININE: 0.81 mg/dL (ref 0.76–1.27)
Calcium: 9.5 mg/dL (ref 8.6–10.2)
GFR calc non Af Amer: 95 mL/min/{1.73_m2} (ref >59)
GFR, EST AFRICAN AMERICAN: 110 mL/min/{1.73_m2} (ref >59)
GLOBULIN, TOTAL: 2.4 g/dL (ref 1.5–4.5)
Glucose: 124 mg/dL — ABNORMAL HIGH (ref 65–99)
Potassium: 4 mmol/L (ref 3.5–5.2)
SODIUM: 139 mmol/L (ref 134–144)
TOTAL PROTEIN: 6.9 g/dL (ref 6.0–8.5)

## 2017-07-13 LAB — LIPID PANEL
CHOLESTEROL TOTAL: 154 mg/dL (ref 100–199)
Chol/HDL Ratio: 3.2 ratio (ref 0.0–5.0)
HDL: 48 mg/dL (ref >39)
LDL CALC: 91 mg/dL (ref 0–99)
Triglycerides: 76 mg/dL (ref 0–149)
VLDL Cholesterol Cal: 15 mg/dL (ref 5–40)

## 2017-07-14 ENCOUNTER — Other Ambulatory Visit: Payer: Self-pay | Admitting: Endocrinology

## 2017-07-15 MED FILL — JANUMET XR 50-1,000 MG TAB: 50-1000 | 30 days supply | Qty: 60 | Fill #0

## 2017-07-20 ENCOUNTER — Telehealth: Payer: Self-pay | Admitting: Family Medicine

## 2017-07-20 MED ORDER — OMEPRAZOLE 20 MG PO CPDR: 20.0000 mg | DELAYED_RELEASE_CAPSULE | Freq: Every morning | ORAL | 1 refills | Status: DC

## 2017-07-20 NOTE — Telephone Encounter (Signed)
Please advise on omeprazole rx.

## 2017-07-20 NOTE — Telephone Encounter (Signed)
Copied from [place] [id number]. Topic: Quick Communication - Rx Refill/Question >> Jul 20, 2017 10:47 AM Margot Ables wrote: Medication: omeprazole 20mg  1/day - pt has 1 left for today - he states pharmacy faxed last Friday 07/15/17 - ordered by previous doctor Has the patient contacted their pharmacy? Yes - told to call office Preferred Pharmacy (with phone number or street name): [place], [place] - [address] [facility]. [address] [place] [address] Phone: [phone] Fax: [phone]

## 2017-07-21 MED FILL — OMEPRAZOLE 20 MG CAP: 20 | 90 days supply | Qty: 90 | Fill #0

## 2017-07-25 ENCOUNTER — Ambulatory Visit (INDEPENDENT_AMBULATORY_CARE_PROVIDER_SITE_OTHER): Payer: 59 | Admitting: Endocrinology

## 2017-07-25 ENCOUNTER — Encounter: Payer: Self-pay | Admitting: Endocrinology

## 2017-07-25 VITALS — BP 132/80 | HR 84 | Wt 269.6 lb

## 2017-07-25 DIAGNOSIS — E119 Type 2 diabetes mellitus without complications: Secondary | ICD-10-CM | POA: Diagnosis not present

## 2017-07-25 DIAGNOSIS — Z794 Long term (current) use of insulin: Secondary | ICD-10-CM

## 2017-07-25 LAB — POCT GLYCOSYLATED HEMOGLOBIN (HGB A1C): Hemoglobin A1C: 8.4

## 2017-07-25 MED ORDER — INSULIN DETEMIR 100 UNIT/ML FLEXPEN: 90.0000 [IU] | PEN_INJECTOR | SUBCUTANEOUS | 11 refills | Status: DC

## 2017-07-25 MED FILL — LEVEMIR FLEXTOUCH 100 UNITS: 100 | 30 days supply | Qty: 24 | Fill #3

## 2017-07-25 NOTE — Patient Instructions (Addendum)
Your blood pressure is high today.  Please see your primary care provider soon, to have it rechecked check your blood sugar twice a day.  vary the time of day when you check, between before the 3 meals, and at bedtime.  also check if you have symptoms of your blood sugar being too high or too low.  please keep a record of the readings and bring it to your next appointment here (or you can bring the meter itself).  You can write it on any piece of paper.  please call us sooner if your blood sugar goes below 70, or if you have a lot of readings over 200. Please continue the same janumet, and:  Increase the levemir to 90 units each morning.  Please come back for a follow-up appointment in 2 months.

## 2017-07-25 NOTE — Progress Notes (Signed)
Subjective:    Patient ID: James Garcia, male    DOB: February 22, 1956, 62 y.o.   MRN: [id number]  HPI Pt returns for f/u of diabetes mellitus: DM type: Insulin-requiring type 2 Dx'ed: 2563 Complications: polyneuropathy and DR Therapy: insulin since 2016, and janumet.  DKA: never Severe hypoglycemia: never Pancreatitis: never Other: he declines multiple daily injections; he works 2nd shift. QD insulin was changed to levemir, due to pattern of cbg's.   Interval history: no cbg record, but states it varies from 72-250.  There is no trend throughout the day.  pt states he feels well in general.   Past Medical History:  Diagnosis Date  . Diabetes mellitus without complication ([place])   . DM type 2 (diabetes mellitus, type 2) ([place])   . Hypertension   . 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 arm surgery    . ELBOW SURGERY      Social History   Socioeconomic History  . Marital status: Single    Spouse name: Not on file  . Number of children: Not on file  . Years of education: Not on file  . Highest education level: Not on file  Occupational History  . Occupation: Engineer, maintenance (IT) at [employer]  . Financial resource strain: Not on file  . Food insecurity:    Worry: Not on file    Inability: Not on file  . Transportation needs:    Medical: Not on file    Non-medical: Not on file  Tobacco Use  . Smoking status: Former Smoker    Packs/day: 0.25    Years: 5.00    Pack years: 1.25    Types: Cigarettes    Last attempt to quit: 06/04/1978    Years since quitting: 39.1  . Smokeless tobacco: Never Used  Substance and Sexual Activity  . Alcohol use: No  . Drug use: No  . Sexual activity: Not on file  Lifestyle  . Physical activity:    Days per week: Not on file    Minutes per session: Not on file  . Stress: Not on file  Relationships  . Social connections:    Talks on phone: Not on file    Gets together: Not on file      Attends religious service: Not on file    Active member of club or organization: Not on file    Attends meetings of clubs or organizations: Not on file    Relationship status: Not on file  . Intimate partner violence:    Fear of current or ex partner: Not on file    Emotionally abused: Not on file    Physically abused: Not on file    Forced sexual activity: Not on file  Other Topics Concern  . Not on file  Social History Narrative   ** Merged History Encounter **        Current Outpatient Medications on File Prior to Visit  Medication Sig Dispense Refill  . amLODipine (NORVASC) 5 MG tablet Take 1 tablet (5 mg total) by mouth daily. 90 tablet 1  . atorvastatin (LIPITOR) 10 MG tablet Take 1 tablet (10 mg total) by mouth daily. 90 tablet 1  . Blood Pressure Monitor KIT Use as instructed for HTN 1 each 0  . gabapentin (NEURONTIN) 100 MG capsule Take 1 capsule (100 mg total) by mouth 2 (two) times daily. 60 capsule 2  . glucose blood (  FREESTYLE LITE) test strip Use to check blood sugar 2 times per day. Dx Code E11.9 200 each 2  . JANUMET XR 50-1000 MG TB24 TAKE 2 TABLETS BY MOUTH EVERY MORNING. 60 tablet 11  . Lancets (FREESTYLE) lancets Use to check blood sugar 2 times per day. Dx Code E11.9 200 each 2  . losartan (COZAAR) 100 MG tablet Take 100 mg by mouth daily.  3  . omeprazole (PRILOSEC) 20 MG capsule Take 1 capsule (20 mg total) by mouth every morning. 90 capsule 1  . UNIFINE PENTIPS 31G X 8 MM MISC Use to inject insulin 1 time per day 30 each 0   No current facility-administered medications on file prior to visit.     No Known Allergies  Family History  Problem Relation Age of Onset  . Diabetes Father     BP 132/80   Pulse 84   Wt 269 lb 9.6 oz (122.3 kg)   SpO2 95%   BMI 38.68 kg/m    Review of Systems He denies hypoglycemia.      Objective:   Physical Exam VITAL SIGNS:  See vs page.  GENERAL: no distress.  Pulses: dorsalis pedis intact bilat.   MSK: no  deformity of the feet CV: no leg edema.   Skin:  no ulcer on the feet, but the skin is dry.  normal color and temp on the feet.  Old healed surgical scars on the left foot, dorsal aspect.  Neuro: sensation is intact to touch on the feet, but decreased from normal. Ext: There is bilateral onychomycosis of the toenails.    Lab Results  Component Value Date   HGBA1C 8.4 07/25/2017       Assessment & Plan:  HTN: is noted today Insulin-requiring type 2 DM, with DR: he needs increased rx.     Patient Instructions  Your blood pressure is high today.  Please see your primary care provider soon, to have it rechecked check your blood sugar twice a day.  vary the time of day when you check, between before the 3 meals, and at bedtime.  also check if you have symptoms of your blood sugar being too high or too low.  please keep a record of the readings and bring it to your next appointment here (or you can bring the meter itself).  You can write it on any piece of paper.  please call us sooner if your blood sugar goes below 70, or if you have a lot of readings over 200. Please continue the same janumet, and:  Increase the levemir to 90 units each morning.  Please come back for a follow-up appointment in 2 months.

## 2017-08-01 ENCOUNTER — Ambulatory Visit (AMBULATORY_SURGERY_CENTER): Payer: Self-pay | Admitting: *Deleted

## 2017-08-01 ENCOUNTER — Other Ambulatory Visit: Payer: Self-pay

## 2017-08-01 VITALS — Ht 72.0 in | Wt 274.0 lb

## 2017-08-01 DIAGNOSIS — Z86010 Personal history of colonic polyps: Secondary | ICD-10-CM

## 2017-08-01 DIAGNOSIS — Z1211 Encounter for screening for malignant neoplasm of colon: Secondary | ICD-10-CM

## 2017-08-01 MED ORDER — NA SULFATE-K SULFATE-MG SULF 17.5-3.13-1.6 GM/177ML PO SOLN: 1.0000 | Freq: Once | ORAL | 0 refills | Status: AC

## 2017-08-01 MED FILL — SUPREP BOWEL PREP KIT: 17.5-3.13-1 | 1 days supply | Qty: 354 | Fill #0

## 2017-08-01 NOTE — Progress Notes (Signed)
No egg or soy allergy known to patient  No issues with past sedation with any surgeries  or procedures, no intubation problems  No diet pills per patient No home 02 use per patient  No blood thinners per patient  Pt states  issues with constipation - he says he goes every morning but small hard amounts and never feels empty - will do a 2 day Suprep  prep due to this issue  No A fib or A flutter  EMMI video sent to pt's e mail

## 2017-08-04 ENCOUNTER — Encounter: Payer: Self-pay | Admitting: Internal Medicine

## 2017-08-12 MED FILL — JANUMET XR 50-1,000 MG TAB: 50-1000 | 30 days supply | Qty: 60 | Fill #1

## 2017-08-12 MED FILL — GABAPENTIN 100 MG CAP: 100 | 30 days supply | Qty: 60 | Fill #1

## 2017-08-12 MED FILL — LOSARTAN POTASSIUM 100 MG T: 100 | 90 days supply | Qty: 90 | Fill #0

## 2017-08-12 MED FILL — AMLODIPINE BESYLATE 5 MG TA: 5 | 90 days supply | Qty: 90 | Fill #1

## 2017-08-12 MED FILL — ATORVASTATIN 10 MG TABLET: 10 | 90 days supply | Qty: 90 | Fill #1

## 2017-08-15 ENCOUNTER — Ambulatory Visit (AMBULATORY_SURGERY_CENTER): Payer: 59 | Admitting: Internal Medicine

## 2017-08-15 ENCOUNTER — Encounter: Payer: Self-pay | Admitting: Internal Medicine

## 2017-08-15 ENCOUNTER — Other Ambulatory Visit: Payer: Self-pay

## 2017-08-15 VITALS — BP 137/84 | HR 63 | Temp 99.3°F | Resp 14 | Ht 72.0 in | Wt 274.0 lb

## 2017-08-15 DIAGNOSIS — D122 Benign neoplasm of ascending colon: Secondary | ICD-10-CM

## 2017-08-15 DIAGNOSIS — D124 Benign neoplasm of descending colon: Secondary | ICD-10-CM

## 2017-08-15 DIAGNOSIS — Z86010 Personal history of colonic polyps: Secondary | ICD-10-CM

## 2017-08-15 DIAGNOSIS — Z1211 Encounter for screening for malignant neoplasm of colon: Secondary | ICD-10-CM | POA: Diagnosis not present

## 2017-08-15 DIAGNOSIS — D125 Benign neoplasm of sigmoid colon: Secondary | ICD-10-CM

## 2017-08-15 DIAGNOSIS — D123 Benign neoplasm of transverse colon: Secondary | ICD-10-CM

## 2017-08-15 DIAGNOSIS — K635 Polyp of colon: Secondary | ICD-10-CM | POA: Diagnosis not present

## 2017-08-15 MED ORDER — SODIUM CHLORIDE 0.9 % IV SOLN: 500.0000 mL | Freq: Once | INTRAVENOUS | Status: AC

## 2017-08-15 NOTE — Patient Instructions (Signed)
Impression/Recommendations:  Polyp handout given to patient. Hemorrhoid handout given to patient.  Resume previous diet. Continue present medications.  Repeat colonoscopy recommended for surveillance.  Date to be determined after pathology results reviewed.  YOU HAD AN ENDOSCOPIC PROCEDURE TODAY AT THE  ENDOSCOPY CENTER:   Refer to the procedure report that was given to you for any specific questions about what was found during the examination.  If the procedure report does not answer your questions, please call your gastroenterologist to clarify.  If you requested that your care partner not be given the details of your procedure findings, then the procedure report has been included in a sealed envelope for you to review at your convenience later.  YOU SHOULD EXPECT: Some feelings of bloating in the abdomen. Passage of more gas than usual.  Walking can help get rid of the air that was put into your GI tract during the procedure and reduce the bloating. If you had a lower endoscopy (such as a colonoscopy or flexible sigmoidoscopy) you may notice spotting of blood in your stool or on the toilet paper. If you underwent a bowel prep for your procedure, you may not have a normal bowel movement for a few days.  Please Note:  You might notice some irritation and congestion in your nose or some drainage.  This is from the oxygen used during your procedure.  There is no need for concern and it should clear up in a day or so.  SYMPTOMS TO REPORT IMMEDIATELY:   Following lower endoscopy (colonoscopy or flexible sigmoidoscopy):  Excessive amounts of blood in the stool  Significant tenderness or worsening of abdominal pains  Swelling of the abdomen that is new, acute  Fever of 100F or higher For urgent or emergent issues, a gastroenterologist can be reached at any hour by calling [phone].   DIET:  We do recommend a small meal at first, but then you may proceed to your regular diet.   Drink plenty of fluids but you should avoid alcoholic beverages for 24 hours.  ACTIVITY:  You should plan to take it easy for the rest of today and you should NOT DRIVE or use heavy machinery until tomorrow (because of the sedation medicines used during the test).    FOLLOW UP: Our staff will call the number listed on your records the next business day following your procedure to check on you and address any questions or concerns that you may have regarding the information given to you following your procedure. If we do not reach you, we will leave a message.  However, if you are feeling well and you are not experiencing any problems, there is no need to return our call.  We will assume that you have returned to your regular daily activities without incident.  If any biopsies were taken you will be contacted by phone or by letter within the next 1-3 weeks.  Please call us at [phone] if you have not heard about the biopsies in 3 weeks.    SIGNATURES/CONFIDENTIALITY: You and/or your care partner have signed paperwork which will be entered into your electronic medical record.  These signatures attest to the fact that that the information above on your After Visit Summary has been reviewed and is understood.  Full responsibility of the confidentiality of this discharge information lies with you and/or your care-partner. 

## 2017-08-15 NOTE — Progress Notes (Signed)
Pt's states no medical or surgical changes since previsit or office visit. 

## 2017-08-15 NOTE — Op Note (Signed)
[facility] Patient Name: James Garcia Procedure Date: 08/15/2017 10:53 AM MRN: [id number] Endoscopist: Jerene Bears , MD Age: 62 Referring MD:  Date of Birth: 11/28/1955 Gender: Male Account #: [id number] Procedure:                Colonoscopy Indications:              High risk colon cancer surveillance: Personal                            history of non-advanced adenoma, Last colonoscopy:                            2001 with Dr. Amedeo Plenty Medicines:                Monitored Anesthesia Care Procedure:                Pre-Anesthesia Assessment:                           - Prior to the procedure, a History and Physical                            was performed, and patient medications and                            allergies were reviewed. The patient's tolerance of                            previous anesthesia was also reviewed. The risks                            and benefits of the procedure and the sedation                            options and risks were discussed with the patient.                            All questions were answered, and informed consent                            was obtained. Prior Anticoagulants: The patient has                            taken no previous anticoagulant or antiplatelet                            agents. ASA Grade Assessment: III - A patient with                            severe systemic disease. After reviewing the risks                            and benefits, the patient was deemed in  satisfactory condition to undergo the procedure.                           After obtaining informed consent, the colonoscope                            was passed under direct vision. Throughout the                            procedure, the patient's blood pressure, pulse, and                            oxygen saturations were monitored continuously. The                            Colonoscope was introduced through the anus  and                            advanced to the cecum, identified by appendiceal                            orifice and ileocecal valve. The colonoscopy was                            performed without difficulty. The patient tolerated                            the procedure well. The quality of the bowel                            preparation was good. The ileocecal valve,                            appendiceal orifice, and rectum were photographed. Scope In: 11:00:27 AM Scope Out: 11:21:10 AM Scope Withdrawal Time: 0 hours 18 minutes 40 seconds  Total Procedure Duration: 0 hours 20 minutes 43 seconds  Findings:                 The digital rectal exam was normal.                           Two sessile polyps were found in the ascending                            colon. The polyps were 3 to 4 mm in size. These                            polyps were removed with a cold snare. Resection                            and retrieval were complete.                           A 8 mm polyp was found in the transverse  colon. The                            polyp was pedunculated. The polyp was removed with                            a cold snare. Resection and retrieval were complete.                           A 5 mm polyp was found in the transverse colon. The                            polyp was sessile. The polyp was removed with a                            cold snare. Resection and retrieval were complete.                           Three sessile polyps were found in the descending                            colon. The polyps were 5 to 6 mm in size. These                            polyps were removed with a cold snare. Resection                            and retrieval were complete.                           A 5 mm polyp was found in the sigmoid colon. The                            polyp was sessile. The polyp was removed with a                            cold snare. Resection and retrieval were  complete.                           Multiple small and large-mouthed diverticula were                            found in the sigmoid colon.                           Internal hemorrhoids were found during                            retroflexion. The hemorrhoids were small. Complications:            No immediate complications. Estimated Blood Loss:     Estimated blood loss was minimal. Impression:               - Two 3 to 4 mm  polyps in the ascending colon,                            removed with a cold snare. Resected and retrieved.                           - One 8 mm polyp in the transverse colon, removed                            with a cold snare. Resected and retrieved.                           - One 5 mm polyp in the transverse colon, removed                            with a cold snare. Resected and retrieved.                           - Three 5 to 6 mm polyps in the descending colon,                            removed with a cold snare. Resected and retrieved.                           - One 5 mm polyp in the sigmoid colon, removed with                            a cold snare. Resected and retrieved.                           - Moderate diverticulosis in the sigmoid colon.                           - Internal hemorrhoids. Recommendation:           - Patient has a contact number available for                            emergencies. The signs and symptoms of potential                            delayed complications were discussed with the                            patient. Return to normal activities tomorrow.                            Written discharge instructions were provided to the                            patient.                           - Resume previous diet.                           -  Continue present medications.                           - Await pathology results.                           - Repeat colonoscopy is recommended for                             surveillance. The colonoscopy date will be                            determined after pathology results from today's                            exam become available for review. Jerene Bears, MD 08/15/2017 11:25:10 AM This report has been signed electronically.

## 2017-08-15 NOTE — Progress Notes (Signed)
To PACU, VSS. Report to RN.tb 

## 2017-08-15 NOTE — Progress Notes (Signed)
Called to room to assist during endoscopic procedure.  Patient ID and intended procedure confirmed with present staff. Received instructions for my participation in the procedure from the performing physician.  

## 2017-08-16 ENCOUNTER — Telehealth: Payer: Self-pay | Admitting: *Deleted

## 2017-08-16 NOTE — Telephone Encounter (Signed)
  Follow up Call-  Call back number 08/15/2017  Post procedure Call Back phone  # [phone]  Permission to leave phone message Yes  Some recent data might be hidden     Patient questions:  Do you have a fever, pain , or abdominal swelling? No. Pain Score  0 *  Have you tolerated food without any problems? Yes.    Have you been able to return to your normal activities? Yes.    Do you have any questions about your discharge instructions: Diet   No. Medications  No. Follow up visit  No.  Do you have questions or concerns about your Care? No.  Actions: * If pain score is 4 or above: No action needed, pain <4.

## 2017-08-18 MED FILL — LEVEMIR FLEXTOUCH 100 UNITS: 100 | 30 days supply | Qty: 24 | Fill #4

## 2017-08-22 ENCOUNTER — Encounter: Payer: Self-pay | Admitting: Internal Medicine

## 2017-09-12 MED FILL — JANUMET XR 50-1,000 MG TAB: 50-1000 | 30 days supply | Qty: 60 | Fill #2

## 2017-09-12 MED FILL — LEVEMIR FLEXTOUCH 100 UNITS: 100 | 30 days supply | Qty: 24 | Fill #5

## 2017-10-04 ENCOUNTER — Ambulatory Visit (INDEPENDENT_AMBULATORY_CARE_PROVIDER_SITE_OTHER): Payer: 59 | Admitting: Endocrinology

## 2017-10-04 ENCOUNTER — Encounter: Payer: Self-pay | Admitting: Endocrinology

## 2017-10-04 VITALS — BP 136/82 | HR 83 | Ht 72.0 in | Wt 271.6 lb

## 2017-10-04 DIAGNOSIS — E119 Type 2 diabetes mellitus without complications: Secondary | ICD-10-CM | POA: Diagnosis not present

## 2017-10-04 DIAGNOSIS — Z794 Long term (current) use of insulin: Secondary | ICD-10-CM | POA: Diagnosis not present

## 2017-10-04 LAB — POCT GLYCOSYLATED HEMOGLOBIN (HGB A1C): Hemoglobin A1C: 8.7 % — AB (ref 4.0–5.6)

## 2017-10-04 MED ORDER — FREESTYLE LIBRE 14 DAY READER DEVI: 1.0000 | Freq: Once | 0 refills | Status: AC

## 2017-10-04 MED ORDER — INSULIN DETEMIR 100 UNIT/ML FLEXPEN: 110.0000 [IU] | PEN_INJECTOR | SUBCUTANEOUS | 11 refills | Status: DC

## 2017-10-04 MED ORDER — FREESTYLE LIBRE 14 DAY SENSOR MISC: 1.0000 | 3 refills | Status: DC

## 2017-10-04 MED FILL — LEVEMIR FLEXTOUCH 100 UNITS: 100 | 28 days supply | Qty: 30 | Fill #0

## 2017-10-04 NOTE — Progress Notes (Signed)
Subjective:    Patient ID: James Garcia, male    DOB: 12-08-1955, 62 y.o.   MRN: [id number]  HPI Pt returns for f/u of diabetes mellitus: DM type: Insulin-requiring type 2 Dx'ed: 3295 Complications: polyneuropathy and DR Therapy: insulin since 2016, and janumet.  DKA: never Severe hypoglycemia: never Pancreatitis: never Other: he declines multiple daily injections; he works 2nd shift. QD insulin was changed to levemir, due to pattern of cbg's.   Interval history: no cbg record, but states it varies from 90-200's.  There is no trend throughout the day.  pt states he feels well in general.  Pt says he never misses the insulin.  Past Medical History:  Diagnosis Date  . Constipation    uses OTC laxatives - hard stools every morning- small amount and feels like doesnt empty   . Diabetes mellitus without complication ([place])   . DM type 2 (diabetes mellitus, type 2) ([place])   . Hyperlipidemia   . Hypertension   . Leg cramps   . 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 Tuberculosis    pt states was treated for TB    Past Surgical History:  Procedure Laterality Date  . arm surgery    . COLONOSCOPY    . ELBOW SURGERY    . FOOT SURGERY Bilateral   . POLYPECTOMY      Social History   Socioeconomic History  . Marital status: Single    Spouse name: Not on file  . Number of children: Not on file  . Years of education: Not on file  . Highest education level: Not on file  Occupational History  . Occupation: Engineer, maintenance (IT) at [employer]  . Financial resource strain: Not on file  . Food insecurity:    Worry: Not on file    Inability: Not on file  . Transportation needs:    Medical: Not on file    Non-medical: Not on file  Tobacco Use  . Smoking status: Former Smoker    Packs/day: 0.25    Years: 5.00    Pack years: 1.25    Types: Cigarettes    Last attempt to quit: 06/04/1978    Years since quitting: 39.3  . Smokeless tobacco: Never Used  Substance and Sexual Activity    . Alcohol use: No  . Drug use: No  . Sexual activity: Not on file  Lifestyle  . Physical activity:    Days per week: Not on file    Minutes per session: Not on file  . Stress: Not on file  Relationships  . Social connections:    Talks on phone: Not on file    Gets together: Not on file    Attends religious service: Not on file    Active member of club or organization: Not on file    Attends meetings of clubs or organizations: Not on file    Relationship status: Not on file  . Intimate partner violence:    Fear of current or ex partner: Not on file    Emotionally abused: Not on file    Physically abused: Not on file    Forced sexual activity: Not on file  Other Topics Concern  . Not on file  Social History Narrative   ** Merged History Encounter **        Current Outpatient Medications on File Prior to Visit  Medication Sig Dispense Refill  . amLODipine (NORVASC) 5 MG tablet Take 1 tablet (5 mg  total) by mouth daily. 90 tablet 1  . atorvastatin (LIPITOR) 10 MG tablet Take 1 tablet (10 mg total) by mouth daily. 90 tablet 1  . Blood Pressure Monitor KIT Use as instructed for HTN 1 each 0  . gabapentin (NEURONTIN) 100 MG capsule Take 1 capsule (100 mg total) by mouth 2 (two) times daily. 60 capsule 2  . glucose blood (FREESTYLE LITE) test strip Use to check blood sugar 2 times per day. Dx Code E11.9 200 each 2  . JANUMET XR 50-1000 MG TB24 TAKE 2 TABLETS BY MOUTH EVERY MORNING. 60 tablet 11  . Lancets (FREESTYLE) lancets Use to check blood sugar 2 times per day. Dx Code E11.9 200 each 2  . losartan (COZAAR) 100 MG tablet Take 100 mg by mouth daily.  3  . omeprazole (PRILOSEC) 20 MG capsule Take 1 capsule (20 mg total) by mouth every morning. 90 capsule 1   Current Facility-Administered Medications on File Prior to Visit  Medication Dose Route Frequency Provider Last Rate Last Dose  . 0.9 %  sodium chloride infusion  500 mL Intravenous Once Pyrtle, Lajuan Lines, MD        No Known  Allergies  Family History  Problem Relation Age of Onset  . Diabetes Father   . Colon cancer Neg Hx   . Colon polyps Neg Hx   . Rectal cancer Neg Hx   . Stomach cancer Neg Hx     BP 136/82 (BP Location: Left Arm, Patient Position: Sitting, Cuff Size: Normal)   Pulse 83   Ht 6' (1.829 m)   Wt 271 lb 9.6 oz (123.2 kg)   SpO2 96%   BMI 36.84 kg/m    Review of Systems He denies hypoglycemia.      Objective:   Physical Exam VITAL SIGNS:  See vs page.  GENERAL: no distress.  Pulses: dorsalis pedis intact bilat.   MSK: no deformity of the feet CV: trace bilat leg edema.   Skin:  no ulcer on the feet, but the skin is dry.  normal color and temp on the feet.  Old healed surgical scars on the left foot, dorsal aspect.  Neuro: sensation is intact to touch on the feet, but decreased from normal.   Ext: There is bilateral onychomycosis of the toenails.   Lab Results  Component Value Date   HGBA1C 8.7 (A) 10/04/2017        Assessment & Plan:  Insulin-requiring type 2 DM, with DR: worse Edema: this limits rx options  Patient Instructions  check your blood sugar twice a day.  vary the time of day when you check, between before the 3 meals, and at bedtime.  also check if you have symptoms of your blood sugar being too high or too low.  please keep a record of the readings and bring it to your next appointment here (or you can bring the meter itself).  You can write it on any piece of paper.  please call us sooner if your blood sugar goes below 70, or if you have a lot of readings over 200. Please continue the same janumet, and:  Increase the levemir to 100 units each morning.  On this type of insulin schedule, you should eat meals on a regular schedule.  If a meal is missed or significantly delayed, your blood sugar could go low.  I have sent a prescription to your pharmacy, for the freestyle [place] system.  If you need help using it, please let  us know, and we'll get you set up with  [facility]   Please come back for a follow-up appointment in 2 months.

## 2017-10-04 NOTE — Patient Instructions (Addendum)
check your blood sugar twice a day.  vary the time of day when you check, between before the 3 meals, and at bedtime.  also check if you have symptoms of your blood sugar being too high or too low.  please keep a record of the readings and bring it to your next appointment here (or you can bring the meter itself).  You can write it on any piece of paper.  please call us sooner if your blood sugar goes below 70, or if you have a lot of readings over 200. Please continue the same janumet, and:  Increase the levemir to 100 units each morning.  On this type of insulin schedule, you should eat meals on a regular schedule.  If a meal is missed or significantly delayed, your blood sugar could go low.  I have sent a prescription to your pharmacy, for the freestyle [place] system.  If you need help using it, please let us know, and we'll get you set up with [facility].   Please come back for a follow-up appointment in 2 months.

## 2017-10-05 MED FILL — GABAPENTIN 100 MG CAP: 100 | 30 days supply | Qty: 60 | Fill #2

## 2017-10-06 MED FILL — JANUMET XR 50-1,000 MG TAB: 50-1000 | 30 days supply | Qty: 60 | Fill #3

## 2017-10-10 MED FILL — UNIFINE PENTIPS 8MM 31G: 31G X 8 MM | 50 days supply | Qty: 100 | Fill #1

## 2017-10-11 ENCOUNTER — Ambulatory Visit: Payer: 59 | Admitting: Family Medicine

## 2017-10-15 ENCOUNTER — Other Ambulatory Visit: Payer: Self-pay

## 2017-10-15 ENCOUNTER — Encounter: Payer: Self-pay | Admitting: Family Medicine

## 2017-10-15 ENCOUNTER — Ambulatory Visit (INDEPENDENT_AMBULATORY_CARE_PROVIDER_SITE_OTHER): Payer: 59 | Admitting: Family Medicine

## 2017-10-15 VITALS — BP 128/76 | HR 67 | Temp 98.1°F | Ht 72.0 in | Wt 269.2 lb

## 2017-10-15 DIAGNOSIS — K219 Gastro-esophageal reflux disease without esophagitis: Secondary | ICD-10-CM

## 2017-10-15 DIAGNOSIS — I10 Essential (primary) hypertension: Secondary | ICD-10-CM | POA: Diagnosis not present

## 2017-10-15 DIAGNOSIS — K5900 Constipation, unspecified: Secondary | ICD-10-CM

## 2017-10-15 DIAGNOSIS — Z794 Long term (current) use of insulin: Secondary | ICD-10-CM

## 2017-10-15 DIAGNOSIS — E1142 Type 2 diabetes mellitus with diabetic polyneuropathy: Secondary | ICD-10-CM

## 2017-10-15 DIAGNOSIS — E785 Hyperlipidemia, unspecified: Secondary | ICD-10-CM | POA: Diagnosis not present

## 2017-10-15 MED ORDER — ATORVASTATIN CALCIUM 20 MG PO TABS
20.0000 mg | ORAL_TABLET | Freq: Every day | ORAL | 1 refills | Status: DC
Start: 2017-10-15 — End: 2018-01-31

## 2017-10-15 NOTE — Progress Notes (Signed)
Subjective:    Patient ID: James Garcia, male    DOB: 06-27-1955, 61 y.o.   MRN: [id number]  HPI James Garcia is a 62 y.o. male Presents today for: Chief Complaint  Patient presents with  . Chronic Condition    3 m f/u (BP & Blood sugar )  . Having GI issue    going on a month. the combination of constipation and diarreha. Doesnt feel like he empty the bowels when he goes   Here for follow-up of routine conditions today as well as acute concerns of constipation and diarrhea.   Hypertension: BP Readings from Last 3 Encounters:  10/15/17 128/76  10/04/17 136/82  08/15/17 137/84   Lab Results  Component Value Date   CREATININE 0.81 07/12/2017  He takes amlodipine 5 mg daily, losartan 100 mg daily. No new side effects.   Hyperlipidemia: Lab Results  Component Value Date   CHOL 154 07/12/2017   HDL 48 07/12/2017   LDLCALC 91 07/12/2017   TRIG 76 07/12/2017   CHOLHDL 3.2 07/12/2017   Lab Results  Component Value Date   ALT 24 07/12/2017   AST 20 07/12/2017   ALKPHOS 78 07/12/2017   BILITOT 0.7 07/12/2017  Takes Lipitor 10 mg daily. Goal LDL less than 70. occasional fatigue, no new myalgias. Rare charlie horse.   GERD: He has taken omeprazole 20 mg up to daily, but usually every few days to once per week. Symptoms controlled on episodic dosing.   Diabetes: Lab Results  Component Value Date   HGBA1C 8.7 (A) 10/04/2017   Wt Readings from Last 3 Encounters:  10/15/17 269 lb 3.2 oz (122.1 kg)  10/04/17 271 lb 9.6 oz (123.2 kg)  08/15/17 274 lb (124.3 kg)  He is followed by Dr. Loanne Drilling for diabetes, last office visit July 23.  Diabetes complicated by peripheral neuropathy.  Takes gabapentin, Janumet, Levemir.  Statin -on Lipitor Ace or ARB -takes losartan Last optho may 2018.   Constipation: Per problem list has suffered with constipation in the past and had recommended stool softeners.  Colonoscopy 08/15/2017 with Dr. Hilarie Fredrickson.  Diverticulosis, few hemorrhoids, and  polyps were removed. Constipation for past month. Small hard stools. No treatments. Rare diarrhea. Last BM this morning - small hard stool. No fever, abd pain or blood in stool.   Patient Active Problem List   Diagnosis Date Noted  . Screening for prostate cancer 12/24/2016  . Diabetes ([place]) 05/01/2015  . Thrombocytopenia ([place]) 06/04/2014  . TB (tuberculosis), treated 03/31/2013  . Cough 03/30/2013   Past Medical History:  Diagnosis Date  . Constipation    uses OTC laxatives - hard stools every morning- small amount and feels like doesnt empty   . Diabetes mellitus without complication ([place])   . DM type 2 (diabetes mellitus, type 2) ([place])   . Hyperlipidemia   . Hypertension   . Leg cramps   . 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 Tuberculosis    pt states was treated for TB   Past Surgical History:  Procedure Laterality Date  . arm surgery    . COLONOSCOPY    . ELBOW SURGERY    . FOOT SURGERY Bilateral   . POLYPECTOMY     No Known Allergies Prior to Admission medications   Medication Sig Start Date End Date Taking? Authorizing Provider  amLODipine (NORVASC) 5 MG tablet Take 1 tablet (5 mg total) by mouth daily. 05/31/17  Yes Wendie Agreste, MD  atorvastatin (LIPITOR)  10 MG tablet Take 1 tablet (10 mg total) by mouth daily. 05/31/17  Yes Wendie Agreste, MD  Blood Pressure Monitor KIT Use as instructed for HTN 07/12/17  Yes Wendie Agreste, MD  Continuous Blood Gluc Sensor (FREESTYLE LIBRE 14 DAY SENSOR) MISC 1 Device by Does not apply route every 14 (fourteen) days. 10/04/17  Yes Renato Shin, MD  gabapentin (NEURONTIN) 100 MG capsule Take 1 capsule (100 mg total) by mouth 2 (two) times daily. 07/12/17  Yes Wendie Agreste, MD  glucose blood (FREESTYLE LITE) test strip Use to check blood sugar 2 times per day. Dx Code E11.9 06/23/16  Yes Renato Shin, MD  Insulin Detemir (LEVEMIR FLEXTOUCH) 100 UNIT/ML Pen Inject 110 Units into the skin every morning. And pen needles  2/day 10/04/17  Yes Renato Shin, MD  JANUMET XR 50-1000 MG TB24 TAKE 2 TABLETS BY MOUTH EVERY MORNING. 07/15/17  Yes Renato Shin, MD  Lancets (FREESTYLE) lancets Use to check blood sugar 2 times per day. Dx Code E11.9 06/23/16  Yes Renato Shin, MD  losartan (COZAAR) 100 MG tablet Take 100 mg by mouth daily. 04/08/17  Yes [provider]  omeprazole (PRILOSEC) 20 MG capsule Take 1 capsule (20 mg total) by mouth every morning. 07/20/17  Yes Wendie Agreste, MD  UNIFINE PENTIPS 31G X 8 MM MISC USE TO INJECT INSULIN TWICE DAILY. 10/10/17   [provider]   Social History   Socioeconomic History  . Marital status: Single    Spouse name: Not on file  . Number of children: Not on file  . Years of education: Not on file  . Highest education level: Not on file  Occupational History  . Occupation: Engineer, maintenance (IT) at [employer]  . Financial resource strain: Not on file  . Food insecurity:    Worry: Not on file    Inability: Not on file  . Transportation needs:    Medical: Not on file    Non-medical: Not on file  Tobacco Use  . Smoking status: Former Smoker    Packs/day: 0.25    Years: 5.00    Pack years: 1.25    Types: Cigarettes    Last attempt to quit: 06/04/1978    Years since quitting: 39.3  . Smokeless tobacco: Never Used  Substance and Sexual Activity  . Alcohol use: No  . Drug use: No  . Sexual activity: Not on file  Lifestyle  . Physical activity:    Days per week: Not on file    Minutes per session: Not on file  . Stress: Not on file  Relationships  . Social connections:    Talks on phone: Not on file    Gets together: Not on file    Attends religious service: Not on file    Active member of club or organization: Not on file    Attends meetings of clubs or organizations: Not on file    Relationship status: Not on file  . Intimate partner violence:    Fear of current or ex partner: Not on file    Emotionally abused: Not on file    Physically  abused: Not on file    Forced sexual activity: Not on file  Other Topics Concern  . Not on file  Social History Narrative   ** Merged History Encounter **        Review of Systems  Constitutional: Negative for fatigue and unexpected weight change.  Eyes: Negative for visual  disturbance.  Respiratory: Negative for cough, chest tightness and shortness of breath.   Cardiovascular: Negative for chest pain, palpitations and leg swelling.  Gastrointestinal: Positive for constipation and diarrhea. Negative for abdominal pain, blood in stool and vomiting.  Neurological: Negative for dizziness, light-headedness and headaches.       Objective:   Physical Exam  Constitutional: He is oriented to person, place, and time. He appears well-developed and well-nourished.  HENT:  Head: Normocephalic and atraumatic.  Eyes: Pupils are equal, round, and reactive to light. EOM are normal.  Neck: No JVD present. Carotid bruit is not present.  Cardiovascular: Normal rate, regular rhythm and normal heart sounds.  No murmur heard. Pulmonary/Chest: Effort normal and breath sounds normal. He has no rales.  Musculoskeletal: He exhibits no edema.  Neurological: He is alert and oriented to person, place, and time.  Skin: Skin is warm and dry.  Psychiatric: He has a normal mood and affect.  Vitals reviewed.  Vitals:   10/15/17 1028  BP: 128/76  Pulse: 67  Temp: 98.1 F (36.7 C)  TempSrc: Oral  SpO2: 95%  Weight: 269 lb 3.2 oz (122.1 kg)  Height: 6' (1.829 m)      Assessment & Plan:    James Garcia is a 62 y.o. male Essential hypertension  -Stable, continue same regimen.  Previous labs noted.  Hyperlipidemia, unspecified hyperlipidemia type - Plan: atorvastatin (LIPITOR) 20 MG tablet  -Goal LDL less than 70, based on last reading will increase Lipitor to 20 mg daily.  Rare charley horse, may be related to hydration and less likely statin but if the symptoms increase or persist advised to  follow-up to discuss further.  If any worsening on higher dose of Lipitor, decrease back to 10 mg.  Recheck in 6 weeks for repeat labs  Type 2 diabetes mellitus with diabetic polyneuropathy, with long-term current use of insulin ([place])  -Followed by endocrinology.  Advised to schedule optometry/ophthalmology visit as he appears to be overdue  Constipation, unspecified constipation type  -No red flag symptoms at present.  Increase hydration, fiber in the diet, MiraLAX for improvement temporarily, then Colace as stool softener.  RTC precautions if not improving, and handout given  Gastroesophageal reflux disease, esophagitis presence not specified  -Stable with intermittent/rare dosing of PPI.  No changes.  Meds ordered this encounter  Medications  . atorvastatin (LIPITOR) 20 MG tablet    Sig: Take 1 tablet (20 mg total) by mouth daily.    Dispense:  90 tablet    Refill:  1   Patient Instructions   Increase fiber intake and water during the day. See info below. Try MiraLAX over-the-counter once per day until you have normal soft stools then you can switch to Colace which is a stool softener once per day. Return to the clinic or go to the nearest emergency room if any of your symptoms worsen or new symptoms occur. If not improving in next few weeks.   Schedule eye doctor visit (yearly with diabetes).   I would recommend trying higher dose of Lipitor to get LDL below 70.  Start new dose and recheck in 6 weeks to repeat labs. If any new muscle aches, return to prior dose and let me know.     Constipation, Adult Constipation is when a person has fewer bowel movements in a week than normal, has difficulty having a bowel movement, or has stools that are dry, hard, or larger than normal. Constipation may be caused by an underlying  condition. It may become worse with age if a person takes certain medicines and does not take in enough fluids. Follow these instructions at home: Eating and  drinking   Eat foods that have a lot of fiber, such as fresh fruits and vegetables, whole grains, and beans.  Limit foods that are high in fat, low in fiber, or overly processed, such as french fries, hamburgers, cookies, candies, and soda.  Drink enough fluid to keep your urine clear or pale yellow. General instructions  Exercise regularly or as told by your health care provider.  Go to the restroom when you have the urge to go. Do not hold it in.  Take over-the-counter and prescription medicines only as told by your health care provider. These include any fiber supplements.  Practice pelvic floor retraining exercises, such as deep breathing while relaxing the lower abdomen and pelvic floor relaxation during bowel movements.  Watch your condition for any changes.  Keep all follow-up visits as told by your health care provider. This is important. Contact a health care provider if:  You have pain that gets worse.  You have a fever.  You do not have a bowel movement after 4 days.  You vomit.  You are not hungry.  You lose weight.  You are bleeding from the anus.  You have thin, pencil-like stools. Get help right away if:  You have a fever and your symptoms suddenly get worse.  You leak stool or have blood in your stool.  Your abdomen is bloated.  You have severe pain in your abdomen.  You feel dizzy or you faint. This information is not intended to replace advice given to you by your health care provider. Make sure you discuss any questions you have with your health care provider. Document Released: 11/28/2003 Document Revised: 09/19/2015 Document Reviewed: 08/20/2015 Elsevier Interactive Patient Education  2018 [employer].     IF you received an x-ray today, you will receive an invoice from [facility]. Please contact [facility] at [phone] with questions or concerns regarding your invoice.   IF you received labwork today, you will  receive an invoice from [facility]. Please contact [facility] at [phone] with questions or concerns regarding your invoice.   Our billing staff will not be able to assist you with questions regarding bills from these companies.  You will be contacted with the lab results as soon as they are available. The fastest way to get your results is to activate your My Chart account. Instructions are located on the last page of this paperwork. If you have not heard from [place] regarding the results in 2 weeks, please contact this office.

## 2017-10-15 NOTE — Patient Instructions (Addendum)
Increase fiber intake and water during the day. See info below. Try MiraLAX over-the-counter once per day until you have normal soft stools then you can switch to Colace which is a stool softener once per day. Return to the clinic or go to the nearest emergency room if any of your symptoms worsen or new symptoms occur. If not improving in next few weeks.   Schedule eye doctor visit (yearly with diabetes).   I would recommend trying higher dose of Lipitor to get LDL below 70.  Start new dose and recheck in 6 weeks to repeat labs. If any new muscle aches, return to prior dose and let me know.     Constipation, Adult Constipation is when a person has fewer bowel movements in a week than normal, has difficulty having a bowel movement, or has stools that are dry, hard, or larger than normal. Constipation may be caused by an underlying condition. It may become worse with age if a person takes certain medicines and does not take in enough fluids. Follow these instructions at home: Eating and drinking   Eat foods that have a lot of fiber, such as fresh fruits and vegetables, whole grains, and beans.  Limit foods that are high in fat, low in fiber, or overly processed, such as french fries, hamburgers, cookies, candies, and soda.  Drink enough fluid to keep your urine clear or pale yellow. General instructions  Exercise regularly or as told by your health care provider.  Go to the restroom when you have the urge to go. Do not hold it in.  Take over-the-counter and prescription medicines only as told by your health care provider. These include any fiber supplements.  Practice pelvic floor retraining exercises, such as deep breathing while relaxing the lower abdomen and pelvic floor relaxation during bowel movements.  Watch your condition for any changes.  Keep all follow-up visits as told by your health care provider. This is important. Contact a health care provider if:  You have pain that  gets worse.  You have a fever.  You do not have a bowel movement after 4 days.  You vomit.  You are not hungry.  You lose weight.  You are bleeding from the anus.  You have thin, pencil-like stools. Get help right away if:  You have a fever and your symptoms suddenly get worse.  You leak stool or have blood in your stool.  Your abdomen is bloated.  You have severe pain in your abdomen.  You feel dizzy or you faint. This information is not intended to replace advice given to you by your health care provider. Make sure you discuss any questions you have with your health care provider. Document Released: 11/28/2003 Document Revised: 09/19/2015 Document Reviewed: 08/20/2015 Elsevier Interactive Patient Education  2018 [employer].     IF you received an x-ray today, you will receive an invoice from [facility]. Please contact [facility] at [phone] with questions or concerns regarding your invoice.   IF you received labwork today, you will receive an invoice from [facility]. Please contact [facility] at [phone] with questions or concerns regarding your invoice.   Our billing staff will not be able to assist you with questions regarding bills from these companies.  You will be contacted with the lab results as soon as they are available. The fastest way to get your results is to activate your My Chart account. Instructions are located on the last page of this paperwork. If you have not  heard from [place] regarding the results in 2 weeks, please contact this office.

## 2017-10-17 MED FILL — ATORVASTATIN CALCIUM 20 MG: 20 | 90 days supply | Qty: 90 | Fill #0

## 2017-11-02 ENCOUNTER — Other Ambulatory Visit: Payer: Self-pay | Admitting: Endocrinology

## 2017-11-02 ENCOUNTER — Telehealth: Payer: Self-pay

## 2017-11-02 MED FILL — FREESTYLE LITE TEST STRIP: 90 days supply | Qty: 200 | Fill #0

## 2017-11-02 MED FILL — LEVEMIR FLEXTOUCH 100 UNITS: 100 | 28 days supply | Qty: 30 | Fill #1

## 2017-11-02 NOTE — Telephone Encounter (Signed)
I have called & submitted PA over the phone with [employer]. I am waiting on response.

## 2017-11-10 ENCOUNTER — Other Ambulatory Visit: Payer: Self-pay | Admitting: Family Medicine

## 2017-11-10 DIAGNOSIS — G6289 Other specified polyneuropathies: Secondary | ICD-10-CM

## 2017-11-10 DIAGNOSIS — I10 Essential (primary) hypertension: Secondary | ICD-10-CM

## 2017-11-10 MED FILL — JANUMET XR 50-1,000 MG TAB: 50-1000 | 30 days supply | Qty: 60 | Fill #4

## 2017-11-10 MED FILL — AMLODIPINE BESYLATE 5 MG TA: 5 | 90 days supply | Qty: 90 | Fill #0

## 2017-11-10 MED FILL — LOSARTAN POTASSIUM 100 MG T: 100 | 30 days supply | Qty: 30 | Fill #1

## 2017-11-10 MED FILL — GABAPENTIN 100 MG CAP: 100 | 30 days supply | Qty: 60 | Fill #0

## 2017-11-10 MED FILL — FREESTYLE LANCETS: 90 days supply | Qty: 200 | Fill #0

## 2017-11-29 MED FILL — LEVEMIR FLEXTOUCH 100 UNITS: 100 | 28 days supply | Qty: 30 | Fill #2

## 2017-11-30 ENCOUNTER — Other Ambulatory Visit: Payer: Self-pay

## 2017-11-30 ENCOUNTER — Telehealth: Payer: Self-pay | Admitting: Family Medicine

## 2017-11-30 MED ORDER — LOSARTAN POTASSIUM 100 MG PO TABS: 100.0000 mg | ORAL_TABLET | Freq: Every day | ORAL | 0 refills | Status: DC

## 2017-11-30 NOTE — Telephone Encounter (Signed)
Copied from [place] [id number]. Topic: Quick Communication - Rx Refill/Question >> Nov 30, 2017 11:29 AM Selinda Flavin B, NT wrote: Medication: Losartan potassium 100mg   Has the patient contacted their pharmacy? Yes.   (Agent: If no, request that the patient contact the pharmacy for the refill.) (Agent: If yes, when and what did the pharmacy advise?)  Preferred Pharmacy (with phone number or street name): [place], [place]: Please be advised that RX refills may take up to 3 business days. We ask that you follow-up with your pharmacy.

## 2017-12-05 ENCOUNTER — Ambulatory Visit: Payer: Self-pay | Admitting: Endocrinology

## 2017-12-05 DIAGNOSIS — Z0289 Encounter for other administrative examinations: Secondary | ICD-10-CM

## 2017-12-05 MED FILL — LOSARTAN POTASSIUM 100 MG T: 100 | 90 days supply | Qty: 90 | Fill #0

## 2017-12-08 ENCOUNTER — Ambulatory Visit: Payer: Self-pay | Admitting: Endocrinology

## 2017-12-09 ENCOUNTER — Encounter: Payer: Self-pay | Admitting: Endocrinology

## 2017-12-09 ENCOUNTER — Ambulatory Visit (INDEPENDENT_AMBULATORY_CARE_PROVIDER_SITE_OTHER): Payer: 59 | Admitting: Endocrinology

## 2017-12-09 VITALS — BP 146/80 | HR 78 | Ht 72.0 in | Wt 267.3 lb

## 2017-12-09 DIAGNOSIS — Z794 Long term (current) use of insulin: Secondary | ICD-10-CM | POA: Diagnosis not present

## 2017-12-09 DIAGNOSIS — E119 Type 2 diabetes mellitus without complications: Secondary | ICD-10-CM | POA: Diagnosis not present

## 2017-12-09 LAB — POCT GLYCOSYLATED HEMOGLOBIN (HGB A1C): Hemoglobin A1C: 7.9 % — AB (ref 4.0–5.6)

## 2017-12-09 MED ORDER — DAPAGLIFLOZIN PROPANEDIOL 5 MG PO TABS: 5.0000 mg | ORAL_TABLET | Freq: Every day | ORAL | 3 refills | Status: DC

## 2017-12-09 MED FILL — FARXIGA 5 MG TABLET: 5 | 90 days supply | Qty: 90 | Fill #0

## 2017-12-09 NOTE — Progress Notes (Signed)
Subjective:    Patient ID: James Garcia, male    DOB: 1955-04-07, 62 y.o.   MRN: [id number]  HPI Pt returns for f/u of diabetes mellitus: DM type: Insulin-requiring type 2 Dx'ed: 1937 Complications: polyneuropathy and DR Therapy: insulin since 2016, and janumet.  DKA: never Severe hypoglycemia: never Pancreatitis: never Other: he declines multiple daily injections; he works 2nd shift. QD insulin was changed to levemir, due to pattern of cbg's.   Interval history: no cbg record, but states it varies from 100-280.  There is no trend throughout the day.  pt states he feels well in general, except for excessive appetite.  Pt says he never misses the insulin. Meter is downloaded today, and the printout is scanned into the record.  There are 2 cbg's (212 and 284).   Past Medical History:  Diagnosis Date  . Constipation    uses OTC laxatives - hard stools every morning- small amount and feels like doesnt empty   . Diabetes mellitus without complication ([place])   . DM type 2 (diabetes mellitus, type 2) ([place])   . Hyperlipidemia   . Hypertension   . Leg cramps   . 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 Tuberculosis    pt states was treated for TB    Past Surgical History:  Procedure Laterality Date  . arm surgery    . COLONOSCOPY    . ELBOW SURGERY    . FOOT SURGERY Bilateral   . POLYPECTOMY      Social History   Socioeconomic History  . Marital status: Single    Spouse name: Not on file  . Number of children: Not on file  . Years of education: Not on file  . Highest education level: Not on file  Occupational History  . Occupation: Engineer, maintenance (IT) at [employer]  . Financial resource strain: Not on file  . Food insecurity:    Worry: Not on file    Inability: Not on file  . Transportation needs:    Medical: Not on file    Non-medical: Not on file  Tobacco Use  . Smoking status: Former Smoker    Packs/day: 0.25    Years: 5.00    Pack years: 1.25    Types:  Cigarettes    Last attempt to quit: 06/04/1978    Years since quitting: 39.5  . Smokeless tobacco: Never Used  Substance and Sexual Activity  . Alcohol use: No  . Drug use: No  . Sexual activity: Not on file  Lifestyle  . Physical activity:    Days per week: Not on file    Minutes per session: Not on file  . Stress: Not on file  Relationships  . Social connections:    Talks on phone: Not on file    Gets together: Not on file    Attends religious service: Not on file    Active member of club or organization: Not on file    Attends meetings of clubs or organizations: Not on file    Relationship status: Not on file  . Intimate partner violence:    Fear of current or ex partner: Not on file    Emotionally abused: Not on file    Physically abused: Not on file    Forced sexual activity: Not on file  Other Topics Concern  . Not on file  Social History Narrative   ** Merged History Encounter **        Current  Outpatient Medications on File Prior to Visit  Medication Sig Dispense Refill  . amLODipine (NORVASC) 5 MG tablet TAKE 1 TABLET (5 MG TOTAL) BY MOUTH DAILY. 90 tablet 1  . atorvastatin (LIPITOR) 20 MG tablet Take 1 tablet (20 mg total) by mouth daily. 90 tablet 1  . Blood Pressure Monitor KIT Use as instructed for HTN 1 each 0  . gabapentin (NEURONTIN) 100 MG capsule TAKE 1 CAPSULE BY MOUTH 2 TIMES DAILY. 60 capsule 2  . glucose blood (FREESTYLE LITE) test strip USE TO CHECK BLOOD SUGAR 2 TIMES PER DAY. 200 each 2  . Insulin Detemir (LEVEMIR FLEXTOUCH) 100 UNIT/ML Pen Inject 110 Units into the skin every morning. And pen needles 2/day 15 pen 11  . JANUMET XR 50-1000 MG TB24 TAKE 2 TABLETS BY MOUTH EVERY MORNING. 60 tablet 11  . Lancets (FREESTYLE) lancets USE TO CHECK BLOOD SUGAR 2 TIMES PER DAY. 200 each 2  . losartan (COZAAR) 100 MG tablet Take 1 tablet (100 mg total) by mouth daily. 90 tablet 0  . omeprazole (PRILOSEC) 20 MG capsule Take 1 capsule (20 mg total) by mouth  every morning. 90 capsule 1   Current Facility-Administered Medications on File Prior to Visit  Medication Dose Route Frequency Provider Last Rate Last Dose  . 0.9 %  sodium chloride infusion  500 mL Intravenous Once Pyrtle, Lajuan Lines, MD        No Known Allergies  Family History  Problem Relation Age of Onset  . Diabetes Father   . Colon cancer Neg Hx   . Colon polyps Neg Hx   . Rectal cancer Neg Hx   . Stomach cancer Neg Hx     BP (!) 146/80   Pulse 78   Ht 6' (1.829 m)   Wt 267 lb 4.8 oz (121.2 kg)   SpO2 98%   BMI 36.25 kg/m    Review of Systems He denies hypoglycemia.  He has weight gain    Objective:   Physical Exam VITAL SIGNS:  See vs page GENERAL: no distress Pulses: dorsalis pedis intact bilat.   MSK: no deformity of the feet CV: 1+ bilat leg edema Skin:  no ulcer on the feet.  normal color and temp on the feet. Neuro: sensation is intact to touch on the feet Ext: There is bilateral onychomycosis of the toenails.    Lab Results  Component Value Date   CREATININE 0.81 07/12/2017   BUN 15 07/12/2017   NA 139 07/12/2017   K 4.0 07/12/2017   CL 100 07/12/2017   CO2 22 07/12/2017     Lab Results  Component Value Date   HGBA1C 7.9 (A) 12/09/2017       Assessment & Plan:  HTN: is noted today. Insulin-requiring type 2 DM, with DR Obesity: worse.  Pt requests this be considered in rx regimen.   Patient Instructions  Your blood pressure is high today.  Please see your primary care provider soon, to have it rechecked I have sent a prescription to your pharmacy, to add "farxiga." check your blood sugar twice a day.  vary the time of day when you check, between before the 3 meals, and at bedtime.  also check if you have symptoms of your blood sugar being too high or too low.  please keep a record of the readings and bring it to your next appointment here (or you can bring the meter itself).  You can write it on any piece of paper.  please  call us sooner if  your blood sugar goes below 70, or if you have a lot of readings over 200. Please come back for a follow-up appointment in 4 months.

## 2017-12-09 NOTE — Patient Instructions (Addendum)
Your blood pressure is high today.  Please see your primary care provider soon, to have it rechecked I have sent a prescription to your pharmacy, to add "farxiga." check your blood sugar twice a day.  vary the time of day when you check, between before the 3 meals, and at bedtime.  also check if you have symptoms of your blood sugar being too high or too low.  please keep a record of the readings and bring it to your next appointment here (or you can bring the meter itself).  You can write it on any piece of paper.  please call us sooner if your blood sugar goes below 70, or if you have a lot of readings over 200. Please come back for a follow-up appointment in 4 months.

## 2017-12-12 MED FILL — JANUMET XR 50-1,000 MG TAB: 50-1000 | 30 days supply | Qty: 60 | Fill #5

## 2017-12-12 MED FILL — GABAPENTIN 100 MG CAP: 100 | 30 days supply | Qty: 60 | Fill #1

## 2017-12-24 ENCOUNTER — Encounter: Payer: Self-pay | Admitting: Family Medicine

## 2017-12-24 ENCOUNTER — Ambulatory Visit (INDEPENDENT_AMBULATORY_CARE_PROVIDER_SITE_OTHER): Payer: 59

## 2017-12-24 ENCOUNTER — Ambulatory Visit (INDEPENDENT_AMBULATORY_CARE_PROVIDER_SITE_OTHER): Payer: 59 | Admitting: Family Medicine

## 2017-12-24 VITALS — BP 122/84 | HR 71 | Temp 97.6°F | Resp 18 | Ht 72.0 in | Wt 267.0 lb

## 2017-12-24 DIAGNOSIS — R059 Cough, unspecified: Secondary | ICD-10-CM

## 2017-12-24 DIAGNOSIS — R062 Wheezing: Secondary | ICD-10-CM

## 2017-12-24 DIAGNOSIS — R05 Cough: Secondary | ICD-10-CM

## 2017-12-24 DIAGNOSIS — E785 Hyperlipidemia, unspecified: Secondary | ICD-10-CM | POA: Diagnosis not present

## 2017-12-24 DIAGNOSIS — J40 Bronchitis, not specified as acute or chronic: Secondary | ICD-10-CM

## 2017-12-24 LAB — POCT CBC
GRANULOCYTE PERCENT: 66.2 % (ref 37–80)
HCT, POC: 46.9 % (ref 43.5–53.7)
HEMOGLOBIN: 15.9 g/dL (ref 14.1–18.1)
Lymph, poc: 1.6 (ref 0.6–3.4)
MCH: 30.3 pg (ref 27–31.2)
MCHC: 34 g/dL (ref 31.8–35.4)
MCV: 89 fL (ref 80–97)
MID (cbc): 0.6 (ref 0–0.9)
MPV: 7.8 fL (ref 0–99.8)
PLATELET COUNT, POC: 160 10*3/uL (ref 142–424)
POC Granulocyte: 4.2 (ref 2–6.9)
POC LYMPH PERCENT: 24.9 %L (ref 10–50)
POC MID %: 8.9 % (ref 0–12)
RBC: 5.26 M/uL (ref 4.69–6.13)
RDW, POC: 13.6 %
WBC: 6.4 10*3/uL (ref 4.6–10.2)

## 2017-12-24 MED ORDER — ALBUTEROL SULFATE (2.5 MG/3ML) 0.083% IN NEBU
2.5000 mg | INHALATION_SOLUTION | Freq: Once | RESPIRATORY_TRACT | Status: AC
  Administered 2017-12-24: 2.5 mg via RESPIRATORY_TRACT

## 2017-12-24 MED ORDER — IPRATROPIUM BROMIDE 0.02 % IN SOLN
0.5000 mg | Freq: Once | RESPIRATORY_TRACT | Status: AC
  Administered 2017-12-24: 0.5 mg via RESPIRATORY_TRACT

## 2017-12-24 MED ORDER — PREDNISONE 20 MG PO TABS: 40.0000 mg | ORAL_TABLET | Freq: Every day | ORAL | 0 refills | Status: DC

## 2017-12-24 MED ORDER — BENZONATATE 100 MG PO CAPS: 100.0000 mg | ORAL_CAPSULE | Freq: Three times a day (TID) | ORAL | 0 refills | Status: DC | PRN

## 2017-12-24 MED ORDER — ALBUTEROL SULFATE HFA 108 (90 BASE) MCG/ACT IN AERS: 1.0000 | INHALATION_SPRAY | RESPIRATORY_TRACT | 0 refills | Status: DC | PRN

## 2017-12-24 NOTE — Patient Instructions (Addendum)
Cough and congestion likely due to a virus, but he also you are wheezing or have reactive airway today.  Breathing treatments helped some, but start prednisone 2 pills/day for the next 5 days.  That will increase blood sugars, so contact your endocrinologist to discuss changes to diabetes regimen during this time. Albuterol 1 to 2 puffs every 4 hours as needed for wheezing, but I expect that to improve in the next 48 hours.  If you are not improving going into Monday or worsening sooner, be seen here or the emergency room. (if inhaler needed prior to 4 hours - be seen in emergency room).   Tessalon perles for cough if needed. mucinex may help bring up phlegm, but stop mucinex if causes increased wheeze.   Lightheadedness likely due to the wheezing as well.  Make sure you are drinking plenty of fluids, follow-up here or emergency room if any worsening lightheadedness.   Return to the clinic or go to the nearest emergency room if any of your symptoms worsen or new symptoms occur.  How to Use a Metered Dose Inhaler A metered dose inhaler is a handheld device for taking medicine that must be breathed into the lungs (inhaled). The device can be used to deliver a variety of inhaled medicines, including:  Quick relief or rescue medicines, such as bronchodilators.  Controller medicines, such as corticosteroids.  The medicine is delivered by pushing down on a metal canister to release a preset amount of spray and medicine. Each device contains the amount of medicine that is needed for a preset number of uses (inhalations). Your health care provider may recommend that you use a spacer with your inhaler to help you take the medicine more effectively. A spacer is a plastic tube with a mouthpiece on one end and an opening that connects to the inhaler on the other end. A spacer holds the medicine in a tube for a short time, which allows you to inhale more medicine. What are the risks? If you do not use your  inhaler correctly, medicine might not reach your lungs to help you breathe. Inhaler medicine can cause side effects, such as:  Mouth or throat infection.  Cough.  Hoarseness.  Headache.  Nausea and vomiting.  Lung infection (pneumonia) in people who have a lung condition called COPD.  How to use a metered dose inhaler without a spacer 1. Remove the cap from the inhaler. 2. If you are using the inhaler for the first time, shake it for 5 seconds, turn it away from your face, then release 4 puffs into the air. This is called priming. 3. Shake the inhaler for 5 seconds. 4. Position the inhaler so the top of the canister faces up. 5. Put your index finger on the top of the medicine canister. Support the bottom of the inhaler with your thumb. 6. Breathe out normally and as completely as possible, away from the inhaler. 7. Either place the inhaler between your teeth and close your lips tightly around the mouthpiece, or hold the inhaler 1-2 inches (2.5-5 cm) away from your open mouth. Keep your tongue down out of the way. If you are unsure which technique to use, ask your health care provider. 8. Press the canister down with your index finger to release the medicine, then inhale deeply and slowly through your mouth (not your nose) until your lungs are completely filled. Inhaling should take 4-6 seconds. 9. Hold the medicine in your lungs for 5-10 seconds (10 seconds is best).  This helps the medicine get into the small airways of your lungs. 10. With your lips in a tight circle (pursed), breathe out slowly. 11. Repeat steps 3-10 until you have taken the number of puffs that your health care provider directed. Wait about 1 minute between puffs or as directed. 12. Put the cap on the inhaler. 13. If you are using a steroid inhaler, rinse your mouth with water, gargle, and spit out the water. Do not swallow the water. How to use a metered dose inhaler with a spacer 1. Remove the cap from the  inhaler. 2. If you are using the inhaler for the first time, shake it for 5 seconds, turn it away from your face, then release 4 puffs into the air. This is called priming. 3. Shake the inhaler for 5 seconds. 4. Place the open end of the spacer onto the inhaler mouthpiece. 5. Position the inhaler so the top of the canister faces up and the spacer mouthpiece faces you. 6. Put your index finger on the top of the medicine canister. Support the bottom of the inhaler and the spacer with your thumb. 7. Breathe out normally and as completely as possible, away from the spacer. 8. Place the spacer between your teeth and close your lips tightly around it. Keep your tongue down out of the way. 9. Press the canister down with your index finger to release the medicine, then inhale deeply and slowly through your mouth (not your nose) until your lungs are completely filled. Inhaling should take 4-6 seconds. 10. Hold the medicine in your lungs for 5-10 seconds (10 seconds is best). This helps the medicine get into the small airways of your lungs. 11. With your lips in a tight circle (pursed), breathe out slowly. 12. Repeat steps 3-11 until you have taken the number of puffs that your health care provider directed. Wait about 1 minute between puffs or as directed. 13. Remove the spacer from the inhaler and put the cap on the inhaler. 14. If you are using a steroid inhaler, rinse your mouth with water, gargle, and spit out the water. Do not swallow the water. Follow these instructions at home:  Take your inhaled medicine only as told by your health care provider. Do not use the inhaler more than directed by your health care provider.  Keep all follow-up visits as told by your health care provider. This is important.  If your inhaler has a counter, you can check it to determine how full your inhaler is. If your inhaler does not have a counter, ask your health care provider when you will need to refill your inhaler  and write the refill date on a calendar or on your inhaler canister. Note that you cannot know when an inhaler is empty by shaking it.  Follow directions on the package insert for care and cleaning of your inhaler and spacer. Contact a health care provider if:  Symptoms are only partially relieved with your inhaler.  You are having trouble using your inhaler.  You have an increase in phlegm.  You have headaches. Get help right away if:  You feel little or no relief after using your inhaler.  You have dizziness.  You have a fast heart rate.  You have chills or a fever.  You have night sweats.  There is blood in your phlegm. Summary  A metered dose inhaler is a handheld device for taking medicine that must be breathed into the lungs (inhaled).  The medicine is  delivered by pushing down on a metal canister to release a preset amount of spray and medicine.  Each device contains the amount of medicine that is needed for a preset number of uses (inhalations). This information is not intended to replace advice given to you by your health care provider. Make sure you discuss any questions you have with your health care provider. Document Released: 03/01/2005 Document Revised: 01/20/2016 Document Reviewed: 01/20/2016 Elsevier Interactive Patient Education  2017 [place].   Upper Respiratory Infection, Adult Most upper respiratory infections (URIs) are caused by a virus. A URI affects the nose, throat, and upper air passages. The most common type of URI is often called "the common cold." Follow these instructions at home:  Take medicines only as told by your doctor.  Gargle warm saltwater or take cough drops to comfort your throat as told by your doctor.  Use a warm mist humidifier or inhale steam from a shower to increase air moisture. This may make it easier to breathe.  Drink enough fluid to keep your pee (urine) clear or pale yellow.  Eat soups and other clear  broths.  Have a healthy diet.  Rest as needed.  Go back to work when your fever is gone or your doctor says it is okay. ? You may need to stay home longer to avoid giving your URI to others. ? You can also wear a face mask and wash your hands often to prevent spread of the virus.  Use your inhaler more if you have asthma.  Do not use any tobacco products, including cigarettes, chewing tobacco, or electronic cigarettes. If you need help quitting, ask your doctor. Contact a doctor if:  You are getting worse, not better.  Your symptoms are not helped by medicine.  You have chills.  You are getting more short of breath.  You have brown or red mucus.  You have yellow or brown discharge from your nose.  You have pain in your face, especially when you bend forward.  You have a fever.  You have puffy (swollen) neck glands.  You have pain while swallowing.  You have white areas in the back of your throat. Get help right away if:  You have very bad or constant: ? Headache. ? Ear pain. ? Pain in your forehead, behind your eyes, and over your cheekbones (sinus pain). ? Chest pain.  You have long-lasting (chronic) lung disease and any of the following: ? Wheezing. ? Long-lasting cough. ? Coughing up blood. ? A change in your usual mucus.  You have a stiff neck.  You have changes in your: ? Vision. ? Hearing. ? Thinking. ? Mood. This information is not intended to replace advice given to you by your health care provider. Make sure you discuss any questions you have with your health care provider. Document Released: 08/18/2007 Document Revised: 11/02/2015 Document Reviewed: 06/06/2013 Elsevier Interactive Patient Education  2018 [employer].  Bronchospasm, Adult Bronchospasm is a tightening of the airways going into the lungs. During an episode, it may be harder to breathe. You may cough, and you may make a whistling sound when you breathe (wheeze). This condition  often affects people with asthma. What are the causes? This condition is caused by swelling and irritation in the airways. It can be triggered by:  An infection (common).  Seasonal allergies.  An allergic reaction.  Exercise.  Irritants. These include pollution, cigarette smoke, strong odors, aerosol sprays, and paint fumes.  Weather changes. Winds increase molds  and pollens in the air. Cold air may cause swelling.  Stress and emotional upset.  What are the signs or symptoms? Symptoms of this condition include:  Wheezing. If the episode was triggered by an allergy, wheezing may start right away or hours later.  Nighttime coughing.  Frequent or severe coughing with a simple cold.  Chest tightness.  Shortness of breath.  Decreased ability to exercise.  How is this diagnosed? This condition is usually diagnosed with a review of your medical history and a physical exam. Tests, such as lung function tests, are sometimes done to look for other conditions. The need for a chest X-ray depends on where the wheezing occurs and whether it is the first time you have wheezed. How is this treated? This condition may be treated with:  Inhaled medicines. These open up the airways and help you breathe. They can be taken with an inhaler or a nebulizer device.  Corticosteroid medicines. These may be given for severe bronchospasm, usually when it is associated with asthma.  Avoiding triggers, such as irritants, infection, or allergies.  Follow these instructions at home: Medicines  Take over-the-counter and prescription medicines only as told by your health care provider.  If you need to use an inhaler or nebulizer to take your medicine, ask your health care provider to explain how to use it correctly. If you were given a spacer, always use it with your inhaler. Lifestyle  Reduce the number of triggers in your home. To do this: ? Change your heating and air conditioning filter at  least once a month. ? Limit your use of fireplaces and wood stoves. ? Do not smoke. Do not allow smoking in your home. ? Avoid using perfumes and fragrances. ? Get rid of pests, such as roaches and mice, and their droppings. ? Remove any mold from your home. ? Keep your house clean and dust free. Use unscented cleaning products. ? Replace carpet with wood, tile, or vinyl flooring. Carpet can trap dander and dust. ? Use allergy-proof pillows, mattress covers, and box spring covers. ? Wash bed sheets and blankets every week in hot water. Dry them in a dryer. ? Use blankets that are made of polyester or cotton. ? Wash your hands often. ? Do not allow pets in your bedroom.  Avoid breathing in cold air when you exercise. General instructions  Have a plan for seeking medical care. Know when to call your health care provider and local emergency services, and where to get emergency care.  Stay up to date on your immunizations.  When you have an episode of bronchospasm, stay calm. Try to relax and breathe more slowly.  If you have asthma, make sure you have an asthma action plan.  Keep all follow-up visits as told by your health care provider. This is important. Contact a health care provider if:  You have muscle aches.  You have chest pain.  The mucus that you cough up (sputum) changes from clear or white to yellow, green, gray, or bloody.  You have a fever.  Your sputum gets thicker. Get help right away if:  Your wheezing and coughing get worse, even after you take your prescribed medicines.  It gets even harder to breathe.  You develop severe chest pain. Summary  Bronchospasm is a tightening of the airways going into the lungs.  During an episode of bronchospasm, you may have a harder time breathing. You may cough and make a whistling sound when you breathe (wheeze).  Avoid  exposure to triggers such as smoke, dust, mold, animal dander, and fragrances.  When you have an  episode of bronchospasm, stay calm. Try to relax and breathe more slowly. This information is not intended to replace advice given to you by your health care provider. Make sure you discuss any questions you have with your health care provider. Document Released: 03/04/2003 Document Revised: 02/26/2016 Document Reviewed: 02/26/2016 Elsevier Interactive Patient Education  [employer].   If you have lab work done today you will be contacted with your lab results within the next 2 weeks.  If you have not heard from [place] then please contact us. The fastest way to get your results is to register for My Chart.   IF you received an x-ray today, you will receive an invoice from [facility]. Please contact [facility] at [phone] with questions or concerns regarding your invoice.   IF you received labwork today, you will receive an invoice from [place]. Please contact [facility] at [phone] with questions or concerns regarding your invoice.   Our billing staff will not be able to assist you with questions regarding bills from these companies.  You will be contacted with the lab results as soon as they are available. The fastest way to get your results is to activate your My Chart account. Instructions are located on the last page of this paperwork. If you have not heard from [place] regarding the results in 2 weeks, please contact this office.

## 2017-12-24 NOTE — Progress Notes (Signed)
Subjective:    Patient ID: CAS TRACZ, male    DOB: 09/19/1955, 62 y.o.   MRN: [id number]  HPI James Garcia is a 62 y.o. male Presents today for: Chief Complaint  Patient presents with  . recheck labs    recheck his cholesterol levels since being on Lipitor  . Cough    x1 week; nonproductive cough; states its in his chest  . light-headed    states since having cough/cold he has been feeling a little dizzy   Hyperlipidemia: Lipitor increased to 20 mg at August third visit.  History of diabetes, goal LDL less than 70. No new side effects.  Lab Results  Component Value Date   CHOL 154 07/12/2017   HDL 48 07/12/2017   LDLCALC 91 07/12/2017   TRIG 76 07/12/2017   CHOLHDL 3.2 07/12/2017   Lab Results  Component Value Date   ALT 24 07/12/2017   AST 20 07/12/2017   ALKPHOS 78 07/12/2017   BILITOT 0.7 07/12/2017    Cough: Present for past 6 days, associated with congestion, runny nose.  Dry cough. No fever. Wheezing at times. Hx of bronchitis, no known asthma but took inhaler years ago. Face and chest congestion worse at night.  Tx: cough syrup otc - sugar free.  No known sick contacts, no recent travel.  Feels generalized fatigue/weak, myalgias.  Episodic lightheaded, dizzy with bending over. Hx of IDDM, home readings low of 99-100, no symptomatic lows recently.  High of 250 when sick.  Variable in 100's for the most part.     Patient Active Problem List   Diagnosis Date Noted  . Screening for prostate cancer 12/24/2016  . Diabetes ([place]) 05/01/2015  . Thrombocytopenia ([place]) 06/04/2014  . TB (tuberculosis), treated 03/31/2013  . Cough 03/30/2013   Past Medical History:  Diagnosis Date  . Constipation    uses OTC laxatives - hard stools every morning- small amount and feels like doesnt empty   . Diabetes mellitus without complication ([place])   . DM type 2 (diabetes mellitus, type 2) ([place])   . Hyperlipidemia   . Hypertension   . Leg cramps   . Tuberculin skin test  (TST) positive   . Tuberculosis    pt states was treated for TB   Past Surgical History:  Procedure Laterality Date  . arm surgery    . COLONOSCOPY    . ELBOW SURGERY    . FOOT SURGERY Bilateral   . POLYPECTOMY     No Known Allergies Prior to Admission medications   Medication Sig Start Date End Date Taking? Authorizing Provider  amLODipine (NORVASC) 5 MG tablet TAKE 1 TABLET (5 MG TOTAL) BY MOUTH DAILY. 11/10/17  Yes Wendie Agreste, MD  atorvastatin (LIPITOR) 20 MG tablet Take 1 tablet (20 mg total) by mouth daily. 10/15/17  Yes Wendie Agreste, MD  Blood Pressure Monitor KIT Use as instructed for HTN 07/12/17  Yes Wendie Agreste, MD  dapagliflozin propanediol (FARXIGA) 5 MG TABS tablet Take 5 mg by mouth daily. 12/09/17  Yes Renato Shin, MD  gabapentin (NEURONTIN) 100 MG capsule TAKE 1 CAPSULE BY MOUTH 2 TIMES DAILY. 11/10/17  Yes Wendie Agreste, MD  glucose blood (FREESTYLE LITE) test strip USE TO CHECK BLOOD SUGAR 2 TIMES PER DAY. 11/02/17  Yes Renato Shin, MD  Insulin Detemir (LEVEMIR FLEXTOUCH) 100 UNIT/ML Pen Inject 110 Units into the skin every morning. And pen needles 2/day 10/04/17  Yes Renato Shin, MD  JANUMET XR  50-1000 MG TB24 TAKE 2 TABLETS BY MOUTH EVERY MORNING. 07/15/17  Yes Renato Shin, MD  Lancets (FREESTYLE) lancets USE TO CHECK BLOOD SUGAR 2 TIMES PER DAY. 11/02/17  Yes Renato Shin, MD  losartan (COZAAR) 100 MG tablet Take 1 tablet (100 mg total) by mouth daily. 11/30/17  Yes Wendie Agreste, MD  omeprazole (PRILOSEC) 20 MG capsule Take 1 capsule (20 mg total) by mouth every morning. 07/20/17  Yes Wendie Agreste, MD   Social History   Socioeconomic History  . Marital status: Single    Spouse name: Not on file  . Number of children: Not on file  . Years of education: Not on file  . Highest education level: Not on file  Occupational History  . Occupation: Engineer, maintenance (IT) at [employer]  . Financial resource strain: Not on file  . Food  insecurity:    Worry: Not on file    Inability: Not on file  . Transportation needs:    Medical: Not on file    Non-medical: Not on file  Tobacco Use  . Smoking status: Former Smoker    Packs/day: 0.25    Years: 5.00    Pack years: 1.25    Types: Cigarettes    Last attempt to quit: 06/04/1978    Years since quitting: 39.5  . Smokeless tobacco: Never Used  Substance and Sexual Activity  . Alcohol use: No  . Drug use: No  . Sexual activity: Not on file  Lifestyle  . Physical activity:    Days per week: Not on file    Minutes per session: Not on file  . Stress: Not on file  Relationships  . Social connections:    Talks on phone: Not on file    Gets together: Not on file    Attends religious service: Not on file    Active member of club or organization: Not on file    Attends meetings of clubs or organizations: Not on file    Relationship status: Not on file  . Intimate partner violence:    Fear of current or ex partner: Not on file    Emotionally abused: Not on file    Physically abused: Not on file    Forced sexual activity: Not on file  Other Topics Concern  . Not on file  Social History Narrative   ** Merged History Encounter **        Review of Systems  Constitutional: Negative for fever.  HENT: Positive for congestion, rhinorrhea and sneezing.   Respiratory: Positive for cough and wheezing. Negative for shortness of breath.   Neurological: Positive for dizziness (with leaning forward. ) and light-headedness (as above. ).       Objective:   Physical Exam  Constitutional: He is oriented to person, place, and time. He appears well-developed and well-nourished.  HENT:  Head: Normocephalic and atraumatic.  Right Ear: Tympanic membrane, external ear and ear canal normal.  Left Ear: Tympanic membrane, external ear and ear canal normal.  Nose: No rhinorrhea.  Mouth/Throat: Oropharynx is clear and moist and mucous membranes are normal. No oropharyngeal exudate or  posterior oropharyngeal erythema.  Min pressure sensation in sinuses.   Eyes: Pupils are equal, round, and reactive to light. Conjunctivae are normal.  Neck: Neck supple.  Cardiovascular: Normal rate, regular rhythm, normal heart sounds and intact distal pulses.  No murmur heard. Pulmonary/Chest: Effort normal. He has wheezes (diffuse). He has no rhonchi. He has no rales.  Abdominal: Soft. There is no tenderness.  Lymphadenopathy:    He has no cervical adenopathy.  Neurological: He is alert and oriented to person, place, and time.  Skin: Skin is warm and dry. No rash noted.  Psychiatric: He has a normal mood and affect. His behavior is normal.  Vitals reviewed.  Vitals:   12/24/17 0810 12/24/17 0835  BP: (!) 159/88 122/84  Pulse: 71   Resp: 18   Temp: 97.6 F (36.4 C)   TempSrc: Oral   SpO2: 96%   Weight: 267 lb (121.1 kg)   Height: 6' (1.829 m)    9:02 AM Albuterol 2.105m neb given: still with persistent wheeze, improved aeration. Feels a little better.   9:30 AM Repeat Albuterol and atrovent neb given  9:53 AM: Feels minimally better after second albuterol and dose of Atrovent, but still diffuse coarse breath sounds, expiratory wheeze.  Plan for third albuterol, second Atrovent  10:52 AM Status post second Atrovent, third albuterol neb.  Improved aeration, still some expiratory wheeze but less coughing.  Feels better.    Results for orders placed or performed in visit on 12/24/17  POCT CBC  Result Value Ref Range   WBC 6.4 4.6 - 10.2 K/uL   Lymph, poc 1.6 0.6 - 3.4   POC LYMPH PERCENT 24.9 10 - 50 %L   MID (cbc) 0.6 0 - 0.9   POC MID % 8.9 0 - 12 %M   POC Granulocyte 4.2 2 - 6.9   Granulocyte percent 66.2 37 - 80 %G   RBC 5.26 4.69 - 6.13 M/uL   Hemoglobin 15.9 14.1 - 18.1 g/dL   HCT, POC 46.9 43.5 - 53.7 %   MCV 89.0 80 - 97 fL   MCH, POC 30.3 27 - 31.2 pg   MCHC 34.0 31.8 - 35.4 g/dL   RDW, POC 13.6 %   Platelet Count, POC 160 142 - 424 K/uL   MPV 7.8 0 -  99.8 fL   Dg Chest 2 View  Result Date: 12/24/2017 CLINICAL DATA:  Cough for several days EXAM: CHEST - 2 VIEW COMPARISON:  03/30/2013 FINDINGS: The heart size and mediastinal contours are within normal limits. Both lungs are clear. The visualized skeletal structures are unremarkable. IMPRESSION: No active cardiopulmonary disease. Electronically Signed   By: MInez CatalinaM.D.   On: 12/24/2017 10:22   Over 40 minutes face to face care with multiple repeat exams.      Assessment & Plan:    PMARUICE PIERONIis a 62y.o. male Cough - Plan: albuterol (PROVENTIL) (2.5 MG/3ML) 0.083% nebulizer solution 2.5 mg, POCT CBC, DG Chest 2 View, predniSONE (DELTASONE) 20 MG tablet, benzonatate (TESSALON) 100 MG capsule Wheezing - Plan: albuterol (PROVENTIL) (2.5 MG/3ML) 0.083% nebulizer solution 2.5 mg, albuterol (PROVENTIL) (2.5 MG/3ML) 0.083% nebulizer solution 2.5 mg, ipratropium (ATROVENT) nebulizer solution 0.5 mg, albuterol (PROVENTIL) (2.5 MG/3ML) 0.083% nebulizer solution 2.5 mg, ipratropium (ATROVENT) nebulizer solution 0.5 mg, predniSONE (DELTASONE) 20 MG tablet, albuterol (PROVENTIL HFA;VENTOLIN HFA) 108 (90 Base) MCG/ACT inhaler Bronchitis - Plan: predniSONE (DELTASONE) 20 MG tablet  -Likely viral upper respiratory infection with secondary bronchitis with bronchospasm.  Did have some improvement after 3 albuterol nebs, 2 Atrovent nebs.  Chest x-ray without sign of infection, afebrile, reassuring CBC.  -Prednisone 40 mg daily x5 days, potential side effects and risk discussed including hyperglycemia with his diabetes.  Advised to contact his endocrinologist to discuss dosing changes if needed  -Albuterol HFA, every 4 hours as needed, ER  precautions if needed sooner or persistent use.  RTC precautions.   -Lightheadedness likely due to bronchospasm.  Vitals reassuring, improved during office visit.  -Tessalon Perles as needed for cough, Mucinex if needed but warned of increased wheezing to stop  Mucinex.  Hyperlipidemia, unspecified hyperlipidemia type - Plan: Lipid panel, Comprehensive metabolic panel, Lipid panel  -Tolerating higher dose of Lipitor, repeat lipid panel.  Meds ordered this encounter  Medications  . albuterol (PROVENTIL) (2.5 MG/3ML) 0.083% nebulizer solution 2.5 mg  . albuterol (PROVENTIL) (2.5 MG/3ML) 0.083% nebulizer solution 2.5 mg  . ipratropium (ATROVENT) nebulizer solution 0.5 mg  . albuterol (PROVENTIL) (2.5 MG/3ML) 0.083% nebulizer solution 2.5 mg  . ipratropium (ATROVENT) nebulizer solution 0.5 mg  . predniSONE (DELTASONE) 20 MG tablet    Sig: Take 2 tablets (40 mg total) by mouth daily with breakfast.    Dispense:  10 tablet    Refill:  0  . albuterol (PROVENTIL HFA;VENTOLIN HFA) 108 (90 Base) MCG/ACT inhaler    Sig: Inhale 1-2 puffs into the lungs every 4 (four) hours as needed for wheezing or shortness of breath.    Dispense:  1 Inhaler    Refill:  0  . benzonatate (TESSALON) 100 MG capsule    Sig: Take 1 capsule (100 mg total) by mouth 3 (three) times daily as needed for cough.    Dispense:  20 capsule    Refill:  0   Patient Instructions   Cough and congestion likely due to a virus, but he also you are wheezing or have reactive airway today.  Breathing treatments helped some, but start prednisone 2 pills/day for the next 5 days.  That will increase blood sugars, so contact your endocrinologist to discuss changes to diabetes regimen during this time. Albuterol 1 to 2 puffs every 4 hours as needed for wheezing, but I expect that to improve in the next 48 hours.  If you are not improving going into Monday or worsening sooner, be seen here or the emergency room. (if inhaler needed prior to 4 hours - be seen in emergency room).   Tessalon perles for cough if needed. mucinex may help bring up phlegm, but stop mucinex if causes increased wheeze.   Lightheadedness likely due to the wheezing as well.  Make sure you are drinking plenty of fluids,  follow-up here or emergency room if any worsening lightheadedness.   Return to the clinic or go to the nearest emergency room if any of your symptoms worsen or new symptoms occur.  How to Use a Metered Dose Inhaler A metered dose inhaler is a handheld device for taking medicine that must be breathed into the lungs (inhaled). The device can be used to deliver a variety of inhaled medicines, including:  Quick relief or rescue medicines, such as bronchodilators.  Controller medicines, such as corticosteroids.  The medicine is delivered by pushing down on a metal canister to release a preset amount of spray and medicine. Each device contains the amount of medicine that is needed for a preset number of uses (inhalations). Your health care provider may recommend that you use a spacer with your inhaler to help you take the medicine more effectively. A spacer is a plastic tube with a mouthpiece on one end and an opening that connects to the inhaler on the other end. A spacer holds the medicine in a tube for a short time, which allows you to inhale more medicine. What are the risks? If you do not use your inhaler correctly,  medicine might not reach your lungs to help you breathe. Inhaler medicine can cause side effects, such as:  Mouth or throat infection.  Cough.  Hoarseness.  Headache.  Nausea and vomiting.  Lung infection (pneumonia) in people who have a lung condition called COPD.  How to use a metered dose inhaler without a spacer 1. Remove the cap from the inhaler. 2. If you are using the inhaler for the first time, shake it for 5 seconds, turn it away from your face, then release 4 puffs into the air. This is called priming. 3. Shake the inhaler for 5 seconds. 4. Position the inhaler so the top of the canister faces up. 5. Put your index finger on the top of the medicine canister. Support the bottom of the inhaler with your thumb. 6. Breathe out normally and as completely as  possible, away from the inhaler. 7. Either place the inhaler between your teeth and close your lips tightly around the mouthpiece, or hold the inhaler 1-2 inches (2.5-5 cm) away from your open mouth. Keep your tongue down out of the way. If you are unsure which technique to use, ask your health care provider. 8. Press the canister down with your index finger to release the medicine, then inhale deeply and slowly through your mouth (not your nose) until your lungs are completely filled. Inhaling should take 4-6 seconds. 9. Hold the medicine in your lungs for 5-10 seconds (10 seconds is best). This helps the medicine get into the small airways of your lungs. 10. With your lips in a tight circle (pursed), breathe out slowly. 11. Repeat steps 3-10 until you have taken the number of puffs that your health care provider directed. Wait about 1 minute between puffs or as directed. 12. Put the cap on the inhaler. 13. If you are using a steroid inhaler, rinse your mouth with water, gargle, and spit out the water. Do not swallow the water. How to use a metered dose inhaler with a spacer 1. Remove the cap from the inhaler. 2. If you are using the inhaler for the first time, shake it for 5 seconds, turn it away from your face, then release 4 puffs into the air. This is called priming. 3. Shake the inhaler for 5 seconds. 4. Place the open end of the spacer onto the inhaler mouthpiece. 5. Position the inhaler so the top of the canister faces up and the spacer mouthpiece faces you. 6. Put your index finger on the top of the medicine canister. Support the bottom of the inhaler and the spacer with your thumb. 7. Breathe out normally and as completely as possible, away from the spacer. 8. Place the spacer between your teeth and close your lips tightly around it. Keep your tongue down out of the way. 9. Press the canister down with your index finger to release the medicine, then inhale deeply and slowly through your  mouth (not your nose) until your lungs are completely filled. Inhaling should take 4-6 seconds. 10. Hold the medicine in your lungs for 5-10 seconds (10 seconds is best). This helps the medicine get into the small airways of your lungs. 11. With your lips in a tight circle (pursed), breathe out slowly. 12. Repeat steps 3-11 until you have taken the number of puffs that your health care provider directed. Wait about 1 minute between puffs or as directed. 13. Remove the spacer from the inhaler and put the cap on the inhaler. 14. If you are using a steroid  inhaler, rinse your mouth with water, gargle, and spit out the water. Do not swallow the water. Follow these instructions at home:  Take your inhaled medicine only as told by your health care provider. Do not use the inhaler more than directed by your health care provider.  Keep all follow-up visits as told by your health care provider. This is important.  If your inhaler has a counter, you can check it to determine how full your inhaler is. If your inhaler does not have a counter, ask your health care provider when you will need to refill your inhaler and write the refill date on a calendar or on your inhaler canister. Note that you cannot know when an inhaler is empty by shaking it.  Follow directions on the package insert for care and cleaning of your inhaler and spacer. Contact a health care provider if:  Symptoms are only partially relieved with your inhaler.  You are having trouble using your inhaler.  You have an increase in phlegm.  You have headaches. Get help right away if:  You feel little or no relief after using your inhaler.  You have dizziness.  You have a fast heart rate.  You have chills or a fever.  You have night sweats.  There is blood in your phlegm. Summary  A metered dose inhaler is a handheld device for taking medicine that must be breathed into the lungs (inhaled).  The medicine is delivered by pushing  down on a metal canister to release a preset amount of spray and medicine.  Each device contains the amount of medicine that is needed for a preset number of uses (inhalations). This information is not intended to replace advice given to you by your health care provider. Make sure you discuss any questions you have with your health care provider. Document Released: 03/01/2005 Document Revised: 01/20/2016 Document Reviewed: 01/20/2016 Elsevier Interactive Patient Education  2017 [place].   Upper Respiratory Infection, Adult Most upper respiratory infections (URIs) are caused by a virus. A URI affects the nose, throat, and upper air passages. The most common type of URI is often called "the common cold." Follow these instructions at home:  Take medicines only as told by your doctor.  Gargle warm saltwater or take cough drops to comfort your throat as told by your doctor.  Use a warm mist humidifier or inhale steam from a shower to increase air moisture. This may make it easier to breathe.  Drink enough fluid to keep your pee (urine) clear or pale yellow.  Eat soups and other clear broths.  Have a healthy diet.  Rest as needed.  Go back to work when your fever is gone or your doctor says it is okay. ? You may need to stay home longer to avoid giving your URI to others. ? You can also wear a face mask and wash your hands often to prevent spread of the virus.  Use your inhaler more if you have asthma.  Do not use any tobacco products, including cigarettes, chewing tobacco, or electronic cigarettes. If you need help quitting, ask your doctor. Contact a doctor if:  You are getting worse, not better.  Your symptoms are not helped by medicine.  You have chills.  You are getting more short of breath.  You have brown or red mucus.  You have yellow or brown discharge from your nose.  You have pain in your face, especially when you bend forward.  You have a fever.  You  have puffy (swollen) neck glands.  You have pain while swallowing.  You have white areas in the back of your throat. Get help right away if:  You have very bad or constant: ? Headache. ? Ear pain. ? Pain in your forehead, behind your eyes, and over your cheekbones (sinus pain). ? Chest pain.  You have long-lasting (chronic) lung disease and any of the following: ? Wheezing. ? Long-lasting cough. ? Coughing up blood. ? A change in your usual mucus.  You have a stiff neck.  You have changes in your: ? Vision. ? Hearing. ? Thinking. ? Mood. This information is not intended to replace advice given to you by your health care provider. Make sure you discuss any questions you have with your health care provider. Document Released: 08/18/2007 Document Revised: 11/02/2015 Document Reviewed: 06/06/2013 Elsevier Interactive Patient Education  2018 [employer].  Bronchospasm, Adult Bronchospasm is a tightening of the airways going into the lungs. During an episode, it may be harder to breathe. You may cough, and you may make a whistling sound when you breathe (wheeze). This condition often affects people with asthma. What are the causes? This condition is caused by swelling and irritation in the airways. It can be triggered by:  An infection (common).  Seasonal allergies.  An allergic reaction.  Exercise.  Irritants. These include pollution, cigarette smoke, strong odors, aerosol sprays, and paint fumes.  Weather changes. Winds increase molds and pollens in the air. Cold air may cause swelling.  Stress and emotional upset.  What are the signs or symptoms? Symptoms of this condition include:  Wheezing. If the episode was triggered by an allergy, wheezing may start right away or hours later.  Nighttime coughing.  Frequent or severe coughing with a simple cold.  Chest tightness.  Shortness of breath.  Decreased ability to exercise.  How is this diagnosed? This  condition is usually diagnosed with a review of your medical history and a physical exam. Tests, such as lung function tests, are sometimes done to look for other conditions. The need for a chest X-ray depends on where the wheezing occurs and whether it is the first time you have wheezed. How is this treated? This condition may be treated with:  Inhaled medicines. These open up the airways and help you breathe. They can be taken with an inhaler or a nebulizer device.  Corticosteroid medicines. These may be given for severe bronchospasm, usually when it is associated with asthma.  Avoiding triggers, such as irritants, infection, or allergies.  Follow these instructions at home: Medicines  Take over-the-counter and prescription medicines only as told by your health care provider.  If you need to use an inhaler or nebulizer to take your medicine, ask your health care provider to explain how to use it correctly. If you were given a spacer, always use it with your inhaler. Lifestyle  Reduce the number of triggers in your home. To do this: ? Change your heating and air conditioning filter at least once a month. ? Limit your use of fireplaces and wood stoves. ? Do not smoke. Do not allow smoking in your home. ? Avoid using perfumes and fragrances. ? Get rid of pests, such as roaches and mice, and their droppings. ? Remove any mold from your home. ? Keep your house clean and dust free. Use unscented cleaning products. ? Replace carpet with wood, tile, or vinyl flooring. Carpet can trap dander and dust. ? Use allergy-proof pillows, mattress covers, and  box spring covers. ? Wash bed sheets and blankets every week in hot water. Dry them in a dryer. ? Use blankets that are made of polyester or cotton. ? Wash your hands often. ? Do not allow pets in your bedroom.  Avoid breathing in cold air when you exercise. General instructions  Have a plan for seeking medical care. Know when to call your  health care provider and local emergency services, and where to get emergency care.  Stay up to date on your immunizations.  When you have an episode of bronchospasm, stay calm. Try to relax and breathe more slowly.  If you have asthma, make sure you have an asthma action plan.  Keep all follow-up visits as told by your health care provider. This is important. Contact a health care provider if:  You have muscle aches.  You have chest pain.  The mucus that you cough up (sputum) changes from clear or white to yellow, green, gray, or bloody.  You have a fever.  Your sputum gets thicker. Get help right away if:  Your wheezing and coughing get worse, even after you take your prescribed medicines.  It gets even harder to breathe.  You develop severe chest pain. Summary  Bronchospasm is a tightening of the airways going into the lungs.  During an episode of bronchospasm, you may have a harder time breathing. You may cough and make a whistling sound when you breathe (wheeze).  Avoid exposure to triggers such as smoke, dust, mold, animal dander, and fragrances.  When you have an episode of bronchospasm, stay calm. Try to relax and breathe more slowly. This information is not intended to replace advice given to you by your health care provider. Make sure you discuss any questions you have with your health care provider. Document Released: 03/04/2003 Document Revised: 02/26/2016 Document Reviewed: 02/26/2016 Elsevier Interactive Patient Education  [employer].   If you have lab work done today you will be contacted with your lab results within the next 2 weeks.  If you have not heard from [place] then please contact us. The fastest way to get your results is to register for My Chart.   IF you received an x-ray today, you will receive an invoice from [facility]. Please contact [facility] at [phone] with questions or concerns regarding your invoice.   IF  you received labwork today, you will receive an invoice from [place]. Please contact [facility] at [phone] with questions or concerns regarding your invoice.   Our billing staff will not be able to assist you with questions regarding bills from these companies.  You will be contacted with the lab results as soon as they are available. The fastest way to get your results is to activate your My Chart account. Instructions are located on the last page of this paperwork. If you have not heard from [place] regarding the results in 2 weeks, please contact this office.       Signed,   Merri Ray, MD [facility] [place].  12/24/17 10:53 AM

## 2017-12-25 LAB — COMPREHENSIVE METABOLIC PANEL
ALBUMIN: 4.3 g/dL (ref 3.6–4.8)
ALT: 22 IU/L (ref 0–44)
AST: 13 IU/L (ref 0–40)
Albumin/Globulin Ratio: 1.7 (ref 1.2–2.2)
Alkaline Phosphatase: 103 IU/L (ref 39–117)
BILIRUBIN TOTAL: 0.4 mg/dL (ref 0.0–1.2)
BUN/Creatinine Ratio: 19 (ref 10–24)
BUN: 18 mg/dL (ref 8–27)
CALCIUM: 9 mg/dL (ref 8.6–10.2)
CHLORIDE: 101 mmol/L (ref 96–106)
CO2: 24 mmol/L (ref 20–29)
CREATININE: 0.96 mg/dL (ref 0.76–1.27)
GFR calc non Af Amer: 84 mL/min/{1.73_m2} (ref >59)
GFR, EST AFRICAN AMERICAN: 98 mL/min/{1.73_m2} (ref >59)
GLUCOSE: 152 mg/dL — AB (ref 65–99)
Globulin, Total: 2.5 g/dL (ref 1.5–4.5)
Potassium: 4.6 mmol/L (ref 3.5–5.2)
Sodium: 140 mmol/L (ref 134–144)
TOTAL PROTEIN: 6.8 g/dL (ref 6.0–8.5)

## 2017-12-25 LAB — LIPID PANEL
CHOL/HDL RATIO: 3.7 ratio (ref 0.0–5.0)
Cholesterol, Total: 143 mg/dL (ref 100–199)
HDL: 39 mg/dL — AB (ref >39)
LDL Calculated: 91 mg/dL (ref 0–99)
Triglycerides: 66 mg/dL (ref 0–149)
VLDL Cholesterol Cal: 13 mg/dL (ref 5–40)

## 2017-12-29 MED FILL — LEVEMIR FLEXTOUCH 100 UNITS: 100 | 28 days supply | Qty: 30 | Fill #3

## 2018-01-11 MED FILL — JANUMET XR 50-1,000 MG TAB: 50-1000 | 30 days supply | Qty: 60 | Fill #6

## 2018-01-11 MED FILL — GABAPENTIN 100 MG CAP: 100 | 30 days supply | Qty: 60 | Fill #2

## 2018-01-11 MED FILL — UNIFINE PENTIPS 8MM 31G: 31G X 8 MM | 50 days supply | Qty: 100 | Fill #2

## 2018-01-23 ENCOUNTER — Telehealth: Payer: Self-pay

## 2018-01-23 DIAGNOSIS — E119 Type 2 diabetes mellitus without complications: Secondary | ICD-10-CM | POA: Diagnosis not present

## 2018-01-23 DIAGNOSIS — H3581 Retinal edema: Secondary | ICD-10-CM | POA: Diagnosis not present

## 2018-01-23 DIAGNOSIS — H35033 Hypertensive retinopathy, bilateral: Secondary | ICD-10-CM | POA: Diagnosis not present

## 2018-01-23 DIAGNOSIS — H2513 Age-related nuclear cataract, bilateral: Secondary | ICD-10-CM | POA: Diagnosis not present

## 2018-01-23 NOTE — Telephone Encounter (Signed)
Pt calling to get lab results from 12/2017 as he never received lab letter.  Advised pt labs overall ok but blood sugar slightly elevated.  Pt verbalized understanding.  Dgaddy, CMA

## 2018-01-26 MED FILL — LEVEMIR FLEXTOUCH 100 UNITS: 100 | 28 days supply | Qty: 30 | Fill #4

## 2018-01-26 MED FILL — FREESTYLE LANCETS: 90 days supply | Qty: 200 | Fill #1

## 2018-01-26 MED FILL — AMLODIPINE BESYLATE 5 MG TA: 5 | 90 days supply | Qty: 90 | Fill #1

## 2018-01-26 MED FILL — FREESTYLE LITE TEST STRIP: 90 days supply | Qty: 200 | Fill #1

## 2018-01-26 MED FILL — ATORVASTATIN CALCIUM 20 MG: 20 | 90 days supply | Qty: 90 | Fill #1

## 2018-01-31 ENCOUNTER — Other Ambulatory Visit: Payer: Self-pay | Admitting: Family Medicine

## 2018-01-31 DIAGNOSIS — I10 Essential (primary) hypertension: Secondary | ICD-10-CM

## 2018-01-31 DIAGNOSIS — E785 Hyperlipidemia, unspecified: Secondary | ICD-10-CM

## 2018-02-01 MED FILL — HYDROCODON-APAP 10-325: 10-325 | 4 days supply | Qty: 20 | Fill #0

## 2018-02-01 MED FILL — PENICILLIN VK 500 MG TABLET: 500 | 10 days supply | Qty: 40 | Fill #0

## 2018-02-10 ENCOUNTER — Other Ambulatory Visit: Payer: Self-pay | Admitting: Family Medicine

## 2018-02-10 DIAGNOSIS — G6289 Other specified polyneuropathies: Secondary | ICD-10-CM

## 2018-02-10 MED FILL — JANUMET XR 50-1,000 MG TAB: 50-1000 | 30 days supply | Qty: 60 | Fill #7

## 2018-02-10 MED FILL — GABAPENTIN 100 MG CAPS: 100 | 90 days supply | Qty: 180 | Fill #0

## 2018-02-10 NOTE — Telephone Encounter (Signed)
Requested Prescriptions  Pending Prescriptions Disp Refills  . gabapentin (NEURONTIN) 100 MG capsule [Pharmacy Med Name: GABAPENTIN 100 MG CAP 100 CAP] 180 capsule 1    Sig: TAKE 1 CAPSULE BY MOUTH 2 TIMES A DAY     Neurology: Anticonvulsants - gabapentin Passed - 02/10/2018  1:05 PM      Passed - Valid encounter within last 12 months    Recent Outpatient Visits          1 month ago Cough   [facility] [place], MD   3 months ago Essential hypertension   [facility] [place], Ranell Patrick, MD   7 months ago Other polyneuropathy   [facility] [place], MD   8 months ago Essential hypertension   [facility] [place], MD   8 months ago Essential hypertension   [facility] [place], Ranell Patrick, MD

## 2018-02-20 MED FILL — LEVEMIR FLEXTOUCH 100 UNITS: 100 | 28 days supply | Qty: 30 | Fill #5

## 2018-03-13 ENCOUNTER — Other Ambulatory Visit: Payer: Self-pay | Admitting: Endocrinology

## 2018-03-13 MED FILL — JANUMET XR 50-1,000 MG TAB: 50-1000 | 30 days supply | Qty: 60 | Fill #8

## 2018-03-13 MED FILL — OMEPRAZOLE 20 MG CPDR: 20 | 90 days supply | Qty: 90 | Fill #1

## 2018-03-14 MED FILL — LEVEMIR FLEXTOUCH 100 UNITS: 100 | 28 days supply | Qty: 15 | Fill #0

## 2018-04-11 ENCOUNTER — Ambulatory Visit: Payer: Self-pay | Admitting: Endocrinology

## 2018-04-12 ENCOUNTER — Telehealth: Payer: Self-pay | Admitting: Endocrinology

## 2018-04-12 NOTE — Telephone Encounter (Signed)
Please refer to Dr. Ellison's response 

## 2018-04-12 NOTE — Telephone Encounter (Signed)
LMTCB to reschedule missed appointment °

## 2018-04-12 NOTE — Telephone Encounter (Signed)
Please schedule f/u appt for next available appointment  

## 2018-04-12 NOTE — Telephone Encounter (Signed)
Patient no showed today's appt. Please advise on how to follow up. °A. No follow up necessary. °B. Follow up urgent. Contact patient immediately. °C. Follow up necessary. Contact patient and schedule visit in ___ days. °D. Follow up advised. Contact patient and schedule visit in ____weeks. ° °Would you like the NS fee to be applied to this visit? ° °

## 2018-04-14 ENCOUNTER — Ambulatory Visit (INDEPENDENT_AMBULATORY_CARE_PROVIDER_SITE_OTHER): Payer: 59 | Admitting: Endocrinology

## 2018-04-14 ENCOUNTER — Other Ambulatory Visit: Payer: Self-pay

## 2018-04-14 ENCOUNTER — Encounter: Payer: Self-pay | Admitting: Endocrinology

## 2018-04-14 VITALS — BP 152/80 | HR 71 | Ht 72.0 in | Wt 276.2 lb

## 2018-04-14 DIAGNOSIS — Z794 Long term (current) use of insulin: Secondary | ICD-10-CM | POA: Diagnosis not present

## 2018-04-14 DIAGNOSIS — E119 Type 2 diabetes mellitus without complications: Secondary | ICD-10-CM | POA: Diagnosis not present

## 2018-04-14 LAB — POCT GLYCOSYLATED HEMOGLOBIN (HGB A1C): Hemoglobin A1C: 8.9 % — AB (ref 4.0–5.6)

## 2018-04-14 MED ORDER — INSULIN DETEMIR 100 UNIT/ML FLEXPEN: 80.0000 [IU] | PEN_INJECTOR | SUBCUTANEOUS | 11 refills | Status: DC

## 2018-04-14 MED ORDER — DULAGLUTIDE 1.5 MG/0.5ML ~~LOC~~ SOAJ: 1.5000 mg | SUBCUTANEOUS | 11 refills | Status: DC

## 2018-04-14 MED ORDER — METFORMIN HCL ER 500 MG PO TB24: 2000.0000 mg | ORAL_TABLET | Freq: Every day | ORAL | 3 refills | Status: DC

## 2018-04-14 MED ORDER — FREESTYLE LITE DEVI: 1.0000 | Freq: Every day | 0 refills | Status: DC

## 2018-04-14 MED FILL — metFORMIN HCL ER 500 MG TB2: 500 | 90 days supply | Qty: 360 | Fill #0

## 2018-04-14 MED FILL — FREESTYLE LITE METER: 30 days supply | Qty: 1 | Fill #0

## 2018-04-14 MED FILL — UNIFINE PENTIPS 8MM 31G: 31G X 8 MM | 50 days supply | Qty: 100 | Fill #0

## 2018-04-14 NOTE — Progress Notes (Signed)
Subjective:    Patient ID: James Garcia, male    DOB: [birth year], 63 y.o.   MRN: [id number]  HPI Pt returns for f/u of diabetes mellitus: DM type: Insulin-requiring type 2 Dx'ed: 1025 Complications: polyneuropathy and DR Therapy: insulin since 2016, and janumet.  DKA: never Severe hypoglycemia: never Pancreatitis: never Other: he declines multiple daily injections; he works 2nd shift. QD insulin was changed to levemir, due to pattern of cbg's.   Interval history: Pt says he never misses the insulin. Meter is downloaded today, and the printout is scanned into the record.  There are 4 cbg's (111-200).  He stopped farxiga, due to itching.  No recent steroids.   Past Medical History:  Diagnosis Date  . Constipation    uses OTC laxatives - hard stools every morning- small amount and feels like doesnt empty   . Diabetes mellitus without complication ([place])   . DM type 2 (diabetes mellitus, type 2) ([place])   . Hyperlipidemia   . Hypertension   . Leg cramps   . 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 Tuberculosis    pt states was treated for TB    Past Surgical History:  Procedure Laterality Date  . arm surgery    . COLONOSCOPY    . ELBOW SURGERY    . FOOT SURGERY Bilateral   . POLYPECTOMY      Social History   Socioeconomic History  . Marital status: Single    Spouse name: Not on file  . Number of children: Not on file  . Years of education: Not on file  . Highest education level: Not on file  Occupational History  . Occupation: Engineer, maintenance (IT) at [employer]  . Financial resource strain: Not on file  . Food insecurity:    Worry: Not on file    Inability: Not on file  . Transportation needs:    Medical: Not on file    Non-medical: Not on file  Tobacco Use  . Smoking status: Former Smoker    Packs/day: 0.25    Years: 5.00    Pack years: 1.25    Types: Cigarettes    Last attempt to quit: 06/04/1978    Years since quitting: 39.8  . Smokeless tobacco: Never Used   Substance and Sexual Activity  . Alcohol use: No  . Drug use: No  . Sexual activity: Not on file  Lifestyle  . Physical activity:    Days per week: Not on file    Minutes per session: Not on file  . Stress: Not on file  Relationships  . Social connections:    Talks on phone: Not on file    Gets together: Not on file    Attends religious service: Not on file    Active member of club or organization: Not on file    Attends meetings of clubs or organizations: Not on file    Relationship status: Not on file  . Intimate partner violence:    Fear of current or ex partner: Not on file    Emotionally abused: Not on file    Physically abused: Not on file    Forced sexual activity: Not on file  Other Topics Concern  . Not on file  Social History Narrative   ** Merged History Encounter **        Current Outpatient Medications on File Prior to Visit  Medication Sig Dispense Refill  . albuterol (PROVENTIL HFA;VENTOLIN HFA) 108 (90 Base)  MCG/ACT inhaler Inhale 1-2 puffs into the lungs every 4 (four) hours as needed for wheezing or shortness of breath. 1 Inhaler 0  . amLODipine (NORVASC) 5 MG tablet TAKE 1 TABLET (5 MG TOTAL) BY MOUTH DAILY. 90 tablet 1  . atorvastatin (LIPITOR) 20 MG tablet TAKE 1 TABLET BY MOUTH DAILY. 90 tablet 1  . Blood Pressure Monitor KIT Use as instructed for HTN 1 each 0  . gabapentin (NEURONTIN) 100 MG capsule TAKE 1 CAPSULE BY MOUTH 2 TIMES A DAY 180 capsule 1  . glucose blood (FREESTYLE LITE) test strip USE TO CHECK BLOOD SUGAR 2 TIMES PER DAY. 200 each 2  . Lancets (FREESTYLE) lancets USE TO CHECK BLOOD SUGAR 2 TIMES PER DAY. 200 each 2  . losartan (COZAAR) 100 MG tablet Take 1 tablet (100 mg total) by mouth daily. 90 tablet 0  . omeprazole (PRILOSEC) 20 MG capsule Take 1 capsule (20 mg total) by mouth every morning. 90 capsule 1   Current Facility-Administered Medications on File Prior to Visit  Medication Dose Route Frequency Provider Last Rate Last Dose    . 0.9 %  sodium chloride infusion  500 mL Intravenous Once Pyrtle, Lajuan Lines, MD        No Known Allergies  Family History  Problem Relation Age of Onset  . Diabetes Father   . Colon cancer Neg Hx   . Colon polyps Neg Hx   . Rectal cancer Neg Hx   . Stomach cancer Neg Hx     BP (!) 152/80 (BP Location: Left Arm, Patient Position: Sitting, Cuff Size: Normal)   Pulse 71   Ht 6' (1.829 m)   Wt 276 lb 3.2 oz (125.3 kg)   SpO2 97%   BMI 37.46 kg/m    Review of Systems He denies hypoglycemia    Objective:   Physical Exam VITAL SIGNS:  See vs page GENERAL: no distress Pulses: dorsalis pedis intact bilat.   MSK: no deformity of the feet CV: 1+ bilat leg edema Skin:  no ulcer on the feet.  normal color and temp on the feet. Neuro: sensation is intact to touch on the feet, but decreased from normal Ext: There is bilateral onychomycosis of the toenails.  Lab Results  Component Value Date   HGBA1C 8.9 (A) 04/14/2018       Assessment & Plan:  Insulin-requiring type 2 DM, with DR: worse.  Obesity: persistent: he wants this considered in his DM rx. HTN: is noted today.  Edema: this limits rx options.  Itching: we discussed.  he declines to try an alternative to [place].  Patient Instructions  Your blood pressure is high today.  Please see your primary care provider soon, to have it rechecked.   I have sent a prescription to your pharmacy, to change janumet to metformin and Trulicity.  Please reduce the insulin to 80 units each morning.   check your blood sugar twice a day.  vary the time of day when you check, between before the 3 meals, and at bedtime.  also check if you have symptoms of your blood sugar being too high or too low.  please keep a record of the readings and bring it to your next appointment here (or you can bring the meter itself).  You can write it on any piece of paper.  please call us sooner if your blood sugar goes below 70, or if you have a lot of readings  over 200. Please come back for a follow-up  appointment in 2 months.    Su presin arterial est alta hoy. Consulte a su proveedor de atencin primaria pronto para que se vuelva a Musician. He enviado una receta a su farmacia, para cambiar janumet a metformina y Musician. Reduzca la insulina a 80 unidades cada maana. controle su nivel de azcar en [place]. vare la hora del da cuando realiza la comprobacin, entre antes de las 3 comidas y antes de [place]. Tambin verifique si tiene sntomas de que su nivel de azcar en la sangre es demasiado alto o [place]. mantenga un registro de las lecturas y Air cabin crew a su prxima cita aqu (o puede traer Nature conservation officer). Puedes escribirlo en cualquier hoja de papel. llmenos antes si su nivel de azcar en la sangre est por debajo de 70, o si tiene muchas lecturas por encima de 200. Regrese para una cita de seguimiento en 2 meses.

## 2018-04-14 NOTE — Patient Instructions (Addendum)
Your blood pressure is high today.  Please see your primary care provider soon, to have it rechecked.   I have sent a prescription to your pharmacy, to change janumet to metformin and Trulicity.  Please reduce the insulin to 80 units each morning.   check your blood sugar twice a day.  vary the time of day when you check, between before the 3 meals, and at bedtime.  also check if you have symptoms of your blood sugar being too high or too low.  please keep a record of the readings and bring it to your next appointment here (or you can bring the meter itself).  You can write it on any piece of paper.  please call us sooner if your blood sugar goes below 70, or if you have a lot of readings over 200. Please come back for a follow-up appointment in 2 months.    Su presin arterial est alta hoy. Consulte a su proveedor de atencin primaria pronto para que se vuelva a Musician. He enviado una receta a su farmacia, para cambiar janumet a metformina y Musician. Reduzca la insulina a 80 unidades cada maana. controle su nivel de azcar en [place]. vare la hora del da cuando realiza la comprobacin, entre antes de las 3 comidas y antes de [place]. Tambin verifique si tiene sntomas de que su nivel de azcar en la sangre es demasiado alto o [place]. mantenga un registro de las lecturas y Air cabin crew a su prxima cita aqu (o puede traer Nature conservation officer). Puedes escribirlo en cualquier hoja de papel. llmenos antes si su nivel de azcar en la sangre est por debajo de 70, o si tiene muchas lecturas por encima de 200. Regrese para una cita de seguimiento en 2 meses.

## 2018-04-18 ENCOUNTER — Telehealth: Payer: Self-pay

## 2018-04-18 MED FILL — LEVEMIR FLEXTOUCH 100 UNITS: 100 | 30 days supply | Qty: 24 | Fill #0

## 2018-04-18 NOTE — Telephone Encounter (Signed)
PA initiated today through Cover My Meds for Trulicity. Will await insurance response re: approval/denial. Hudsyn Champine (Key: [id number])  Trulicity 1.5MG /0.5ML pen-injectors  Form MedImpact ePA Form: Created 6 minutes ago  Sent to Plan: 2 minutes ago  Plan Response: 2 minutes ago  Submit Clinical Questions: less than a minute ago  Determination: Wait for Determination. Please wait for MedImpact to return a determination.

## 2018-04-20 MED FILL — TRULICITY 1.5 MG/0.5 ML PEN: 1.5 | 28 days supply | Qty: 2 | Fill #0

## 2018-04-25 NOTE — Telephone Encounter (Signed)
Received notification from [place] that [place] for Trulicity has been approved 04/20/18 through 04/20/19, PA Reference #[id number], Plan Code: [id number]. Documents have been labeled and placed in scan file for HIM and for our future reference.

## 2018-05-08 ENCOUNTER — Ambulatory Visit (HOSPITAL_COMMUNITY)
Admission: EM | Admit: 2018-05-08 | Discharge: 2018-05-08 | Disposition: A | Discharge status: Home / Self Care | Payer: 59 | Attending: Family Medicine | Admitting: Family Medicine

## 2018-05-08 ENCOUNTER — Encounter (HOSPITAL_COMMUNITY): Payer: Self-pay | Admitting: Family Medicine

## 2018-05-08 DIAGNOSIS — S39012A Strain of muscle, fascia and tendon of lower back, initial encounter: Secondary | ICD-10-CM

## 2018-05-08 MED ORDER — CYCLOBENZAPRINE HCL 5 MG PO TABS: 5.0000 mg | ORAL_TABLET | Freq: Every day | ORAL | 0 refills | Status: DC

## 2018-05-08 MED ORDER — BENZONATATE 100 MG PO CAPS: 100.0000 mg | ORAL_CAPSULE | Freq: Three times a day (TID) | ORAL | 0 refills | Status: DC | PRN

## 2018-05-08 MED ORDER — DICLOFENAC SODIUM 75 MG PO TBEC: 75.0000 mg | DELAYED_RELEASE_TABLET | Freq: Two times a day (BID) | ORAL | 0 refills | Status: DC

## 2018-05-08 MED ORDER — HYDROCODONE-ACETAMINOPHEN 5-325 MG PO TABS: 1.0000 | ORAL_TABLET | Freq: Four times a day (QID) | ORAL | 0 refills | Status: DC | PRN

## 2018-05-08 MED FILL — BENZONATATE 100 MG CAPS: 100 | 7 days supply | Qty: 40 | Fill #0

## 2018-05-08 MED FILL — CYCLOBENZAPRINE 5 MG TABLET: 5 | 7 days supply | Qty: 7 | Fill #0

## 2018-05-08 MED FILL — DICLOFENAC SODIUM 75 MG TAB: 75 | 7 days supply | Qty: 14 | Fill #0

## 2018-05-08 MED FILL — HYDROCODON-APAP 5-325: 5-325 | 3 days supply | Qty: 12 | Fill #0

## 2018-05-08 NOTE — ED Provider Notes (Addendum)
[facility]    CSN: [id number] Arrival date & time: 05/08/18  1307     History   Chief Complaint Chief Complaint  Patient presents with  . Back Pain  . Cough    HPI James Garcia is a 63 y.o. male.   First [facility] visit in over 3 years.  He complains of back pain and cough.  The pain is sharp and in the right paralumbar area.  No fever, abdominal pain, h/o trauma, asthma, or diaphoresis.  No urinary symptoms  Patient does floor cleaning at the hospital.   He has no h/o similar pain  He has h/o diabetes.  Levels are running 180-200     Past Medical History:  Diagnosis Date  . Constipation    uses OTC laxatives - hard stools every morning- small amount and feels like doesnt empty   . Diabetes mellitus without complication ([place])   . DM type 2 (diabetes mellitus, type 2) ([place])   . Hyperlipidemia   . Hypertension   . Leg cramps   . 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 Tuberculosis    pt states was treated for TB    Patient Active Problem List   Diagnosis Date Noted  . Screening for prostate cancer 12/24/2016  . Diabetes ([place]) 05/01/2015  . Thrombocytopenia ([place]) 06/04/2014  . TB (tuberculosis), treated 03/31/2013  . Cough 03/30/2013    Past Surgical History:  Procedure Laterality Date  . arm surgery    . COLONOSCOPY    . ELBOW SURGERY    . FOOT SURGERY Bilateral   . POLYPECTOMY         Home Medications    Prior to Admission medications   Medication Sig Start Date End Date Taking? Authorizing Provider  albuterol (PROVENTIL HFA;VENTOLIN HFA) 108 (90 Base) MCG/ACT inhaler Inhale 1-2 puffs into the lungs every 4 (four) hours as needed for wheezing or shortness of breath. 12/24/17   Wendie Agreste, MD  amLODipine (NORVASC) 5 MG tablet TAKE 1 TABLET (5 MG TOTAL) BY MOUTH DAILY. 02/01/18   Wendie Agreste, MD  atorvastatin (LIPITOR) 20 MG tablet TAKE 1 TABLET BY MOUTH DAILY. 02/01/18   Wendie Agreste, MD  benzonatate (TESSALON) 100 MG  capsule Take 1-2 capsules (100-200 mg total) by mouth 3 (three) times daily as needed for cough. 05/08/18   Robyn Haber, MD  Blood Glucose Monitoring Suppl (FREESTYLE LITE) DEVI 1 kit by Does not apply route daily. Use to test blood sugars daily 04/14/18   Renato Shin, MD  Blood Pressure Monitor KIT Use as instructed for HTN 07/12/17   Wendie Agreste, MD  cyclobenzaprine (FLEXERIL) 5 MG tablet Take 1 tablet (5 mg total) by mouth at bedtime. 05/08/18   Robyn Haber, MD  diclofenac (VOLTAREN) 75 MG EC tablet Take 1 tablet (75 mg total) by mouth 2 (two) times daily. 05/08/18   Robyn Haber, MD  Dulaglutide (TRULICITY) 1.5 VO/5.3GU SOPN Inject 1.5 mg into the skin once a week. 04/14/18   Renato Shin, MD  gabapentin (NEURONTIN) 100 MG capsule TAKE 1 CAPSULE BY MOUTH 2 TIMES A DAY 02/10/18   Wendie Agreste, MD  glucose blood (FREESTYLE LITE) test strip USE TO CHECK BLOOD SUGAR 2 TIMES PER DAY. 11/02/17   Renato Shin, MD  HYDROcodone-acetaminophen (NORCO) 5-325 MG tablet Take 1 tablet by mouth every 6 (six) hours as needed for moderate pain. 05/08/18   Robyn Haber, MD  Insulin Detemir (LEVEMIR FLEXTOUCH) 100 UNIT/ML Pen  Inject 80 Units into the skin every morning. And pen needles 2/day 04/14/18   Renato Shin, MD  Lancets (FREESTYLE) lancets USE TO CHECK BLOOD SUGAR 2 TIMES PER DAY. 11/02/17   Renato Shin, MD  losartan (COZAAR) 100 MG tablet Take 1 tablet (100 mg total) by mouth daily. 11/30/17   Wendie Agreste, MD  metFORMIN (GLUCOPHAGE-XR) 500 MG 24 hr tablet Take 4 tablets (2,000 mg total) by mouth daily with breakfast. 04/14/18   Renato Shin, MD  omeprazole (PRILOSEC) 20 MG capsule Take 1 capsule (20 mg total) by mouth every morning. 07/20/17   Wendie Agreste, MD    Family History Family History  Problem Relation Age of Onset  . Diabetes Father   . Colon cancer Neg Hx   . Colon polyps Neg Hx   . Rectal cancer Neg Hx   . Stomach cancer Neg Hx     Social  History Social History   Tobacco Use  . Smoking status: Former Smoker    Packs/day: 0.25    Years: 5.00    Pack years: 1.25    Types: Cigarettes    Last attempt to quit: 06/04/1978    Years since quitting: 39.9  . Smokeless tobacco: Never Used  Substance Use Topics  . Alcohol use: No  . Drug use: No     Allergies   Patient has no known allergies.   Review of Systems Review of Systems   Physical Exam Triage Vital Signs ED Triage Vitals  Enc Vitals Group     BP      Pulse      Resp      Temp      Temp src      SpO2      Weight      Height      Head Circumference      Peak Flow      Pain Score      Pain Loc      Pain Edu?      Excl. in [place]?    No data found.  Updated Vital Signs BP (!) 148/86   Pulse 79   Temp 97.7 F (36.5 C)   Resp 18   SpO2 100%    Physical Exam Vitals signs and nursing note reviewed.  Constitutional:      Appearance: Normal appearance. He is obese.  HENT:     Mouth/Throat:     Mouth: Mucous membranes are moist.     Pharynx: Oropharynx is clear.  Eyes:     Conjunctiva/sclera: Conjunctivae normal.  Neck:     Musculoskeletal: Normal range of motion and neck supple.  Cardiovascular:     Rate and Rhythm: Normal rate and regular rhythm.     Heart sounds: Normal heart sounds.  Pulmonary:     Effort: Pulmonary effort is normal.     Breath sounds: Normal breath sounds.  Abdominal:     General: Bowel sounds are normal.  Musculoskeletal:     Comments: Patient moves cautiously when lying down or sitting up  SLR positive at 60 degrees on right.  No muscle wasting.  Skin:    General: Skin is warm and dry.  Neurological:     General: No focal deficit present.     Mental Status: He is alert.  Psychiatric:        Mood and Affect: Mood normal.      UC Treatments / Results  Labs (all labs ordered are listed, but only  abnormal results are displayed) Labs Reviewed - No data to display  EKG None  Radiology No results  found.  Procedures Procedures (including critical care time)  Medications Ordered in UC Medications - No data to display  Initial Impression / Assessment and Plan / UC Course  I have reviewed the triage vital signs and the nursing notes.  Pertinent labs & imaging results that were available during my care of the patient were reviewed by me and considered in my medical decision making (see chart for details).    Final Clinical Impressions(s) / UC Diagnoses   Final diagnoses:  Strain of lumbar region, initial encounter   Discharge Instructions   None    ED Prescriptions    Medication Sig Dispense Auth. Provider   benzonatate (TESSALON) 100 MG capsule Take 1-2 capsules (100-200 mg total) by mouth 3 (three) times daily as needed for cough. 40 capsule Robyn Haber, MD   HYDROcodone-acetaminophen (NORCO) 5-325 MG tablet Take 1 tablet by mouth every 6 (six) hours as needed for moderate pain. 12 tablet Robyn Haber, MD   diclofenac (VOLTAREN) 75 MG EC tablet Take 1 tablet (75 mg total) by mouth 2 (two) times daily. 14 tablet Robyn Haber, MD   cyclobenzaprine (FLEXERIL) 5 MG tablet Take 1 tablet (5 mg total) by mouth at bedtime. 7 tablet Robyn Haber, MD     Controlled Substance Prescriptions [place] Controlled Substance Registry consulted? Not Applicable   Robyn Haber, MD 05/08/18 1443    Robyn Haber, MD 05/08/18 [phone]

## 2018-05-08 NOTE — ED Triage Notes (Signed)
Pt c/o back pain, lower back pain, since Friday, states pain worse with movement, with cough.

## 2018-05-15 MED FILL — ATORVASTATIN 20 MG TABLET: 20 | 90 days supply | Qty: 90 | Fill #0

## 2018-05-15 MED FILL — AMLODIPINE BESYLATE 5 MG TA: 5 | 90 days supply | Qty: 90 | Fill #0

## 2018-05-15 MED FILL — GABAPENTIN 100 MG CAPSULE: 100 | 90 days supply | Qty: 180 | Fill #1

## 2018-05-16 ENCOUNTER — Telehealth: Payer: Self-pay | Admitting: Endocrinology

## 2018-05-16 MED FILL — TRULICITY 1.5 MG/0.5 ML PEN: 1.5 | 28 days supply | Qty: 2 | Fill #1 | Status: TO

## 2018-05-16 MED FILL — LEVEMIR FLEXTOUCH 100 UNITS: 100 | 30 days supply | Qty: 24 | Fill #1 | Status: TO

## 2018-05-16 NOTE — Telephone Encounter (Signed)
Please increase levemir to 90 units qam. I'll see you next time.

## 2018-05-16 NOTE — Telephone Encounter (Signed)
Patient requests to be called at ph# [phone] re: patient's blood sugars have been running high (200-250's).

## 2018-05-16 NOTE — Telephone Encounter (Signed)
1. Are you taking any type of steroids? No  2. Are you sick or becoming sick? No  3. Is this the first elevated blood sugar reading? No  4. Have you tried to correct it with insulin? No  5. Have you been taking/taken today all of the prescribed medications? Yes  6. What is your current diabetic insulin or oral medication dosage? Metformin 4 tablets at breakfast, Levemir and Trulicity   Date Time Reading Notes       05/16/18 AM 224 N/A  05/15/18 PM 124 N/A  Pt does not have access to his meter to provide additional readings. States range is 200-240 since adding Metformin                                Please advise

## 2018-05-16 NOTE — Telephone Encounter (Signed)
Called pt and informed of new orders. Verbalized acceptance and understanding. 

## 2018-05-22 DIAGNOSIS — H3563 Retinal hemorrhage, bilateral: Secondary | ICD-10-CM | POA: Diagnosis not present

## 2018-05-22 DIAGNOSIS — E119 Type 2 diabetes mellitus without complications: Secondary | ICD-10-CM | POA: Diagnosis not present

## 2018-05-22 DIAGNOSIS — E113393 Type 2 diabetes mellitus with moderate nonproliferative diabetic retinopathy without macular edema, bilateral: Secondary | ICD-10-CM | POA: Diagnosis not present

## 2018-05-22 DIAGNOSIS — H35033 Hypertensive retinopathy, bilateral: Secondary | ICD-10-CM | POA: Diagnosis not present

## 2018-05-22 LAB — HM DIABETES EYE EXAM

## 2018-06-07 MED FILL — LEVEMIR FLEXTOUCH 100 UNITS: 100 | 30 days supply | Qty: 24 | Fill #0

## 2018-06-07 MED FILL — TRULICITY 1.5 MG/0.5 ML PEN: 1.5 | 28 days supply | Qty: 2 | Fill #0

## 2018-06-13 ENCOUNTER — Ambulatory Visit (INDEPENDENT_AMBULATORY_CARE_PROVIDER_SITE_OTHER): Payer: 59 | Admitting: Endocrinology

## 2018-06-13 ENCOUNTER — Other Ambulatory Visit: Payer: Self-pay

## 2018-06-13 ENCOUNTER — Encounter: Payer: Self-pay | Admitting: Family Medicine

## 2018-06-13 DIAGNOSIS — E119 Type 2 diabetes mellitus without complications: Secondary | ICD-10-CM

## 2018-06-13 DIAGNOSIS — Z794 Long term (current) use of insulin: Secondary | ICD-10-CM

## 2018-06-13 NOTE — Patient Instructions (Signed)
I have sent a prescription to your pharmacy, to change janumet to metformin and Trulicity.  Please reduce the insulin to 80 units each morning.   check your blood sugar twice a day.  vary the time of day when you check, between before the 3 meals, and at bedtime.  also check if you have symptoms of your blood sugar being too high or too low.  please keep a record of the readings and bring it to your next appointment here (or you can bring the meter itself).  You can write it on any piece of paper.  please call us sooner if your blood sugar goes below 70, or if you have a lot of readings over 200. Please come back for a follow-up appointment in 2 months.

## 2018-06-13 NOTE — Progress Notes (Signed)
   Subjective:    Patient ID: James Garcia, male    DOB: 06-Jul-1955, 63 y.o.   MRN: [id number]  HPI I called [phone] at 14:21--no answer.  Staff also called pt for telephone visit.  No answer  Review of Systems     Objective:   Physical Exam        Assessment & Plan:

## 2018-06-16 ENCOUNTER — Ambulatory Visit: Payer: Self-pay | Admitting: Family Medicine

## 2018-06-16 NOTE — Telephone Encounter (Signed)
Pt called in c/o both of his legs being swollen to above his ankles.   No shortness of breath or chest pain.  Due to the coronavirus pandemic I let him know the office will be calling him to set up an appt.   He requested a phone call because not have video capability.   He also prefers a [place] speaking doctor.  He is agreeable to a phone call visit.   Please call on Monday to set up the appt.  l He is going to work now.   He is off all next week.     Reason for Disposition . [1] MILD swelling of both ankles (i.e., pedal edema) AND [2] new onset or worsening  Answer Assessment - Initial Assessment Questions 1. ONSET: "When did the swelling start?" (e.g., minutes, hours, days)     Both legs swollen.     Been swelling for a long while.   I check my sugars twice a day.   2. LOCATION: "What part of the leg is swollen?"  "Are both legs swollen or just one leg?"     I have indentions from my socks.  Swelling is to the top of my ankles. 3. SEVERITY: "How bad is the swelling?" (e.g., localized; mild, moderate, severe)  - Localized - small area of swelling localized to one leg  - MILD pedal edema - swelling limited to foot and ankle, pitting edema < 1/4 inch (6 mm) deep, rest and elevation eliminate most or all swelling  - MODERATE edema - swelling of lower leg to knee, pitting edema > 1/4 inch (6 mm) deep, rest and elevation only partially reduce swelling  - SEVERE edema - swelling extends above knee, facial or hand swelling present      Sometimes they are tight and then get floppy and tight again.   4. REDNESS: "Does the swelling look red or infected?"     No 5. PAIN: "Is the swelling painful to touch?" If so, ask: "How painful is it?"   (Scale 1-10; mild, moderate or severe)     No pain 6. FEVER: "Do you have a fever?" If so, ask: "What is it, how was it measured, and when did it start?"      No 7. CAUSE: "What do you think is causing the leg swelling?"     Maybe high sugar. 8. MEDICAL  HISTORY: "Do you have a history of heart failure, kidney disease, liver failure, or cancer?"     Diabetes, high BP 9. RECURRENT SYMPTOM: "Have you had leg swelling before?" If so, ask: "When was the last time?" "What happened that time?"     Never swollen before 10. OTHER SYMPTOMS: "Do you have any other symptoms?" (e.g., chest pain, difficulty breathing)       I have chronic bronchitis.    11. PREGNANCY: "Is there any chance you are pregnant?" "When was your last menstrual period?"       N/A  Protocols used: LEG SWELLING AND EDEMA-A-AH

## 2018-06-16 NOTE — Telephone Encounter (Signed)
Agrees to telemed please set that up and would like a [place]anish speaking provider so if Dr. Mitchel Honour or Dr. Pamella Pert has availability would be preferred.

## 2018-07-04 MED FILL — LEVEMIR FLEXTOUCH 100 UNITS: 100 | 30 days supply | Qty: 24 | Fill #1

## 2018-07-04 MED FILL — TRULICITY 1.5 MG/0.5 ML PEN: 1.5 | 28 days supply | Qty: 2 | Fill #1 | Status: TO

## 2018-07-07 MED FILL — metFORMIN HCL ER 500 MG TB2: 500 | 90 days supply | Qty: 360 | Fill #0

## 2018-07-11 ENCOUNTER — Telehealth: Payer: Self-pay | Admitting: Endocrinology

## 2018-07-11 MED ORDER — INSULIN DETEMIR 100 UNIT/ML FLEXPEN: 100.0000 [IU] | PEN_INJECTOR | SUBCUTANEOUS | 3 refills | Status: DC

## 2018-07-11 NOTE — Telephone Encounter (Signed)
Please advise 

## 2018-07-11 NOTE — Telephone Encounter (Signed)
Please increase the insulin to 90 units each morning.

## 2018-07-11 NOTE — Telephone Encounter (Signed)
Please increase to 100 units qam.  I have sent a prescription to your pharmacy

## 2018-07-11 NOTE — Addendum Note (Signed)
Addended by: Renato Shin on: 07/11/2018 01:30 PM   Modules accepted: Orders

## 2018-07-11 NOTE — Telephone Encounter (Signed)
Pt returned call. Informed of orders. Verbalized acceptance and understanding.

## 2018-07-11 NOTE — Telephone Encounter (Signed)
Called pt at requested # he provided to inform of new orders and Rx. LVM requesting returned call.

## 2018-07-11 NOTE — Telephone Encounter (Signed)
Called pt to make him aware of new orders. States he has been taking 90 units of Levemir qAM. Wants to know if he should increase to 100 units. Also states a Rx will need to be sent to his pharmacy, out of insulin and refills. Requesting returned call at [phone].

## 2018-07-11 NOTE — Telephone Encounter (Signed)
Patient states his blood sugar is running high, yesterday afternoon 180, this morning it was 212.  Please Advise, Thanks

## 2018-07-28 ENCOUNTER — Other Ambulatory Visit: Payer: Self-pay | Admitting: Family Medicine

## 2018-07-28 DIAGNOSIS — G6289 Other specified polyneuropathies: Secondary | ICD-10-CM

## 2018-07-28 MED FILL — FREESTYLE LANCETS: 90 days supply | Qty: 200 | Fill #0

## 2018-07-28 MED FILL — LEVEMIR FLEXTOUCH 100 UNITS: 100 | 90 days supply | Qty: 90 | Fill #0

## 2018-07-28 MED FILL — FREESTYLE LITE TEST STRIP: 90 days supply | Qty: 200 | Fill #0

## 2018-07-29 MED FILL — UNIFINE PENTIPS 8MM 31G: 31G X 8 MM | 50 days supply | Qty: 100 | Fill #0

## 2018-08-09 MED FILL — GABAPENTIN 100 MG CAPSULE: 100 | 90 days supply | Qty: 180 | Fill #0

## 2018-08-09 MED FILL — TRULICITY 1.5 MG/0.5 ML PEN: 1.5 | 28 days supply | Qty: 2 | Fill #0

## 2018-09-08 MED FILL — TRULICITY 1.5 MG/0.5 ML PEN: 1.5 | 28 days supply | Qty: 2 | Fill #0

## 2018-09-12 MED FILL — AMLODIPINE BESYLATE 5 MG TA: 5 | 90 days supply | Qty: 90 | Fill #1

## 2018-09-12 MED FILL — ATORVASTATIN 20 MG TABLET: 20 | 90 days supply | Qty: 90 | Fill #1

## 2018-10-06 MED FILL — TRULICITY 1.5 MG/0.5 ML PEN: 1.5 | 28 days supply | Qty: 2 | Fill #0

## 2018-10-09 MED FILL — metFORMIN HCL ER 500 MG TB2: 500 | 90 days supply | Qty: 360 | Fill #0

## 2018-10-26 MED FILL — LEVEMIR FLEXTOUCH 100 UNITS: 100 | 90 days supply | Qty: 90 | Fill #0

## 2018-10-31 DIAGNOSIS — H2513 Age-related nuclear cataract, bilateral: Secondary | ICD-10-CM | POA: Diagnosis not present

## 2018-10-31 DIAGNOSIS — E113393 Type 2 diabetes mellitus with moderate nonproliferative diabetic retinopathy without macular edema, bilateral: Secondary | ICD-10-CM | POA: Diagnosis not present

## 2018-10-31 DIAGNOSIS — H04123 Dry eye syndrome of bilateral lacrimal glands: Secondary | ICD-10-CM | POA: Diagnosis not present

## 2018-10-31 DIAGNOSIS — H35033 Hypertensive retinopathy, bilateral: Secondary | ICD-10-CM | POA: Diagnosis not present

## 2018-10-31 LAB — HM DIABETES EYE EXAM

## 2018-11-02 MED FILL — TRULICITY 1.5 MG/0.5 ML PEN: 1.5 | 28 days supply | Qty: 2 | Fill #1

## 2018-11-02 MED FILL — UNIFINE PENTIPS 8MM 31G: 31G X 8 MM | 50 days supply | Qty: 100 | Fill #0

## 2018-11-03 ENCOUNTER — Encounter: Payer: Self-pay | Admitting: Family Medicine

## 2018-11-09 MED FILL — GABAPENTIN 100 MG CAPSULE: 100 | 90 days supply | Qty: 180 | Fill #1

## 2018-11-15 DIAGNOSIS — H532 Diplopia: Secondary | ICD-10-CM | POA: Diagnosis not present

## 2018-11-21 ENCOUNTER — Other Ambulatory Visit: Payer: Self-pay | Admitting: Family Medicine

## 2018-11-21 NOTE — Telephone Encounter (Signed)
Requested medication (s) are due for refill today: yes  Requested medication (s) are on the active medication list: yes  Last refill:  03/13/2018  Future visit scheduled: no  Notes to clinic: review for refill   Requested Prescriptions  Pending Prescriptions Disp Refills   omeprazole (PRILOSEC) 20 MG capsule [Pharmacy Med Name: OMEPRAZOLE 20 MG CPDR 20 CAP] 90 capsule 1    Sig: Take 1 capsule (20 mg total) by mouth every morning.     Gastroenterology: Proton Pump Inhibitors Passed - 11/21/2018  2:32 PM      Passed - Valid encounter within last 12 months    Recent Outpatient Visits          11 months ago Cough   [facility], Ranell Patrick, MD   1 year ago Essential hypertension   [facility], Ranell Patrick, MD   1 year ago Other polyneuropathy   [facility], Ranell Patrick, MD   1 year ago Essential hypertension   [facility], Ranell Patrick, MD   1 year ago Essential hypertension   [facility], Ranell Patrick, MD

## 2018-11-22 ENCOUNTER — Telehealth: Payer: Self-pay | Admitting: Family Medicine

## 2018-11-22 NOTE — Telephone Encounter (Signed)
Patient is doing a TOC from [facility] and would like for courtesy refill before his appointment on the 23rd /  [facility]

## 2018-11-24 ENCOUNTER — Other Ambulatory Visit: Payer: Self-pay | Admitting: Family Medicine

## 2018-11-24 NOTE — Telephone Encounter (Signed)
Requested medication (s) are due for refill today: yes  Requested medication (s) are on the active medication list: yes  Last refill:  03/13/2018  Future visit scheduled: yes  Notes to clinic:  pcp and ordering provider are different    Requested Prescriptions  Pending Prescriptions Disp Refills   omeprazole (PRILOSEC) 20 MG capsule [Pharmacy Med Name: OMEPRAZOLE 20 MG CPDR 20 CAP] 90 capsule 1    Sig: Take 1 capsule (20 mg total) by mouth every morning.     Gastroenterology: Proton Pump Inhibitors Passed - 11/24/2018 10:36 AM      Passed - Valid encounter within last 12 months    Recent Outpatient Visits          11 months ago Cough   [facility], Ranell Patrick, MD   1 year ago Essential hypertension   [facility], Ranell Patrick, MD   1 year ago Other polyneuropathy   [facility], Ranell Patrick, MD   1 year ago Essential hypertension   [facility] Ranell Patrick, MD   1 year ago Essential hypertension   [facility], Ranell Patrick, MD      Future Appointments            In 1 week Hidalgo, Ines Bloomer, MD [facility] [place]

## 2018-11-27 MED FILL — OMEPRAZOLE 20 MG CAPSULE DR: 20 | 30 days supply | Qty: 30 | Fill #0

## 2018-11-27 NOTE — Telephone Encounter (Signed)
Rx was sent to pharmacy. 

## 2018-12-06 ENCOUNTER — Ambulatory Visit (INDEPENDENT_AMBULATORY_CARE_PROVIDER_SITE_OTHER): Payer: 59 | Admitting: Emergency Medicine

## 2018-12-06 ENCOUNTER — Other Ambulatory Visit: Payer: Self-pay

## 2018-12-06 ENCOUNTER — Encounter: Payer: Self-pay | Admitting: Emergency Medicine

## 2018-12-06 VITALS — BP 139/77 | HR 69 | Temp 98.3°F | Resp 16 | Ht 73.0 in | Wt 266.0 lb

## 2018-12-06 DIAGNOSIS — Z794 Long term (current) use of insulin: Secondary | ICD-10-CM

## 2018-12-06 DIAGNOSIS — E1169 Type 2 diabetes mellitus with other specified complication: Secondary | ICD-10-CM | POA: Diagnosis not present

## 2018-12-06 DIAGNOSIS — I10 Essential (primary) hypertension: Secondary | ICD-10-CM | POA: Diagnosis not present

## 2018-12-06 DIAGNOSIS — Z23 Encounter for immunization: Secondary | ICD-10-CM | POA: Diagnosis not present

## 2018-12-06 DIAGNOSIS — E1142 Type 2 diabetes mellitus with diabetic polyneuropathy: Secondary | ICD-10-CM | POA: Diagnosis not present

## 2018-12-06 DIAGNOSIS — Z7689 Persons encountering health services in other specified circumstances: Secondary | ICD-10-CM

## 2018-12-06 DIAGNOSIS — E785 Hyperlipidemia, unspecified: Secondary | ICD-10-CM

## 2018-12-06 DIAGNOSIS — E1159 Type 2 diabetes mellitus with other circulatory complications: Secondary | ICD-10-CM

## 2018-12-06 LAB — POCT GLYCOSYLATED HEMOGLOBIN (HGB A1C): Hemoglobin A1C: 8.1 % — AB (ref 4.0–5.6)

## 2018-12-06 LAB — GLUCOSE, POCT (MANUAL RESULT ENTRY): POC Glucose: 155 mg/dl — AB (ref 70–99)

## 2018-12-06 NOTE — Patient Instructions (Addendum)
   If you have lab work done today you will be contacted with your lab results within the next 2 weeks.  If you have not heard from us then please contact us. The fastest way to get your results is to register for My Chart.   IF you received an x-ray today, you will receive an invoice from [facility]. Please contact [facility] at [phone] with questions or concerns regarding your invoice.   IF you received labwork today, you will receive an invoice from [facility]. Please contact [facility] at [phone] with questions or concerns regarding your invoice.   Our billing staff will not be able to assist you with questions regarding bills from these companies.  You will be contacted with the lab results as soon as they are available. The fastest way to get your results is to activate your My Chart account. Instructions are located on the last page of this paperwork. If you have not heard from us regarding the results in 2 weeks, please contact this office.     Diabetes mellitus y nutricin, en adultos Diabetes Mellitus and Nutrition, Adult Si sufre de diabetes (diabetes mellitus), es muy importante tener hbitos alimenticios saludables debido a que sus niveles de azcar en la sangre (glucosa) se ven afectados en gran medida por lo que come y bebe. Comer alimentos saludables en las cantidades adecuadas, aproximadamente a la misma hora todos los das, lo ayudar a:  Controlar la glucemia.  Disminuir el riesgo de sufrir una enfermedad cardaca.  Mejorar la presin arterial.  Alcanzar o mantener un peso saludable. Todas las personas que sufren de diabetes son diferentes y cada una tiene necesidades diferentes en cuanto a un plan de alimentacin. El mdico puede recomendarle que trabaje con un especialista en dietas y nutricin (nutricionista) para elaborar el mejor plan para usted. Su plan de alimentacin puede variar segn factores como:  Las caloras que  necesita.  Los medicamentos que toma.  Su peso.  Sus niveles de glucemia, presin arterial y colesterol.  Su nivel de actividad.  Otras afecciones que tenga, como enfermedades cardacas o renales. Cmo me afectan los carbohidratos? Los carbohidratos, o hidratos de carbono, afectan su nivel de glucemia ms que cualquier otro tipo de alimento. La ingesta de carbohidratos naturalmente aumenta la cantidad de glucosa en la sangre. El recuento de carbohidratos es un mtodo destinado a llevar un registro de la cantidad de carbohidratos que se consumen. El recuento de carbohidratos es importante para mantener la glucemia a un nivel saludable, especialmente si utiliza insulina o toma determinados medicamentos por va oral para la diabetes. Es importante conocer la cantidad de carbohidratos que se pueden ingerir en cada comida sin correr ningn riesgo. Esto es diferente en cada persona. Su nutricionista puede ayudarlo a calcular la cantidad de carbohidratos que debe ingerir en cada comida y en cada refrigerio. Entre los alimentos que contienen carbohidratos, se incluyen:  Pan, cereal, arroz, pastas y galletas.  Papas y maz.  Guisantes, frijoles y lentejas.  Leche y yogur.  Frutas y jugo.  Postres, como pasteles, galletas, helado y caramelos. Cmo me afecta el alcohol? El alcohol puede provocar disminuciones sbitas de la glucemia (hipoglucemia), especialmente si utiliza insulina o toma determinados medicamentos por va oral para la diabetes. La hipoglucemia es una afeccin potencialmente mortal. Los sntomas de la hipoglucemia (somnolencia, mareos y confusin) son similares a los sntomas de haber consumido demasiado alcohol. Si el mdico afirma que el alcohol es seguro para usted, siga estas pautas:    Limite el consumo de alcohol a no ms de 1medida por da si es mujer y no est embarazada, y a 2medidas si es hombre. Una medida equivale a 12oz (355ml) de cerveza, 5oz (148ml) de vino o  1oz (44ml) de bebidas alcohlicas de alta graduacin.  No beba con el estmago vaco.  Mantngase hidratado bebiendo agua, refrescos dietticos o t helado sin azcar.  Tenga en cuenta que los refrescos comunes, los jugos y otras bebida para mezclar pueden contener mucha azcar y se deben contar como carbohidratos. Cules son algunos consejos para seguir este plan?  Leer las etiquetas de los alimentos  Comience por leer el tamao de la porcin en la "Informacin nutricional" en las etiquetas de los alimentos envasados y las bebidas. La cantidad de caloras, carbohidratos, grasas y otros nutrientes mencionados en la etiqueta se basan en una porcin del alimento. Muchos alimentos contienen ms de una porcin por envase.  Verifique la cantidad total de gramos (g) de carbohidratos totales en una porcin. Puede calcular la cantidad de porciones de carbohidratos al dividir el total de carbohidratos por 15. Por ejemplo, si un alimento tiene un total de 30g de carbohidratos, equivale a 2 porciones de carbohidratos.  Verifique la cantidad de gramos (g) de grasas saturadas y grasas trans en una porcin. Escoja alimentos que no contengan grasa o que tengan un bajo contenido.  Verifique la cantidad de miligramos (mg) de sal (sodio) en una porcin. La mayora de las personas deben limitar la ingesta de sodio total a menos de 2300mg por da.  Siempre consulte la informacin nutricional de los alimentos etiquetados como "con bajo contenido de grasa" o "sin grasa". Estos alimentos pueden tener un mayor contenido de azcar agregada o carbohidratos refinados, y deben evitarse.  Hable con su nutricionista para identificar sus objetivos diarios en cuanto a los nutrientes mencionados en la etiqueta. Al ir de compras  Evite comprar alimentos procesados, enlatados o precocinados. Estos alimentos tienden a tener una mayor cantidad de grasa, sodio y azcar agregada.  Compre en la zona exterior de la tienda de  comestibles. Esta zona incluye frutas y verduras frescas, granos a granel, carnes frescas y productos lcteos frescos. Al cocinar  Utilice mtodos de coccin a baja temperatura, como hornear, en lugar de mtodos de coccin a alta temperatura, como frer en abundante aceite.  Cocine con aceites saludables, como el aceite de oliva, canola o girasol.  Evite cocinar con manteca, crema o carnes con alto contenido de grasa. Planificacin de las comidas  Coma las comidas y los refrigerios regularmente, preferentemente a la misma hora todos los das. Evite pasar largos perodos de tiempo sin comer.  Consuma alimentos ricos en fibra, como frutas frescas, verduras, frijoles y cereales integrales. Consulte a su nutricionista sobre cuntas porciones de carbohidratos puede consumir en cada comida.  Consuma entre 4 y 6 onzas (oz) de protenas magras por da, como carnes magras, pollo, pescado, huevos o tofu. Una onza de protena magra equivale a: ? 1 onza de carne, pollo o pescado. ? 1huevo. ?  taza de tofu.  Coma algunos alimentos por da que contengan grasas saludables, como aguacates, frutos secos, semillas y pescado. Estilo de vida  Controle su nivel de glucemia con regularidad.  Haga actividad fsica habitualmente como se lo haya indicado el mdico. Esto puede incluir lo siguiente: ? 150minutos semanales de ejercicio de intensidad moderada o alta. Esto podra incluir caminatas dinmicas, ciclismo o gimnasia acutica. ? Realizar ejercicios de elongacin y de fortalecimiento, como yoga o levantamiento   de pesas, por lo menos 2veces por semana.  Tome los medicamentos como se lo haya indicado el mdico.  No consuma ningn producto que contenga nicotina o tabaco, como cigarrillos y cigarrillos electrnicos. Si necesita ayuda para dejar de fumar, consulte al mdico.  Trabaje con un asesor o instructor en diabetes para identificar estrategias para controlar el estrs y cualquier desafo emocional  y social. Preguntas para hacerle al mdico  Es necesario que consulte a un instructor en el cuidado de la diabetes?  Es necesario que me rena con un nutricionista?  A qu nmero puedo llamar si tengo preguntas?  Cules son los mejores momentos para controlar la glucemia? Dnde encontrar ms informacin:  Asociacin Estadounidense de la Diabetes (American Diabetes Association): diabetes.org  Academia de Nutricin y Diettica (Academy of Nutrition and Dietetics): www.eatright.org  Instituto Nacional de la Diabetes y las Enfermedades Digestivas y Renales (National Institute of Diabetes and Digestive and Kidney Diseases, NIH): www.niddk.nih.gov Resumen  Un plan de alimentacin saludable lo ayudar a controlar la glucemia y mantener un estilo de vida saludable.  Trabajar con un especialista en dietas y nutricin (nutricionista) puede ayudarlo a elaborar el mejor plan de alimentacin para usted.  Tenga en cuenta que los carbohidratos (hidratos de carbono) y el alcohol tienen efectos inmediatos en sus niveles de glucemia. Es importante contar los carbohidratos que ingiere y consumir alcohol con prudencia. Esta informacin no tiene como fin reemplazar el consejo del mdico. Asegrese de hacerle al mdico cualquier pregunta que tenga. Document Released: 06/08/2007 Document Revised: 11/09/2016 Document Reviewed: 06/21/2016 Elsevier Patient Education  2020 [employer]  

## 2018-12-06 NOTE — Progress Notes (Signed)
Lab Results  Component Value Date   HGBA1C 8.9 (A) 04/14/2018   BP Readings from Last 3 Encounters:  12/06/18 139/77  05/08/18 (!) 148/86  04/14/18 (!) 152/80   Lab Results  Component Value Date   CHOL 143 12/24/2017   HDL 39 (L) 12/24/2017   LDLCALC 91 12/24/2017   TRIG 66 12/24/2017   CHOLHDL 3.7 12/24/2017    James Garcia 63 y.o.   Chief Complaint  Patient presents with  . Establish Care  . Diabetes    per patient wants to check lab work    HISTORY OF PRESENT ILLNESS: This is a 63 y.o. male here to establish care with me.  Used to see James Garcia but prefers to transfer care to me for language reasons. James Garcia has the following chronic medical problems: 1.  Diabetes: Sees James Garcia regularly (endocrinologist).  Presently taking metformin, Trulicity, and detemir insulin 100 units daily.  Glucose at home between home 120 and 140. 2.  Peripheral neuropathy secondary to #1.  On gabapentin. 3.  Hypertension: On amlodipine 5 mg daily. 4.  Dyslipidemia: On atorvastatin 20 mg daily. Has no complaints or medical concerns today.  HPI   Prior to Admission medications   Medication Sig Start Date End Date Taking? Authorizing Provider  amLODipine (NORVASC) 5 MG tablet TAKE 1 TABLET (5 MG TOTAL) BY MOUTH DAILY. 02/01/18  Yes James Agreste, MD  atorvastatin (LIPITOR) 20 MG tablet TAKE 1 TABLET BY MOUTH DAILY. 02/01/18  Yes James Agreste, MD  diclofenac (VOLTAREN) 75 MG EC tablet Take 1 tablet (75 mg total) by mouth 2 (two) times daily. 05/08/18  Yes James Haber, MD  Dulaglutide (TRULICITY) 1.5 JH/4.1DE SOPN Inject 1.5 mg into the skin once a week. 04/14/18  Yes James Shin, MD  Insulin Detemir (LEVEMIR FLEXTOUCH) 100 UNIT/ML Pen Inject 100 Units into the skin every morning. And pen needles 2/day 07/11/18  Yes James Shin, MD  losartan (COZAAR) 100 MG tablet Take 1 tablet (100 mg total) by mouth daily. 11/30/17  Yes James Agreste, MD  metFORMIN (GLUCOPHAGE-XR) 500  MG 24 hr tablet Take 4 tablets (2,000 mg total) by mouth daily with breakfast. 04/14/18  Yes James Shin, MD  omeprazole (PRILOSEC) 20 MG capsule TAKE 1 CAPSULE (20 MG TOTAL) BY MOUTH EVERY MORNING. 11/27/18  Yes James Garcia, James Bloomer, MD  albuterol (PROVENTIL HFA;VENTOLIN HFA) 108 (90 Base) MCG/ACT inhaler Inhale 1-2 puffs into the lungs every 4 (four) hours as needed for wheezing or shortness of breath. Patient not taking: Reported on 12/06/2018 12/24/17   James Agreste, MD  Blood Glucose Monitoring Suppl (FREESTYLE LITE) DEVI 1 kit by Does not apply route daily. Use to test blood sugars daily 04/14/18   James Shin, MD  Blood Pressure Monitor KIT Use as instructed for HTN 07/12/17   James Agreste, MD  cyclobenzaprine (FLEXERIL) 5 MG tablet Take 1 tablet (5 mg total) by mouth at bedtime. Patient not taking: Reported on 12/06/2018 05/08/18   James Haber, MD  gabapentin (NEURONTIN) 100 MG capsule TAKE 1 CAPSULE BY MOUTH 2 TIMES A DAY Patient not taking: Reported on 12/06/2018 07/28/18   James Agreste, MD  glucose blood (FREESTYLE LITE) test strip USE TO CHECK BLOOD SUGAR 2 TIMES PER DAY. 11/02/17   James Shin, MD  HYDROcodone-acetaminophen (NORCO) 5-325 MG tablet Take 1 tablet by mouth every 6 (six) hours as needed for moderate pain. Patient not taking: Reported on 12/06/2018 05/08/18   James Haber, MD  Lancets (FREESTYLE) lancets USE TO CHECK BLOOD SUGAR 2 TIMES PER DAY. 11/02/17   James Shin, MD    No Known Allergies  Patient Active Problem List   Diagnosis Date Noted  . Diabetes ([place]) 05/01/2015  . Thrombocytopenia ([place]) 06/04/2014    Past Medical History:  Diagnosis Date  . Constipation    uses OTC laxatives - hard stools every morning- small amount and feels like doesnt empty   . Diabetes mellitus without complication ([place])   . DM type 2 (diabetes mellitus, type 2) ([place])   . Hyperlipidemia   . Hypertension   . Leg cramps   . Tuberculin skin test (TST) positive    . Tuberculosis    pt states was treated for TB    Past Surgical History:  Procedure Laterality Date  . arm surgery    . COLONOSCOPY    . ELBOW SURGERY    . FOOT SURGERY Bilateral   . POLYPECTOMY      Social History   Socioeconomic History  . Marital status: Single    Spouse name: Not on file  . Number of children: Not on file  . Years of education: Not on file  . Highest education level: Not on file  Occupational History  . Occupation: Engineer, maintenance (IT) at [employer]  . Financial resource strain: Not on file  . Food insecurity    Worry: Not on file    Inability: Not on file  . Transportation needs    Medical: Not on file    Non-medical: Not on file  Tobacco Use  . Smoking status: Former Smoker    Packs/day: 0.25    Years: 5.00    Pack years: 1.25    Types: Cigarettes    Quit date: 06/04/1978    Years since quitting: 40.5  . Smokeless tobacco: Never Used  Substance and Sexual Activity  . Alcohol use: No  . Drug use: No  . Sexual activity: Not on file  Lifestyle  . Physical activity    Days per week: Not on file    Minutes per session: Not on file  . Stress: Not on file  Relationships  . Social Herbalist on phone: Not on file    Gets together: Not on file    Attends religious service: Not on file    Active member of club or organization: Not on file    Attends meetings of clubs or organizations: Not on file    Relationship status: Not on file  . Intimate partner violence    Fear of current or ex partner: Not on file    Emotionally abused: Not on file    Physically abused: Not on file    Forced sexual activity: Not on file  Other Topics Concern  . Not on file  Social History Narrative   ** Merged History Encounter **        Family History  Problem Relation Age of Onset  . Diabetes Father   . Colon cancer Neg Hx   . Colon polyps Neg Hx   . Rectal cancer Neg Hx   . Stomach cancer Neg Hx      Review of Systems  Constitutional:  Negative.  Negative for chills and fever.  HENT: Negative.  Negative for congestion and sore throat.   Eyes: Negative.   Respiratory: Negative.  Negative for cough and shortness of breath.   Cardiovascular: Negative.  Negative for chest pain and palpitations.  Gastrointestinal: Negative.  Negative for abdominal pain, blood in stool, diarrhea, melena, nausea and vomiting.  Genitourinary: Negative.  Negative for dysuria and hematuria.  Musculoskeletal: Negative for back pain, myalgias and neck pain.  Skin: Negative.  Negative for rash.  Neurological: Negative for dizziness and headaches.  All other systems reviewed and are negative.  Vitals:   12/06/18 1018  BP: 139/77  Pulse: 69  Resp: 16  Temp: 98.3 F (36.8 C)  SpO2: 97%     Physical Exam Vitals signs reviewed.  Constitutional:      Appearance: Normal appearance. He is obese.  HENT:     Head: Normocephalic.  Eyes:     Extraocular Movements: Extraocular movements intact.     Conjunctiva/sclera: Conjunctivae normal.     Pupils: Pupils are equal, round, and reactive to light.  Neck:     Musculoskeletal: Normal range of motion and neck supple.  Cardiovascular:     Rate and Rhythm: Normal rate and regular rhythm.     Pulses: Normal pulses.     Heart sounds: Normal heart sounds.  Pulmonary:     Effort: Pulmonary effort is normal.     Breath sounds: Normal breath sounds.  Musculoskeletal: Normal range of motion.  Skin:    General: Skin is warm and dry.     Capillary Refill: Capillary refill takes less than 2 seconds.  Neurological:     General: No focal deficit present.     Mental Status: He is alert and oriented to person, place, and time.  Psychiatric:        Mood and Affect: Mood normal.        Behavior: Behavior normal.    Results for orders placed or performed in visit on 12/06/18 (from the past 24 hour(s))  POCT glucose (manual entry)     Status: Abnormal   Collection Time: 12/06/18 10:52 AM  Result Value Ref  Range   POC Glucose 155 (A) 70 - 99 mg/dl  POCT glycosylated hemoglobin (Hb A1C)     Status: Abnormal   Collection Time: 12/06/18 10:58 AM  Result Value Ref Range   Hemoglobin A1C 8.1 (A) 4.0 - 5.6 %   HbA1c POC (<> result, manual entry)     HbA1c, POC (prediabetic range)     HbA1c, POC (controlled diabetic range)      A total of 40 minutes was spent in the room with the patient, greater than 50% of which was in counseling/coordination of care regarding multiple chronic medical problems, management, review of medical records and medical history, diet and nutrition as well as weight loss goals, multiple medications, doses and side effects, cardiovascular risk associated with diabetes, hypertension, high cholesterol, prognosis, review of blood results, and need for follow-up.  ASSESSMENT & PLAN: Amontae was seen today for establish care and diabetes.  Diagnoses and all orders for this visit:  Hypertension associated with type 2 diabetes mellitus ([place])  Type 2 diabetes mellitus with diabetic polyneuropathy, with long-term current use of insulin (HCC) -     POCT glycosylated hemoglobin (Hb A1C) -     POCT glucose (manual entry) -     Microalbumin, urine -     Comprehensive metabolic panel -     CBC with Differential/Platelet -     Lipid panel  Need for prophylactic vaccination against Streptococcus pneumoniae (pneumococcus) -     Pneumococcal polysaccharide vaccine 23-valent greater than or equal to 2yo subcutaneous/IM  Need for prophylactic vaccination and inoculation against influenza -  Flu Vaccine QUAD 36+ mos IM  Encounter to establish care  Hyperlipidemia, unspecified hyperlipidemia type  Essential hypertension  Dyslipidemia associated with type 2 diabetes mellitus ([place])    Patient Instructions       If you have lab work done today you will be contacted with your lab results within the next 2 weeks.  If you have not heard from [place] then please contact us. The  fastest way to get your results is to register for My Chart.   IF you received an x-ray today, you will receive an invoice from [facility]. Please contact [facility] at [phone] with questions or concerns regarding your invoice.   IF you received labwork today, you will receive an invoice from [place]. Please contact [facility] at [phone] with questions or concerns regarding your invoice.   Our billing staff will not be able to assist you with questions regarding bills from these companies.  You will be contacted with the lab results as soon as they are available. The fastest way to get your results is to activate your My Chart account. Instructions are located on the last page of this paperwork. If you have not heard from [place] regarding the results in 2 weeks, please contact this office.     Diabetes mellitus y nutricin, en adultos Diabetes Mellitus and Nutrition, Adult Si sufre de diabetes (diabetes mellitus), es muy importante tener hbitos alimenticios saludables debido a que sus niveles de Designer, television/film set sangre (glucosa) se ven afectados en gran medida por lo que come y bebe. [place] alimentos saludables en las cantidades [place], aproximadamente a la [employer], [place] ayudar a:  Aeronautical engineer glucemia.  Disminuir el riesgo de sufrir una enfermedad cardaca.  Mejorar la presin arterial.  Science writer o mantener un peso saludable. Todas las personas que sufren de diabetes son diferentes y cada una tiene necesidades diferentes en cuanto a un plan de alimentacin. El mdico puede recomendarle que trabaje con un especialista en dietas y nutricin (nutricionista) para Financial trader plan para usted. Su plan de alimentacin puede variar segn factores como:  Las caloras que necesita.  Los medicamentos que toma.  Su peso.  Sus niveles de glucemia, presin arterial y colesterol.  Su nivel de [place].  Otras afecciones que tenga, como  enfermedades cardacas o renales. Cmo me afectan los carbohidratos? Los carbohidratos, o hidratos de carbono, afectan su nivel de glucemia ms que cualquier otro tipo de alimento. La ingesta de carbohidratos naturalmente aumenta la cantidad de [employer]. El recuento de carbohidratos es un mtodo destinado a Catering manager un registro de la cantidad de carbohidratos que se consumen. El recuento de carbohidratos es importante para Theatre manager la glucemia a un nivel saludable, especialmente si utiliza insulina o toma determinados medicamentos por va oral para la diabetes. Es importante conocer la cantidad de carbohidratos que se pueden ingerir en cada comida sin correr Engineer, manufacturing. Esto es Psychologist, forensic. Su nutricionista puede ayudarlo a calcular la cantidad de carbohidratos que debe ingerir en cada comida y en cada refrigerio. Entre los alimentos que contienen carbohidratos, se incluyen:  Pan, cereal, arroz, pastas y galletas.  Papas y maz.  Guisantes, frijoles y lentejas.  Leche y Estate agent.  Lambert Mody y [place].  Postres, como pasteles, galletas, helado y caramelos. Cmo me afecta el alcohol? El alcohol puede provocar disminuciones sbitas de la glucemia (hipoglucemia), especialmente si utiliza insulina o toma determinados medicamentos por va oral para la diabetes. La hipoglucemia es [place]  afeccin potencialmente mortal. Los sntomas de la hipoglucemia (somnolencia, mareos y confusin) son similares a los sntomas de Garcia consumido demasiado alcohol. Si el mdico afirma que el alcohol es seguro para usted, [place] estas pautas:  Limite el consumo de alcohol a no ms de 11mdida por da si es mujer y no est e[place] y a 223midas si es hombre. Una medida equivale a 12oz (35547mde cerveza, 5oz (148m44me vino o 1oz (44ml15m bebidas alcohlicas de alta graduacin.  No beba con el estmago vaco.  Mantngase hidratado bebiendo agua, refrescos dietticos o t helado sin azcar.   Tenga en cuenta que los refrescos comunes, los jugos y otras bebida para mezclOptician, dispensingen contener mucha azcar y se deben contar como carbohidratos. Cules son algunos consejos para seguir este plan?  Leer las etiquetas de los alimentos  Comience por leer el tamao de la porcin en la "Informacin nutricional" en las etiquetas de los alimentos envasados y las bebidas. La cantidad de caloras, carbohidratos, grasas y otros nutrientes mencionados en la etiqueta se basan en una porcin del alimento. Muchos alimentos contienen ms de una porcin por envase.  Verifique la cantidad total de gramos (g) de carbohidratos totales en una porcin. Puede calcular la cantidad de porciones de carbohidratos al dividir el total de carbohidratos por 15. Por ejemplo, si un alimento tiene un total de 30g de carbohidratos, equivale a 2 porciones de carbohidratos.  Verifique la cantidad de gramos (g) de grasas saturadas y grasas trans en una porcin. Escoja alimentos que no contengan grasa o que tengan un bajo contenido.  Verifique la cantidad de miligramos (mg) de sal (sodio) en una porcin. La mayorState Farmas personas deben limitar la ingesta de sodio total a menos de 2300mg 72mda.  STraining and development officermpre consulte la informacin nutricional de los alimentos etiquetados como "con bajo contenido de grasa" o "sin grasa". Estos alimentos pueden tener un mayor contenido de azcar Location managerada o carbohidratos refinados, y deben evitarse.  Hable con su nutricionista para identificar sus objetivos diarios en cuanto a los nutrientes mencionados en la etiqueta. Al ir de compras  Evite comprar alimentos procesados, enlatados o precocinados. Estos alimentos tienden a tener Special educational needs teacher cantidad de grasa,[place] y azcar agregada.  Compre en la zona exterior de la tienda de comestibles. Esta zona incluye frutas y verduras frescas, granos a granel, carnes frescas y productos lcteos frescos. Al cocinar  Utilice mtodos de coccin a baja  temperatura, como hornear, en lugar de mtodos de coccin a alta temperatura, como frer en abundante aceite.  Cocine con aceites saludables, como el aceite de oliva,Flowoodla o girasoMeridiante cocinar con manteca, crema o carnes con alto contenido de grasa. Planificacin de las comidas  Coma las comidas y los refrigerios regularmente, preferentemente a la misma hora todos los daLauderdale-by-the-[place] pasar largos perodos de tiempo sin comer.  Consuma alimentos ricos en fibra, como frutas frescas, verduras, frijoles y cereales integrales. Consulte a su nutricionista sobre cuntas porciones de carbohidratos puede consumir en cada comida.  Consuma entre 4 y 6 onzas (oz) de protenas magras por da, como carnes magrasLordshipo, pescado, huevos o tofu. Una onza de protena magra equivale a: ? 1 onza de carne, pollo o pescado. ? 1huevo. ?  taza de tofu.  Coma algunos alimentos por da que contengan grasas saludables, como aguacates, frutos secos, semillas y pescado. Estilo de vida  Controle su nivel de glucemia con regularidad.  Haga actividad fsica habitualmente como se lo haya indicado el  mdico. Esto puede incluir lo siguiente: ? 180mnutos semanales de ejercicio de intensidad moderada o alta. Esto podra incluir caminatas dinmicas, ciclismo o gimnasia acutica. ? Realizar ejercicios de elongacin y de fortalecimiento, como yoga o levantamiento de pesas, por lo menos 2veces por semana.  Tome los mTenneco Incse lo haya indicado el mdico.  No consuma ningn producto que contenga nicotina o tabaco, como cigarrillos y cPsychologist, sport and exercise Si necesita ayuda para dejar de fumar, consulte al mHess Corporationcon un asesor o instructor en diabetes para identificar estrategias para controlar el estrs y cualquier desafo emocional y social. Preguntas para hacerle al mdico  Es necesario que consulte a uRadio broadcast assistanten el cuidado de la diabetes?  Es necesario que me rena con un  nutricionista?  A qu nmero puedo llamar si tengo preguntas?  Cules son los mejores momentos para controlar la glucemia? Dnde encontrar ms informacin:  Asociacin Estadounidense de la Diabetes (American Diabetes Association): diabetes.org  Academia de Nutricin y DInformation systems manager(Academy of Nutrition and Dietetics): www.eatright.oAskovDiabetes y las Enfermedades Digestivas y Renales ([facility] Medical Centerof Diabetes and Digestive and Kidney Diseases, NIH): wDesMoinesFuneral.dkResumen  Un plan de alimentacin saludable lo ayudar a cAeronautical engineerglucemia y mTheatre managerun estilo de vida saludable.  Trabajar con un especialista en dietas y nutricin (nutricionista) puede ayudarlo a eInsurance claims handlerde alimentacin para usted.  Tenga en cuenta que los carbohidratos (hidratos de carbono) y el alcohol tienen efectos inmediatos en sus niveles de glucemia. Es importante contar los carbohidratos que ingiere y consumir alcohol con prudencia. Esta informacin no tiene cMarine scientistel consejo del mdico. Asegrese de hacerle al mdico cualquier pregunta que tenga. Document Released: 06/08/2007 Document Revised: 11/09/2016 Document Reviewed: 06/21/2016 Elsevier Patient Education  2020 Elsevier Inc.      MAgustina Caroli MD Urgent MChino ValleyGroup

## 2018-12-07 ENCOUNTER — Ambulatory Visit (INDEPENDENT_AMBULATORY_CARE_PROVIDER_SITE_OTHER): Payer: 59 | Admitting: Endocrinology

## 2018-12-07 ENCOUNTER — Encounter: Payer: Self-pay | Admitting: Endocrinology

## 2018-12-07 VITALS — BP 142/70 | HR 80 | Ht 73.0 in | Wt 269.2 lb

## 2018-12-07 DIAGNOSIS — E119 Type 2 diabetes mellitus without complications: Secondary | ICD-10-CM | POA: Diagnosis not present

## 2018-12-07 DIAGNOSIS — Z794 Long term (current) use of insulin: Secondary | ICD-10-CM

## 2018-12-07 LAB — COMPREHENSIVE METABOLIC PANEL
ALT: 28 IU/L (ref 0–44)
AST: 14 IU/L (ref 0–40)
Albumin/Globulin Ratio: 1.9 (ref 1.2–2.2)
Albumin: 4.4 g/dL (ref 3.8–4.8)
Alkaline Phosphatase: 108 IU/L (ref 39–117)
BUN/Creatinine Ratio: 13 (ref 10–24)
BUN: 12 mg/dL (ref 8–27)
Bilirubin Total: 0.7 mg/dL (ref 0.0–1.2)
CO2: 26 mmol/L (ref 20–29)
Calcium: 9.8 mg/dL (ref 8.6–10.2)
Chloride: 100 mmol/L (ref 96–106)
Creatinine, Ser: 0.9 mg/dL (ref 0.76–1.27)
GFR calc Af Amer: 105 mL/min/{1.73_m2} (ref >59)
GFR calc non Af Amer: 91 mL/min/{1.73_m2} (ref >59)
Globulin, Total: 2.3 g/dL (ref 1.5–4.5)
Glucose: 155 mg/dL — ABNORMAL HIGH (ref 65–99)
Potassium: 4.9 mmol/L (ref 3.5–5.2)
Sodium: 139 mmol/L (ref 134–144)
Total Protein: 6.7 g/dL (ref 6.0–8.5)

## 2018-12-07 LAB — CBC WITH DIFFERENTIAL/PLATELET
Basophils Absolute: 0.1 10*3/uL (ref 0.0–0.2)
Basos: 1 %
EOS (ABSOLUTE): 0.3 10*3/uL (ref 0.0–0.4)
Eos: 4 %
Hematocrit: 46.8 % (ref 37.5–51.0)
Hemoglobin: 16.1 g/dL (ref 13.0–17.7)
Immature Grans (Abs): 0 10*3/uL (ref 0.0–0.1)
Immature Granulocytes: 0 %
Lymphocytes Absolute: 2.1 10*3/uL (ref 0.7–3.1)
Lymphs: 29 %
MCH: 29.8 pg (ref 26.6–33.0)
MCHC: 34.4 g/dL (ref 31.5–35.7)
MCV: 87 fL (ref 79–97)
Monocytes Absolute: 0.5 10*3/uL (ref 0.1–0.9)
Monocytes: 7 %
Neutrophils Absolute: 4.2 10*3/uL (ref 1.4–7.0)
Neutrophils: 59 %
Platelets: 148 10*3/uL — ABNORMAL LOW (ref 150–450)
RBC: 5.41 x10E6/uL (ref 4.14–5.80)
RDW: 12.9 % (ref 11.6–15.4)
WBC: 7.1 10*3/uL (ref 3.4–10.8)

## 2018-12-07 LAB — LIPID PANEL
Chol/HDL Ratio: 3 ratio (ref 0.0–5.0)
Cholesterol, Total: 153 mg/dL (ref 100–199)
HDL: 51 mg/dL (ref >39)
LDL Chol Calc (NIH): 88 mg/dL (ref 0–99)
Triglycerides: 73 mg/dL (ref 0–149)
VLDL Cholesterol Cal: 14 mg/dL (ref 5–40)

## 2018-12-07 LAB — MICROALBUMIN, URINE: Microalbumin, Urine: 4 ug/mL

## 2018-12-07 MED ORDER — LEVEMIR FLEXTOUCH 100 UNIT/ML ~~LOC~~ SOPN: 105.0000 [IU] | PEN_INJECTOR | SUBCUTANEOUS | 3 refills | Status: DC

## 2018-12-07 NOTE — Patient Instructions (Addendum)
Your blood pressure is high today.  Please see your primary care provider soon, to have it rechecked Please increase the Levemir to 105 units each morning.  I have sent a prescription to your pharmacy. check your blood sugar twice a day.  vary the time of day when you check, between before the 3 meals, and at bedtime.  also check if you have symptoms of your blood sugar being too high or too low.  please keep a record of the readings and bring it to your next appointment here (or you can bring the meter itself).  You can write it on any piece of paper.  please call us sooner if your blood sugar goes below 70, or if you have a lot of readings over 200. Please come back for a follow-up appointment in 2 months.

## 2018-12-07 NOTE — Progress Notes (Signed)
Subjective:    Patient ID: James Garcia, male    DOB: May 05, 1955, 63 y.o.   MRN: [id number]  HPI Pt returns for f/u of diabetes mellitus: DM type: Insulin-requiring type 2 Dx'ed: 8144 Complications: polyneuropathy and DR.   Therapy: insulin since 8185, Trulicity, and metformin.   DKA: never Severe hypoglycemia: never.  Pancreatitis: never.   Other: he declines multiple daily injections; he works 2nd shift. QD insulin was changed to levemir, due to pattern of cbg's; He stopped farxiga, due to itching.   Interval history: Pt says he never misses the insulin.  He brings his meter with his cbg's which I have reviewed today.  cbg's vary from 70-324. It is in general lowest after work. No recent steroids.  Past Medical History:  Diagnosis Date  . Constipation    uses OTC laxatives - hard stools every morning- small amount and feels like doesnt empty   . Diabetes mellitus without complication ([place])   . DM type 2 (diabetes mellitus, type 2) ([place])   . Hyperlipidemia   . Hypertension   . Leg cramps   . 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 Tuberculosis    pt states was treated for TB    Past Surgical History:  Procedure Laterality Date  . arm surgery    . COLONOSCOPY    . ELBOW SURGERY    . FOOT SURGERY Bilateral   . POLYPECTOMY      Social History   Socioeconomic History  . Marital status: Single    Spouse name: Not on file  . Number of children: Not on file  . Years of education: Not on file  . Highest education level: Not on file  Occupational History  . Occupation: Engineer, maintenance (IT) at [employer]  . Financial resource strain: Not on file  . Food insecurity    Worry: Not on file    Inability: Not on file  . Transportation needs    Medical: Not on file    Non-medical: Not on file  Tobacco Use  . Smoking status: Former Smoker    Packs/day: 0.25    Years: 5.00    Pack years: 1.25    Types: Cigarettes    Quit date: 06/04/1978    Years since quitting: 40.5  .  Smokeless tobacco: Never Used  Substance and Sexual Activity  . Alcohol use: No  . Drug use: No  . Sexual activity: Not on file  Lifestyle  . Physical activity    Days per week: Not on file    Minutes per session: Not on file  . Stress: Not on file  Relationships  . Social Herbalist on phone: Not on file    Gets together: Not on file    Attends religious service: Not on file    Active member of club or organization: Not on file    Attends meetings of clubs or organizations: Not on file    Relationship status: Not on file  . Intimate partner violence    Fear of current or ex partner: Not on file    Emotionally abused: Not on file    Physically abused: Not on file    Forced sexual activity: Not on file  Other Topics Concern  . Not on file  Social History Narrative   ** Merged History Encounter **        Current Outpatient Medications on File Prior to Visit  Medication Sig Dispense Refill  .  albuterol (PROVENTIL HFA;VENTOLIN HFA) 108 (90 Base) MCG/ACT inhaler Inhale 1-2 puffs into the lungs every 4 (four) hours as needed for wheezing or shortness of breath. 1 Inhaler 0  . amLODipine (NORVASC) 5 MG tablet TAKE 1 TABLET (5 MG TOTAL) BY MOUTH DAILY. 90 tablet 1  . atorvastatin (LIPITOR) 20 MG tablet TAKE 1 TABLET BY MOUTH DAILY. 90 tablet 1  . Blood Glucose Monitoring Suppl (FREESTYLE LITE) DEVI 1 kit by Does not apply route daily. Use to test blood sugars daily 1 each 0  . Blood Pressure Monitor KIT Use as instructed for HTN 1 each 0  . cyclobenzaprine (FLEXERIL) 5 MG tablet Take 1 tablet (5 mg total) by mouth at bedtime. 7 tablet 0  . diclofenac (VOLTAREN) 75 MG EC tablet Take 1 tablet (75 mg total) by mouth 2 (two) times daily. 14 tablet 0  . Dulaglutide (TRULICITY) 1.5 SN/0.5LZ SOPN Inject 1.5 mg into the skin once a week. 4 pen 11  . gabapentin (NEURONTIN) 100 MG capsule TAKE 1 CAPSULE BY MOUTH 2 TIMES A DAY 180 capsule 1  . glucose blood (FREESTYLE LITE) test strip  USE TO CHECK BLOOD SUGAR 2 TIMES PER DAY. 200 each 2  . HYDROcodone-acetaminophen (NORCO) 5-325 MG tablet Take 1 tablet by mouth every 6 (six) hours as needed for moderate pain. 12 tablet 0  . Lancets (FREESTYLE) lancets USE TO CHECK BLOOD SUGAR 2 TIMES PER DAY. 200 each 2  . losartan (COZAAR) 100 MG tablet Take 1 tablet (100 mg total) by mouth daily. 90 tablet 0  . metFORMIN (GLUCOPHAGE-XR) 500 MG 24 hr tablet Take 4 tablets (2,000 mg total) by mouth daily with breakfast. 360 tablet 3  . omeprazole (PRILOSEC) 20 MG capsule TAKE 1 CAPSULE (20 MG TOTAL) BY MOUTH EVERY MORNING. 30 capsule 0   Current Facility-Administered Medications on File Prior to Visit  Medication Dose Route Frequency Provider Last Rate Last Dose  . 0.9 %  sodium chloride infusion  500 mL Intravenous Once Pyrtle, Lajuan Lines, MD        No Known Allergies  Family History  Problem Relation Age of Onset  . Diabetes Father   . Colon cancer Neg Hx   . Colon polyps Neg Hx   . Rectal cancer Neg Hx   . Stomach cancer Neg Hx     BP (!) 142/70 (BP Location: Left Arm, Patient Position: Sitting, Cuff Size: Large)   Pulse 80   Ht '6\' 1"'  (1.854 m)   Wt 269 lb 3.2 oz (122.1 kg)   SpO2 96%   BMI 35.52 kg/m    Review of Systems Denies LOC    Objective:   Physical Exam VITAL SIGNS:  See vs page GENERAL: no distress Pulses: dorsalis pedis intact bilat.   MSK: no deformity of the feet CV: 1+ bilat leg edema, and bilat vv's.  Skin:  no ulcer on the feet.  normal color and temp on the feet. Neuro: sensation is intact to touch on the feet, but decreased from normal Ext: There is bilateral onychomycosis of the toenails.    Lab Results  Component Value Date   HGBA1C 8.1 (A) 12/06/2018       Assessment & Plan:  HTN: is noted today Insulin-requiring type 2 DM, with DR: he needs increased rx.  Hypoglycemia: this limits aggressiveness of glycemic control, so we'll have to increase insulin just slightly.     Patient  Instructions  Your blood pressure is high today.  Please see your  primary care provider soon, to have it rechecked Please increase the Levemir to 105 units each morning.  I have sent a prescription to your pharmacy. check your blood sugar twice a day.  vary the time of day when you check, between before the 3 meals, and at bedtime.  also check if you have symptoms of your blood sugar being too high or too low.  please keep a record of the readings and bring it to your next appointment here (or you can bring the meter itself).  You can write it on any piece of paper.  please call us sooner if your blood sugar goes below 70, or if you have a lot of readings over 200. Please come back for a follow-up appointment in 2 months.

## 2018-12-08 ENCOUNTER — Telehealth: Payer: Self-pay | Admitting: Emergency Medicine

## 2018-12-08 MED FILL — TRULICITY 1.5 MG/0.5 ML PEN: 1.5 | 28 days supply | Qty: 2 | Fill #2

## 2018-12-08 NOTE — Telephone Encounter (Signed)
Pt called back after missed call from office to review lab results with provider. Pt request a call to his mobile phone.

## 2018-12-12 ENCOUNTER — Encounter: Payer: Self-pay | Admitting: Radiology

## 2018-12-12 NOTE — Telephone Encounter (Signed)
Unable to reach patient. A letter was sent with lab results

## 2018-12-13 ENCOUNTER — Other Ambulatory Visit: Payer: Self-pay | Admitting: Family Medicine

## 2018-12-13 DIAGNOSIS — I10 Essential (primary) hypertension: Secondary | ICD-10-CM

## 2018-12-13 DIAGNOSIS — E785 Hyperlipidemia, unspecified: Secondary | ICD-10-CM

## 2018-12-13 MED FILL — ATORVASTATIN 20 MG TABLET: 20 | 90 days supply | Qty: 90 | Fill #0

## 2018-12-13 MED FILL — AMLODIPINE BESYLATE 5 MG TA: 5 | 90 days supply | Qty: 90 | Fill #0

## 2018-12-13 NOTE — Telephone Encounter (Signed)
Requested medication (s) are due for refill today: yes  Requested medication (s) are on the active medication list: yes  Last refill:  09/12/2018  Future visit scheduled: yes  Notes to clinic:  Ordering provider and pcp are different    Requested Prescriptions  Pending Prescriptions Disp Refills   amLODipine (NORVASC) 5 MG tablet [Pharmacy Med Name: AMLODIPINE BESYLATE 5 MG TA 5 TAB] 90 tablet 1    Sig: TAKE 1 TABLET BY MOUTH DAILY.     Cardiovascular:  Calcium Channel Blockers Failed - 12/13/2018 12:48 PM      Failed - Last BP in normal range    BP Readings from Last 1 Encounters:  12/07/18 (!) 142/70         Passed - Valid encounter within last 6 months    Recent Outpatient Visits          1 week ago Hypertension associated with type 2 diabetes mellitus [facility])   [facility] at [facility], Ines Bloomer, MD   11 months ago Cough   [facility] at [facility], Ranell Patrick, MD   1 year ago Essential hypertension   [facility] at [facility], Ranell Patrick, MD   1 year ago Other polyneuropathy   [facility] at [facility], Ranell Patrick, MD   1 year ago Essential hypertension   Primary Care at [place], MD      Future Appointments            In 2 months Sagardia, Ines Bloomer, MD Primary Care at [place], [place]            atorvastatin (LIPITOR) 20 MG tablet [Pharmacy Med Name: ATORVASTATIN 20 MG TABLET 20 TAB] 90 tablet 1    Sig: TAKE 1 TABLET BY MOUTH DAILY.     Cardiovascular:  Antilipid - Statins Passed - 12/13/2018 12:48 PM      Passed - Total Cholesterol in normal range and within 360 days    Cholesterol, Total  Date Value Ref Range Status  12/06/2018 153 100 - 199 mg/dL Final         Passed - LDL in normal range and within 360 days    LDL Chol Calc (NIH)  Date Value Ref Range Status  12/06/2018 88 0 - 99 mg/dL Final         Passed - HDL in normal range and within 360 days    HDL  Date Value Ref Range Status  12/06/2018 51 >39 mg/dL Final          Passed - Triglycerides in normal range and within 360 days    Triglycerides  Date Value Ref Range Status  12/06/2018 73 0 - 149 mg/dL Final         Passed - Patient is not pregnant      Passed - Valid encounter within last 12 months    Recent Outpatient Visits          1 week ago Hypertension associated with type 2 diabetes mellitus [facility])   [facility] at [facility], Ines Bloomer, MD   11 months ago Cough   [facility] at [facility], Ranell Patrick, MD   1 year ago Essential hypertension   [facility] at [facility], Ranell Patrick, MD   1 year ago Other polyneuropathy   [facility] at [facility], Ranell Patrick, MD   1 year ago Essential hypertension   [facility] at [facility], Ranell Patrick, MD  Future Appointments            In 2 months Sagardia, Ines Bloomer, MD Primary Care at [place], [facility]

## 2018-12-18 ENCOUNTER — Other Ambulatory Visit: Payer: Self-pay | Admitting: Family Medicine

## 2018-12-18 DIAGNOSIS — I10 Essential (primary) hypertension: Secondary | ICD-10-CM

## 2018-12-22 ENCOUNTER — Other Ambulatory Visit: Payer: Self-pay | Admitting: Endocrinology

## 2018-12-22 MED FILL — FREESTYLE LITE TEST STRIP: 90 days supply | Qty: 200 | Fill #0

## 2019-01-13 MED FILL — TRULICITY 1.5 MG/0.5 ML PEN: 1.5 | 28 days supply | Qty: 2 | Fill #0

## 2019-01-18 MED FILL — METFORMIN HCL ER 500 MG TB2: 500 | 90 days supply | Qty: 360 | Fill #1

## 2019-01-19 MED FILL — LEVEMIR FLEXTOUCH 100 UNITS: 100 | 88 days supply | Qty: 93 | Fill #0

## 2019-01-30 ENCOUNTER — Other Ambulatory Visit: Payer: Self-pay

## 2019-02-01 ENCOUNTER — Encounter: Payer: Self-pay | Admitting: Endocrinology

## 2019-02-01 ENCOUNTER — Ambulatory Visit (INDEPENDENT_AMBULATORY_CARE_PROVIDER_SITE_OTHER): Payer: 59 | Admitting: Endocrinology

## 2019-02-01 ENCOUNTER — Other Ambulatory Visit: Payer: Self-pay

## 2019-02-01 VITALS — BP 136/62 | HR 72 | Ht 73.0 in | Wt 268.8 lb

## 2019-02-01 DIAGNOSIS — E119 Type 2 diabetes mellitus without complications: Secondary | ICD-10-CM

## 2019-02-01 DIAGNOSIS — Z794 Long term (current) use of insulin: Secondary | ICD-10-CM | POA: Diagnosis not present

## 2019-02-01 LAB — POCT GLYCOSYLATED HEMOGLOBIN (HGB A1C): Hemoglobin A1C: 7.4 % — AB (ref 4.0–5.6)

## 2019-02-01 MED ORDER — LEVEMIR FLEXTOUCH 100 UNIT/ML ~~LOC~~ SOPN: 90.0000 [IU] | PEN_INJECTOR | SUBCUTANEOUS | 3 refills | Status: DC

## 2019-02-01 MED ORDER — TRULICITY 3 MG/0.5ML ~~LOC~~ SOAJ: 3.0000 mg | SUBCUTANEOUS | 3 refills | Status: DC

## 2019-02-01 MED FILL — TRULICITY 3 MG/0.5ML SOPN: 3 | 84 days supply | Qty: 6 | Fill #0

## 2019-02-01 NOTE — Progress Notes (Signed)
 Subjective:    Patient ID: James Garcia, male    DOB: 08/27/1955, 63 y.o.   MRN: [id number]  HPI Pt returns for f/u of diabetes mellitus: DM type: Insulin-requiring type 2 Dx'ed: 2007 Complications: polyneuropathy and DR.   Therapy: insulin since 2016, Trulicity, and metformin.   DKA: never Severe hypoglycemia: never.  Pancreatitis: never.   Other: he declines multiple daily injections; he works 2nd shift. QD insulin was changed to levemir, due to pattern of cbg's; He stopped farxiga, due to itching.   Interval history: Pt says he never misses the insulin.  He brings his meter with his cbg's which I have reviewed today.  cbg's vary from 90-324.  There is no trend throughout the day. No recent steroids.  Past Medical History:  Diagnosis Date  . Constipation    uses OTC laxatives - hard stools every morning- small amount and feels like doesnt empty   . Diabetes mellitus without complication ([facility])   . DM type 2 (diabetes mellitus, type 2) ([facility])   . Hyperlipidemia   . Hypertension   . Leg cramps   . 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 Tuberculosis    pt states was treated for TB    Past Surgical History:  Procedure Laterality Date  . arm surgery    . COLONOSCOPY    . ELBOW SURGERY    . FOOT SURGERY Bilateral   . POLYPECTOMY      Social History   Socioeconomic History  . Marital status: Single    Spouse name: Not on file  . Number of children: Not on file  . Years of education: Not on file  . Highest education level: Not on file  Occupational History  . Occupation: Floor Tech at [employer]   Social Needs  . Financial resource strain: Not on file  . Food insecurity    Worry: Not on file    Inability: Not on file  . Transportation needs    Medical: Not on file    Non-medical: Not on file  Tobacco Use  . Smoking status: Former Smoker    Packs/day: 0.25    Years: 5.00    Pack years: 1.25    Types: Cigarettes    Quit date: 06/04/1978    Years since quitting: 40.6   . Smokeless tobacco: Never Used  Substance and Sexual Activity  . Alcohol use: No  . Drug use: No  . Sexual activity: Not on file  Lifestyle  . Physical activity    Days per week: Not on file    Minutes per session: Not on file  . Stress: Not on file  Relationships  . Social connections    Talks on phone: Not on file    Gets together: Not on file    Attends religious service: Not on file    Active member of club or organization: Not on file    Attends meetings of clubs or organizations: Not on file    Relationship status: Not on file  . Intimate partner violence    Fear of current or ex partner: Not on file    Emotionally abused: Not on file    Physically abused: Not on file    Forced sexual activity: Not on file  Other Topics Concern  . Not on file  Social History Narrative   ** Merged History Encounter **        Current Outpatient Medications on File Prior to Visit  Medication Sig Dispense   Refill  . albuterol (PROVENTIL HFA;VENTOLIN HFA) 108 (90 Base) MCG/ACT inhaler Inhale 1-2 puffs into the lungs every 4 (four) hours as needed for wheezing or shortness of breath. 1 Inhaler 0  . amLODipine (NORVASC) 5 MG tablet TAKE 1 TABLET BY MOUTH DAILY. 90 tablet 1  . atorvastatin (LIPITOR) 20 MG tablet TAKE 1 TABLET BY MOUTH DAILY. 90 tablet 1  . Blood Glucose Monitoring Suppl (FREESTYLE LITE) DEVI 1 kit by Does not apply route daily. Use to test blood sugars daily 1 each 0  . Blood Pressure Monitor KIT Use as instructed for HTN 1 each 0  . cyclobenzaprine (FLEXERIL) 5 MG tablet Take 1 tablet (5 mg total) by mouth at bedtime. 7 tablet 0  . diclofenac (VOLTAREN) 75 MG EC tablet Take 1 tablet (75 mg total) by mouth 2 (two) times daily. 14 tablet 0  . gabapentin (NEURONTIN) 100 MG capsule TAKE 1 CAPSULE BY MOUTH 2 TIMES A DAY 180 capsule 1  . glucose blood (FREESTYLE LITE) test strip USE TO CHECK BLOOD SUGAR 2 TIMES PER DAY. 200 strip 0  . Lancets (FREESTYLE) lancets USE TO CHECK BLOOD  SUGAR 2 TIMES PER DAY. 200 each 2  . losartan (COZAAR) 100 MG tablet Take 1 tablet (100 mg total) by mouth daily. 90 tablet 0  . metFORMIN (GLUCOPHAGE-XR) 500 MG 24 hr tablet Take 4 tablets (2,000 mg total) by mouth daily with breakfast. 360 tablet 3  . omeprazole (PRILOSEC) 20 MG capsule TAKE 1 CAPSULE (20 MG TOTAL) BY MOUTH EVERY MORNING. 30 capsule 0   Current Facility-Administered Medications on File Prior to Visit  Medication Dose Route Frequency Provider Last Rate Last Dose  . 0.9 %  sodium chloride infusion  500 mL Intravenous Once Pyrtle, Lajuan Lines, MD        No Known Allergies  Family History  Problem Relation Age of Onset  . Diabetes Father   . Colon cancer Neg Hx   . Colon polyps Neg Hx   . Rectal cancer Neg Hx   . Stomach cancer Neg Hx     BP 136/62 (BP Location: Right Arm, Patient Position: Sitting, Cuff Size: Large)   Pulse 72   Ht 6' 1" (1.854 m)   Wt 268 lb 12.8 oz (121.9 kg)   SpO2 96%   BMI 35.46 kg/m   Review of Systems He denies hypoglycemia    Objective:   Physical Exam VITAL SIGNS:  See vs page GENERAL: no distress Pulses: dorsalis pedis intact bilat.   MSK: no deformity of the feet CV: trace bilat leg edema Skin:  no ulcer on the feet.  normal color and temp on the feet.  Neuro: sensation is intact to touch on the feet, but decreased from normal  Lab Results  Component Value Date   HGBA1C 7.4 (A) 02/01/2019   Lab Results  Component Value Date   CREATININE 0.90 12/06/2018   BUN 12 12/06/2018   NA 139 12/06/2018   K 4.9 12/06/2018   CL 100 12/06/2018   CO2 26 12/06/2018        Assessment & Plan:  Insulin-requiring type 2 DM: he would benefit from increased rx, if it can be done with a regimen that avoids or minimizes hypoglycemia.   Patient Instructions  Please increase the trulicity to 3 mg weekly, and reduce the levemir to 90 units each morning. check your blood sugar twice a day.  vary the time of day when you check, between before  the 3  meals, and at bedtime.  also check if you have symptoms of your blood sugar being too high or too low.  please keep a record of the readings and bring it to your next appointment here (or you can bring the meter itself).  You can write it on any piece of paper.  please call us sooner if your blood sugar goes below 70, or if you have a lot of readings over 200. Please come back for a follow-up appointment in 2 months.     Aumente la trulicidad a 3 mg por semana y reduzca el levemir a 90 unidades cada maana. controle su nivel de azcar en sangre dos veces al da. Vare la hora del da en que lo comprueba, entre antes de las 3 comidas y antes de acostarse. Compruebe tambin si tiene sntomas de que su nivel de azcar en sangre es demasiado alto o demasiado bajo. por favor mantenga un registro de las lecturas y trigalo a su prxima cita aqu (o puede traer el medidor). Puedes escribirlo en cualquier hoja de papel. Llmenos antes si su nivel de azcar en sangre es inferior a 70 o si tiene muchas lecturas superiores a 200. Regrese para una cita de seguimiento en 2 meses.       

## 2019-02-01 NOTE — Patient Instructions (Addendum)
Please increase the trulicity to 3 mg weekly, and reduce the levemir to 90 units each morning. check your blood sugar twice a day.  vary the time of day when you check, between before the 3 meals, and at bedtime.  also check if you have symptoms of your blood sugar being too high or too low.  please keep a record of the readings and bring it to your next appointment here (or you can bring the meter itself).  You can write it on any piece of paper.  please call us sooner if your blood sugar goes below 70, or if you have a lot of readings over 200. Please come back for a follow-up appointment in 2 months.     Aumente la trulicidad a 3 mg por semana y Radiation protection practitioner a 84 unidades cada maana. controle su nivel de azcar en [place]. Vare la hora del da en que lo comprueba, entre antes de las 3 comidas y antes de [place]. Compruebe tambin si tiene sntomas de que su nivel de Location manager en sangre es demasiado alto o demasiado bajo. por favor mantenga un registro de las lecturas y Air cabin crew a su prxima cita aqu (o puede traer Nature conservation officer). Puedes escribirlo en cualquier hoja de papel. Llmenos antes si su nivel de azcar en sangre es inferior a 55 o si tiene muchas lecturas superiores a 200. Regrese para una cita de seguimiento en 2 meses.

## 2019-02-12 ENCOUNTER — Other Ambulatory Visit: Payer: Self-pay | Admitting: Family Medicine

## 2019-02-12 DIAGNOSIS — G6289 Other specified polyneuropathies: Secondary | ICD-10-CM

## 2019-02-12 MED FILL — GABAPENTIN 100 MG CAPSULE: 100 | 90 days supply | Qty: 180 | Fill #0

## 2019-02-19 MED FILL — UNIFINE PENTIPS 8MM 31G: 31G X 8 MM | 50 days supply | Qty: 100 | Fill #1

## 2019-03-06 ENCOUNTER — Encounter: Payer: Self-pay | Admitting: Emergency Medicine

## 2019-03-06 ENCOUNTER — Ambulatory Visit (INDEPENDENT_AMBULATORY_CARE_PROVIDER_SITE_OTHER): Payer: 59 | Admitting: Emergency Medicine

## 2019-03-06 ENCOUNTER — Other Ambulatory Visit: Payer: Self-pay

## 2019-03-06 VITALS — BP 160/80 | HR 71 | Temp 98.2°F | Resp 16 | Ht 73.0 in | Wt 263.0 lb

## 2019-03-06 DIAGNOSIS — E1169 Type 2 diabetes mellitus with other specified complication: Secondary | ICD-10-CM

## 2019-03-06 DIAGNOSIS — E1159 Type 2 diabetes mellitus with other circulatory complications: Secondary | ICD-10-CM

## 2019-03-06 DIAGNOSIS — I152 Hypertension secondary to endocrine disorders: Secondary | ICD-10-CM

## 2019-03-06 DIAGNOSIS — E785 Hyperlipidemia, unspecified: Secondary | ICD-10-CM | POA: Diagnosis not present

## 2019-03-06 DIAGNOSIS — I10 Essential (primary) hypertension: Secondary | ICD-10-CM | POA: Diagnosis not present

## 2019-03-06 NOTE — Progress Notes (Signed)
[facility] visit on 02/01/2019 Assessment & Plan:  Insulin-requiring type 2 DM: he would benefit from increased rx, if it can be done with a regimen that avoids or minimizes hypoglycemia.   Patient Instructions  Please increase the trulicity to 3 mg weekly, and reduce the levemir to 90 units each morning. check your blood sugar twice a day.  vary the time of day when you check, between before the 3 meals, and at bedtime.  also check if you have symptoms of your blood sugar being too high or too low.  please keep a record of the readings and bring it to your next appointment here (or you can bring the meter itself).  You can write it on any piece of paper.  please call us sooner if your blood sugar goes below 70, or if you have a lot of readings over 200. Please come back for a follow-up appointment in 2 months.    James Garcia 63 y.o.   Chief Complaint  Patient presents with  . Diabetes    3 month follow up     HISTORY OF PRESENT ILLNESS: This is a 63 y.o. male here for follow-up of chronic medical conditions including diabetes and hypertension.  Doing well.  Has no complaints or medical concerns. Saw Dr. Loanne Drilling, endocrinologist, last month.  Office visit notes reviewed.  See above.  Had improved hemoglobin A1c at 7.4. Lab Results  Component Value Date   HGBA1C 7.4 (A) 02/01/2019    Most recent blood work reviewed with patient.  Normal renal function and normal lipid profile.  Normal electrolytes and LFTs.  Normal CBC.  HPI   Prior to Admission medications   Medication Sig Start Date End Date Taking? Authorizing Provider  amLODipine (NORVASC) 5 MG tablet TAKE 1 TABLET BY MOUTH DAILY. 12/13/18  Yes Amorie Rentz, Ines Bloomer, MD  atorvastatin (LIPITOR) 20 MG tablet TAKE 1 TABLET BY MOUTH DAILY. 12/13/18  Yes Yovanny Coats, Ines Bloomer, MD  Dulaglutide (TRULICITY) 3 AT/5.5DD SOPN Inject 3 mg into the skin once a week. 02/01/19  Yes Renato Shin, MD  gabapentin (NEURONTIN) 100  MG capsule TAKE 1 CAPSULE BY MOUTH 2 TIMES A DAY 02/12/19  Yes Kobe Jansma, Ines Bloomer, MD  Insulin Detemir (LEVEMIR FLEXTOUCH) 100 UNIT/ML Pen Inject 90 Units into the skin every morning. And pen needles 2/day 02/01/19  Yes Renato Shin, MD  losartan (COZAAR) 100 MG tablet Take 1 tablet (100 mg total) by mouth daily. 11/30/17  Yes Wendie Agreste, MD  metFORMIN (GLUCOPHAGE-XR) 500 MG 24 hr tablet Take 4 tablets (2,000 mg total) by mouth daily with breakfast. 04/14/18  Yes Renato Shin, MD  omeprazole (PRILOSEC) 20 MG capsule TAKE 1 CAPSULE (20 MG TOTAL) BY MOUTH EVERY MORNING. 11/27/18  Yes Katye Valek, Ines Bloomer, MD  albuterol (PROVENTIL HFA;VENTOLIN HFA) 108 (90 Base) MCG/ACT inhaler Inhale 1-2 puffs into the lungs every 4 (four) hours as needed for wheezing or shortness of breath. Patient not taking: Reported on 03/06/2019 12/24/17   Wendie Agreste, MD  Blood Glucose Monitoring Suppl (FREESTYLE LITE) DEVI 1 kit by Does not apply route daily. Use to test blood sugars daily 04/14/18   Renato Shin, MD  Blood Pressure Monitor KIT Use as instructed for HTN 07/12/17   Wendie Agreste, MD  cyclobenzaprine (FLEXERIL) 5 MG tablet Take 1 tablet (5 mg total) by mouth at bedtime. Patient not taking: Reported on 03/06/2019 05/08/18   Robyn Haber, MD  diclofenac (VOLTAREN) 75 MG EC tablet Take 1  tablet (75 mg total) by mouth 2 (two) times daily. Patient not taking: Reported on 03/06/2019 05/08/18   Robyn Haber, MD  glucose blood (FREESTYLE LITE) test strip USE TO CHECK BLOOD SUGAR 2 TIMES PER DAY. 12/22/18   Renato Shin, MD  Lancets (FREESTYLE) lancets USE TO CHECK BLOOD SUGAR 2 TIMES PER DAY. 11/02/17   Renato Shin, MD    No Known Allergies  Patient Active Problem List   Diagnosis Date Noted  . Diabetes ([place]) 05/01/2015  . Thrombocytopenia ([place]) 06/04/2014    Past Medical History:  Diagnosis Date  . Constipation    uses OTC laxatives - hard stools every morning- small amount and feels  like doesnt empty   . Diabetes mellitus without complication ([place])   . DM type 2 (diabetes mellitus, type 2) ([place])   . Hyperlipidemia   . Hypertension   . Leg cramps   . 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 Tuberculosis    pt states was treated for TB    Past Surgical History:  Procedure Laterality Date  . arm surgery    . COLONOSCOPY    . ELBOW SURGERY    . FOOT SURGERY Bilateral   . POLYPECTOMY      Social History   Socioeconomic History  . Marital status: Single    Spouse name: Not on file  . Number of children: Not on file  . Years of education: Not on file  . Highest education level: Not on file  Occupational History  . Occupation: Engineer, maintenance (IT) at [employer]  . Smoking status: Former Smoker    Packs/day: 0.25    Years: 5.00    Pack years: 1.25    Types: Cigarettes    Quit date: 06/04/1978    Years since quitting: 40.7  . Smokeless tobacco: Never Used  Substance and Sexual Activity  . Alcohol use: No  . Drug use: No  . Sexual activity: Not on file  Other Topics Concern  . Not on file  Social History Narrative   ** Merged History Encounter **       Social Determinants of Health   Financial Resource Strain:   . Difficulty of Paying Living Expenses: Not on file  Food Insecurity:   . Worried About Charity fundraiser in the Last Year: Not on file  . Ran Out of Food in the Last Year: Not on file  Transportation Needs:   . Lack of Transportation (Medical): Not on file  . Lack of Transportation (Non-Medical): Not on file  Physical Activity:   . Days of Exercise per Week: Not on file  . Minutes of Exercise per Session: Not on file  Stress:   . Feeling of Stress : Not on file  Social Connections:   . Frequency of Communication with Friends and Family: Not on file  . Frequency of Social Gatherings with Friends and Family: Not on file  . Attends Religious Services: Not on file  . Active Member of Clubs or Organizations: Not on file  . Attends English as a second language teacher Meetings: Not on file  . Marital Status: Not on file  Intimate Partner Violence:   . Fear of Current or Ex-Partner: Not on file  . Emotionally Abused: Not on file  . Physically Abused: Not on file  . Sexually Abused: Not on file    Family History  Problem Relation Age of Onset  . Diabetes Father   . Colon cancer Neg Hx   .  Colon polyps Neg Hx   . Rectal cancer Neg Hx   . Stomach cancer Neg Hx      Review of Systems  Constitutional: Negative.  Negative for chills and fever.  HENT: Negative.  Negative for congestion and sore throat.   Respiratory: Negative.  Negative for cough and shortness of breath.   Cardiovascular: Negative.  Negative for chest pain and palpitations.  Gastrointestinal: Negative.  Negative for abdominal pain, diarrhea, nausea and vomiting.  Genitourinary: Negative.  Negative for hematuria.  Musculoskeletal: Negative.   Skin: Negative.        Itching to lower back since starting Trulicity but no rash.  Neurological: Negative for dizziness and headaches.  All other systems reviewed and are negative.  Vitals:   03/06/19 1027  BP: (!) 160/80  Pulse: 71  Resp: 16  Temp: 98.2 F (36.8 C)  SpO2: 98%     Physical Exam Vitals reviewed.  Constitutional:      Appearance: Normal appearance. He is obese.  HENT:     Head: Normocephalic.  Eyes:     Extraocular Movements: Extraocular movements intact.     Conjunctiva/sclera: Conjunctivae normal.     Pupils: Pupils are equal, round, and reactive to light.  Cardiovascular:     Rate and Rhythm: Normal rate and regular rhythm.     Pulses: Normal pulses.     Heart sounds: Normal heart sounds.  Pulmonary:     Effort: Pulmonary effort is normal.     Breath sounds: Normal breath sounds.  Musculoskeletal:        General: Normal range of motion.     Cervical back: Normal range of motion and neck supple.  Neurological:     General: No focal deficit present.     Mental Status: He is alert and  oriented to person, place, and time.  Psychiatric:        Mood and Affect: Mood normal.        Behavior: Behavior normal.    A total of 25 minutes was spent in the room with the patient, greater than 50% of which was in counseling/coordination of care regarding diabetes and hypertension, cardiovascular risks associated with these, review of most recent blood work, medications and side effects, importance of diet and nutrition as well as physical activity, prognosis, and need for follow-up.   ASSESSMENT & PLAN: Clinically stable.  No medical concerns identified during this visit.  Continue present medications.  No changes.  Follow-up in 6 months.  Jackob was seen today for diabetes.  Diagnoses and all orders for this visit:  Hypertension associated with type 2 diabetes mellitus ([place])  Dyslipidemia associated with type 2 diabetes mellitus ([place]) -     Cancel: POCT glucose (manual entry) -     Cancel: POCT glycosylated hemoglobin (Hb A1C)    Patient Instructions       If you have lab work done today you will be contacted with your lab results within the next 2 weeks.  If you have not heard from [place] then please contact us. The fastest way to get your results is to register for My Chart.   IF you received an x-ray today, you will receive an invoice from [facility]. Please contact [facility] at [phone] with questions or concerns regarding your invoice.   IF you received labwork today, you will receive an invoice from [facility]. Please contact [facility] at [phone] with questions or concerns regarding your invoice.   Our billing staff will not be  able to assist you with questions regarding bills from these companies.  You will be contacted with the lab results as soon as they are available. The fastest way to get your results is to activate your My Chart account. Instructions are located on the last page of this paperwork. If you have not heard from [place]  regarding the results in 2 weeks, please contact this office.     Diabetes mellitus y nutricin, en adultos Diabetes Mellitus and Nutrition, Adult Si sufre de diabetes (diabetes mellitus), es muy importante tener hbitos alimenticios saludables debido a que sus niveles de Designer, television/film set sangre (glucosa) se ven afectados en gran medida por lo que come y bebe. [place] alimentos saludables en las cantidades [place], aproximadamente a la [employer], [place] ayudar a:  Aeronautical engineer glucemia.  Disminuir el riesgo de sufrir una enfermedad cardaca.  Mejorar la presin arterial.  Science writer o mantener un peso saludable. Todas las personas que sufren de diabetes son diferentes y cada una tiene necesidades diferentes en cuanto a un plan de alimentacin. El mdico puede recomendarle que trabaje con un especialista en dietas y nutricin (nutricionista) para Financial trader plan para usted. Su plan de alimentacin puede variar segn factores como:  Las caloras que necesita.  Los medicamentos que toma.  Su peso.  Sus niveles de glucemia, presin arterial y colesterol.  Su nivel de [place].  Otras afecciones que tenga, como enfermedades cardacas o renales. Cmo me afectan los carbohidratos? Los carbohidratos, o hidratos de carbono, afectan su nivel de glucemia ms que cualquier otro tipo de alimento. La ingesta de carbohidratos naturalmente aumenta la cantidad de [employer]. El recuento de carbohidratos es un mtodo destinado a Catering manager un registro de la cantidad de carbohidratos que se consumen. El recuento de carbohidratos es importante para Theatre manager la glucemia a un nivel saludable, especialmente si utiliza insulina o toma determinados medicamentos por va oral para la diabetes. Es importante conocer la cantidad de carbohidratos que se pueden ingerir en cada comida sin correr Engineer, manufacturing. Esto es Psychologist, forensic. Su nutricionista puede ayudarlo a calcular la  cantidad de carbohidratos que debe ingerir en cada comida y en cada refrigerio. Entre los alimentos que contienen carbohidratos, se incluyen:  Pan, cereal, arroz, pastas y galletas.  Papas y maz.  Guisantes, frijoles y lentejas.  Leche y Estate agent.  Lambert Mody y [place].  Postres, como pasteles, galletas, helado y caramelos. Cmo me afecta el alcohol? El alcohol puede provocar disminuciones sbitas de la glucemia (hipoglucemia), especialmente si utiliza insulina o toma determinados medicamentos por va oral para la diabetes. La hipoglucemia es una afeccin potencialmente mortal. Los sntomas de la hipoglucemia (somnolencia, mareos y confusin) son similares a los sntomas de haber consumido demasiado alcohol. Si el mdico afirma que el alcohol es seguro para usted, [place] estas pautas:  Limite el consumo de alcohol a no ms de 2mdida por da si es mujer y no est [place] y a 254midas si es hombre. Una medida equivale a 12oz (35526mde cerveza, 5oz (148m60me vino o 1oz (44ml58m bebidas alcohlicas de alta graduacin.  No beba con el estmago vaco.  Mantngase hidratado bebiendo agua, refrescos dietticos o t helado sin azcar.  Tenga en cuenta que los refrescos comunes, los jugos y otras bebida para mezclOptician, dispensingen contener mucha azcar y se deben contar como carbohidratos. Cules son algunos consejos para seguir este plan?  Leer Brandonos alimentos  Comience por leer el  tamao de la porcin en la "Informacin nutricional" en las etiquetas de los alimentos envasados y las bebidas. La cantidad de caloras, carbohidratos, grasas y otros nutrientes mencionados en la etiqueta se basan en una porcin del alimento. Muchos alimentos contienen ms de una porcin por envase.  Verifique la cantidad total de gramos (g) de carbohidratos totales en una porcin. Puede calcular la cantidad de porciones de carbohidratos al dividir el total de carbohidratos por 15. Por ejemplo, si un  alimento tiene un total de 30g de carbohidratos, equivale a 2 porciones de carbohidratos.  Verifique la cantidad de gramos (g) de grasas saturadas y grasas trans en una porcin. Escoja alimentos que no contengan grasa o que tengan un bajo contenido.  Verifique la cantidad de miligramos (mg) de sal (sodio) en una porcin. [place] [employer] de las personas deben limitar la ingesta de sodio total a menos de 2356m por dTraining and development officer  Siempre consulte la informacin nutricional de los alimentos etiquetados como "con bajo contenido de grasa" o "sin grasa". Estos alimentos pueden tener un mayor contenido de aLocation manageragregada o carbohidratos refinados, y deben evitarse.  Hable con su nutricionista para identificar sus objetivos diarios en cuanto a los nutrientes mencionados en la etiqueta. Al ir de compras  Evite comprar alimentos procesados, enlatados o precocinados. Estos alimentos tienden a tSpecial educational needs teachermayor cantidad de [place] sodio y azcar agregada.  Compre en la zona exterior de la tienda de comestibles. Esta zona incluye frutas y verduras frescas, granos a granel, carnes frescas y productos lcteos frescos. Al cocinar  Utilice mtodos de coccin a baja temperatura, como hornear, en lugar de mtodos de coccin a alta temperatura, como frer en abundante aceite.  Cocine con aceites saludables, como el aceite de oSantee canola o gUnion  Evite cocinar con manteca, crema o carnes con alto contenido de grasa. Planificacin de las comidas  Coma las comidas y los refrigerios regularmente, preferentemente a la misma hora todos lLatimer Evite pasar largos perodos de tiempo sin comer.  Consuma alimentos ricos en fibra, como frutas frescas, verduras, frijoles y cereales integrales. Consulte a su nutricionista sobre cuntas porciones de carbohidratos puede consumir en cada comida.  Consuma entre 4 y 6 onzas (oz) de protenas magras por da, como carnes mDecker pollo, pescado, huevos o tofu. Una onza de protena magra  equivale a: ? 1 onza de carne, pollo o pescado. ? 1huevo. ?  taza de tofu.  Coma algunos alimentos por da que contengan grasas saludables, como aguacates, frutos secos, semillas y pescado. Estilo de vida  Controle su nivel de glucemia con regularidad.  Haga actividad fsica habitualmente como se lo haya indicado el mdico. Esto puede incluir lo siguiente: ? 1577mutos semanales de ejercicio de intensidad moderada o alta. Esto podra incluir caminatas dinmicas, ciclismo o gimnasia acutica. ? Realizar ejercicios de elongacin y de fortalecimiento, como yoga o levantamiento de pesas, por lo menos 2veces por semana.  Tome los meTenneco Ince lo haya indicado el mdico.  No consuma ningn producto que contenga nicotina o tabaco, como cigarrillos y ciPsychologist, sport and exerciseSi necesita ayuda para dejar de fumar, consulte al mdHess Corporationon un asesor o instructor en diabetes para identificar estrategias para controlar el estrs y cualquier desafo emocional y social. Preguntas para hacerle al mdico  Es necesario que consulte a unRadio broadcast assistantn el cuidado de la diabetes?  Es necesario que me rena con un nutricionista?  A qu nmero puedo llamar si tengo preguntas?  Cules son los mejores momentos para  controlar la glucemia? Dnde encontrar ms informacin:  Asociacin Estadounidense de la Diabetes (American Diabetes Association): diabetes.org  Academia de Nutricin y Information systems manager (Academy of Nutrition and Dietetics): www.eatright.[place] Diabetes y las Enfermedades Digestivas y Renales [facility] of Diabetes and Digestive and Kidney Diseases, NIH): DesMoinesFuneral.dk Resumen  Un plan de alimentacin saludable lo ayudar a Aeronautical engineer glucemia y Theatre manager un estilo de vida saludable.  Trabajar con un especialista en dietas y nutricin (nutricionista) puede ayudarlo a Insurance claims handler de alimentacin para usted.  Tenga en cuenta  que los carbohidratos (hidratos de carbono) y el alcohol tienen efectos inmediatos en sus niveles de glucemia. Es importante contar los carbohidratos que ingiere y consumir alcohol con prudencia. Esta informacin no tiene Marine scientist el consejo del mdico. Asegrese de hacerle al mdico cualquier pregunta que tenga. Document Released: 06/08/2007 Document Revised: 11/09/2016 Document Reviewed: 06/21/2016 Elsevier Patient Education  [place].  Hypertension, Adult High blood pressure (hypertension) is when the force of blood pumping through the arteries is too strong. The arteries are the blood vessels that carry blood from the heart throughout the body. Hypertension forces the heart to work harder to pump blood and may cause arteries to become narrow or stiff. Untreated or uncontrolled hypertension can cause a heart attack, heart failure, a stroke, kidney disease, and other problems. A blood pressure reading consists of a higher number over a lower number. Ideally, your blood pressure should be below 120/80. The first ("top") number is called the systolic pressure. It is a measure of the pressure in your arteries as your heart beats. The second ("bottom") number is called the diastolic pressure. It is a measure of the pressure in your arteries as the heart relaxes. What are the causes? The exact cause of this condition is not known. There are some conditions that result in or are related to high blood pressure. What increases the risk? Some risk factors for high blood pressure are under your control. The following factors may make you more likely to develop this condition:  Smoking.  Having type 2 diabetes mellitus, high cholesterol, or both.  Not getting enough exercise or physical activity.  Being overweight.  Having too much fat, sugar, calories, or salt (sodium) in your diet.  Drinking too much alcohol. Some risk factors for high blood pressure may be difficult or  impossible to change. Some of these factors include:  Having chronic kidney disease.  Having a family history of high blood pressure.  Age. Risk increases with age.  Race. You may be at higher risk if you are African American.  Gender. Men are at higher risk than women before age 59. After age 28, women are at higher risk than men.  Having obstructive sleep apnea.  Stress. What are the signs or symptoms? High blood pressure may not cause symptoms. Very high blood pressure (hypertensive crisis) may cause:  Headache.  Anxiety.  Shortness of breath.  Nosebleed.  Nausea and vomiting.  Vision changes.  Severe chest pain.  Seizures. How is this diagnosed? This condition is diagnosed by measuring your blood pressure while you are seated, with your arm resting on a flat surface, your legs uncrossed, and your feet flat on the floor. The cuff of the blood pressure monitor will be placed directly against the skin of your upper arm at the level of your heart. It should be measured at least twice using the same arm. Certain conditions can cause a difference in  blood pressure between your right and left arms. Certain factors can cause blood pressure readings to be lower or higher than normal for a short period of time:  When your blood pressure is higher when you are in a health care provider's office than when you are at home, this is called white coat hypertension. Most people with this condition do not need medicines.  When your blood pressure is higher at home than when you are in a health care provider's office, this is called masked hypertension. Most people with this condition may need medicines to control blood pressure. If you have a high blood pressure reading during one visit or you have normal blood pressure with other risk factors, you may be asked to:  Return on a different day to have your blood pressure checked again.  Monitor your blood pressure at home for 1 week or  longer. If you are diagnosed with hypertension, you may have other blood or imaging tests to help your health care provider understand your overall risk for other conditions. How is this treated? This condition is treated by making healthy lifestyle changes, such as eating healthy foods, exercising more, and reducing your alcohol intake. Your health care provider may prescribe medicine if lifestyle changes are not enough to get your blood pressure under control, and if:  Your systolic blood pressure is above 130.  Your diastolic blood pressure is above 80. Your personal target blood pressure may vary depending on your medical conditions, your age, and other factors. Follow these instructions at home: Eating and drinking   Eat a diet that is high in fiber and potassium, and low in sodium, added sugar, and fat. An example eating plan is called the DASH (Dietary Approaches to Stop Hypertension) diet. To eat this way: ? Eat plenty of fresh fruits and vegetables. Try to fill one half of your plate at each meal with fruits and vegetables. ? Eat whole grains, such as whole-wheat pasta, brown rice, or whole-grain bread. Fill about one fourth of your plate with whole grains. ? Eat or drink low-fat dairy products, such as skim milk or low-fat yogurt. ? Avoid fatty cuts of meat, processed or cured meats, and poultry with skin. Fill about one fourth of your plate with lean proteins, such as fish, chicken without skin, beans, eggs, or tofu. ? Avoid pre-made and processed foods. These tend to be higher in sodium, added sugar, and fat.  Reduce your daily sodium intake. Most people with hypertension should eat less than 1,500 mg of sodium a day.  Do not drink alcohol if: ? Your health care provider tells you not to drink. ? You are pregnant, may be pregnant, or are planning to become pregnant.  If you drink alcohol: ? Limit how much you use to:  0-1 drink a day for women.  0-2 drinks a day for  men. ? Be aware of how much alcohol is in your drink. In the [place] one drink equals one 12 oz bottle of beer (355 mL), one 5 oz glass of wine (148 mL), or one 1 oz glass of hard liquor (44 mL). Lifestyle   Work with your health care provider to maintain a healthy body weight or to lose weight. Ask what an ideal weight is for you.  Get at least 30 minutes of exercise most days of the week. Activities may include walking, swimming, or biking.  Include exercise to strengthen your muscles (resistance exercise), such as Pilates or lifting weights, as part  of your weekly exercise routine. Try to do these types of exercises for 30 minutes at least 3 days a week.  Do not use any products that contain nicotine or tobacco, such as cigarettes, e-cigarettes, and chewing tobacco. If you need help quitting, ask your health care provider.  Monitor your blood pressure at home as told by your health care provider.  Keep all follow-up visits as told by your health care provider. This is important. Medicines  Take over-the-counter and prescription medicines only as told by your health care provider. Follow directions carefully. Blood pressure medicines must be taken as prescribed.  Do not skip doses of blood pressure medicine. Doing this puts you at risk for problems and can make the medicine less effective.  Ask your health care provider about side effects or reactions to medicines that you should watch for. Contact a health care provider if you:  Think you are having a reaction to a medicine you are taking.  Have headaches that keep coming back (recurring).  Feel dizzy.  Have swelling in your ankles.  Have trouble with your vision. Get help right away if you:  Develop a severe headache or confusion.  Have unusual weakness or numbness.  Feel faint.  Have severe pain in your chest or abdomen.  Vomit repeatedly.  Have trouble breathing. Summary  Hypertension is when the force of blood  pumping through your arteries is too strong. If this condition is not controlled, it may put you at risk for serious complications.  Your personal target blood pressure may vary depending on your medical conditions, your age, and other factors. For most people, a normal blood pressure is less than 120/80.  Hypertension is treated with lifestyle changes, medicines, or a combination of both. Lifestyle changes include losing weight, eating a healthy, low-sodium diet, exercising more, and limiting alcohol. This information is not intended to replace advice given to you by your health care provider. Make sure you discuss any questions you have with your health care provider. Document Released: 03/01/2005 Document Revised: 11/09/2017 Document Reviewed: 11/09/2017 Elsevier Patient Education  2020 Elsevier Inc.      Agustina Caroli, MD [facility]

## 2019-03-06 NOTE — Patient Instructions (Addendum)
   If you have lab work done today you will be contacted with your lab results within the next 2 weeks.  If you have not heard from us then please contact us. The fastest way to get your results is to register for My Chart.   IF you received an x-ray today, you will receive an invoice from [facility]. Please contact [facility] at [phone] with questions or concerns regarding your invoice.   IF you received labwork today, you will receive an invoice from [facility]. Please contact [facility] at [phone] with questions or concerns regarding your invoice.   Our billing staff will not be able to assist you with questions regarding bills from these companies.  You will be contacted with the lab results as soon as they are available. The fastest way to get your results is to activate your My Chart account. Instructions are located on the last page of this paperwork. If you have not heard from us regarding the results in 2 weeks, please contact this office.     Diabetes mellitus y nutricin, en adultos Diabetes Mellitus and Nutrition, Adult Si sufre de diabetes (diabetes mellitus), es muy importante tener hbitos alimenticios saludables debido a que sus niveles de azcar en la sangre (glucosa) se ven afectados en gran medida por lo que come y bebe. [place] alimentos saludables en las cantidades adecuadas, aproximadamente a la misma hora todos los das, lo ayudar a:  Controlar la glucemia.  Disminuir el riesgo de sufrir una enfermedad cardaca.  Mejorar la presin arterial.  Alcanzar o mantener un peso saludable. Todas las personas que sufren de diabetes son diferentes y cada una tiene necesidades diferentes en cuanto a un plan de alimentacin. El mdico puede recomendarle que trabaje con un especialista en dietas y nutricin (nutricionista) para elaborar el mejor plan para usted. Su plan de alimentacin puede variar segn factores como:  Las caloras que  necesita.  Los medicamentos que toma.  Su peso.  Sus niveles de glucemia, presin arterial y colesterol.  Su nivel de actividad.  Otras afecciones que tenga, como enfermedades cardacas o renales. Cmo me afectan los carbohidratos? Los carbohidratos, o hidratos de carbono, afectan su nivel de glucemia ms que cualquier otro tipo de alimento. La ingesta de carbohidratos naturalmente aumenta la cantidad de glucosa en la sangre. El recuento de carbohidratos es un mtodo destinado a llevar un registro de la cantidad de carbohidratos que se consumen. El recuento de carbohidratos es importante para mantener la glucemia a un nivel saludable, especialmente si utiliza insulina o toma determinados medicamentos por va oral para la diabetes. Es importante conocer la cantidad de carbohidratos que se pueden ingerir en cada comida sin correr ningn riesgo. Esto es diferente en cada persona. Su nutricionista puede ayudarlo a calcular la cantidad de carbohidratos que debe ingerir en cada comida y en cada refrigerio. Entre los alimentos que contienen carbohidratos, se incluyen:  Pan, cereal, arroz, pastas y galletas.  Papas y maz.  Guisantes, frijoles y lentejas.  Leche y yogur.  Frutas y jugo.  Postres, como pasteles, galletas, helado y caramelos. Cmo me afecta el alcohol? El alcohol puede provocar disminuciones sbitas de la glucemia (hipoglucemia), especialmente si utiliza insulina o toma determinados medicamentos por va oral para la diabetes. La hipoglucemia es una afeccin potencialmente mortal. Los sntomas de la hipoglucemia (somnolencia, mareos y confusin) son similares a los sntomas de haber consumido demasiado alcohol. Si el mdico afirma que el alcohol es seguro para usted, siga estas pautas:    Limite el consumo de alcohol a no ms de 1medida por da si es mujer y no est embarazada, y a 2medidas si es hombre. Una medida equivale a 12oz (355ml) de cerveza, 5oz (148ml) de vino o  1oz (44ml) de bebidas alcohlicas de alta graduacin.  No beba con el estmago vaco.  Mantngase hidratado bebiendo agua, refrescos dietticos o t helado sin azcar.  Tenga en cuenta que los refrescos comunes, los jugos y otras bebida para mezclar pueden contener mucha azcar y se deben contar como carbohidratos. Cules son algunos consejos para seguir este plan?  Leer las etiquetas de los alimentos  Comience por leer el tamao de la porcin en la "Informacin nutricional" en las etiquetas de los alimentos envasados y las bebidas. La cantidad de caloras, carbohidratos, grasas y otros nutrientes mencionados en la etiqueta se basan en una porcin del alimento. Muchos alimentos contienen ms de una porcin por envase.  Verifique la cantidad total de gramos (g) de carbohidratos totales en una porcin. Puede calcular la cantidad de porciones de carbohidratos al dividir el total de carbohidratos por 15. Por ejemplo, si un alimento tiene un total de 30g de carbohidratos, equivale a 2 porciones de carbohidratos.  Verifique la cantidad de gramos (g) de grasas saturadas y grasas trans en una porcin. Escoja alimentos que no contengan grasa o que tengan un bajo contenido.  Verifique la cantidad de miligramos (mg) de sal (sodio) en una porcin. La mayora de las personas deben limitar la ingesta de sodio total a menos de 2300mg por da.  Siempre consulte la informacin nutricional de los alimentos etiquetados como "con bajo contenido de grasa" o "sin grasa". Estos alimentos pueden tener un mayor contenido de azcar agregada o carbohidratos refinados, y deben evitarse.  Hable con su nutricionista para identificar sus objetivos diarios en cuanto a los nutrientes mencionados en la etiqueta. Al ir de compras  Evite comprar alimentos procesados, enlatados o precocinados. Estos alimentos tienden a tener una mayor cantidad de grasa, sodio y azcar agregada.  Compre en la zona exterior de la tienda de  comestibles. Esta zona incluye frutas y verduras frescas, granos a granel, carnes frescas y productos lcteos frescos. Al cocinar  Utilice mtodos de coccin a baja temperatura, como hornear, en lugar de mtodos de coccin a alta temperatura, como frer en abundante aceite.  Cocine con aceites saludables, como el aceite de oliva, canola o girasol.  Evite cocinar con manteca, crema o carnes con alto contenido de grasa. Planificacin de las comidas  Coma las comidas y los refrigerios regularmente, preferentemente a la misma hora todos los das. Evite pasar largos perodos de tiempo sin comer.  Consuma alimentos ricos en fibra, como frutas frescas, verduras, frijoles y cereales integrales. Consulte a su nutricionista sobre cuntas porciones de carbohidratos puede consumir en cada comida.  Consuma entre 4 y 6 onzas (oz) de protenas magras por da, como carnes magras, pollo, pescado, huevos o tofu. Una onza de protena magra equivale a: ? 1 onza de carne, pollo o pescado. ? 1huevo. ?  taza de tofu.  Coma algunos alimentos por da que contengan grasas saludables, como aguacates, frutos secos, semillas y pescado. Estilo de vida  Controle su nivel de glucemia con regularidad.  Haga actividad fsica habitualmente como se lo haya indicado el mdico. Esto puede incluir lo siguiente: ? 150minutos semanales de ejercicio de intensidad moderada o alta. Esto podra incluir caminatas dinmicas, ciclismo o gimnasia acutica. ? Realizar ejercicios de elongacin y de fortalecimiento, como yoga o levantamiento   de pesas, por lo menos 2veces por semana.  Tome los [employer] se lo haya indicado el mdico.  No consuma ningn producto que contenga nicotina o tabaco, como cigarrillos y Psychologist, sport and exercise. Si necesita ayuda para dejar de fumar, consulte al [employer] con un asesor o instructor en diabetes para identificar estrategias para controlar el estrs y cualquier desafo emocional  y social. Preguntas para hacerle al mdico  Es necesario que consulte a Radio broadcast assistant en el cuidado de la diabetes?  Es necesario que me rena con un nutricionista?  A qu nmero puedo llamar si tengo preguntas?  Cules son los mejores momentos para controlar la glucemia? Dnde encontrar ms informacin:  Asociacin Estadounidense de la Diabetes (American Diabetes Association): diabetes.org  Academia de Nutricin y Information systems manager (Academy of Nutrition and Dietetics): www.eatright.[place] Diabetes y las Enfermedades Digestivas y [place] [facility] of Diabetes and Digestive and Kidney Diseases, NIH): [employer] Resumen  Un plan de alimentacin saludable lo ayudar a Aeronautical engineer glucemia y Theatre manager un estilo de vida saludable.  Trabajar con un especialista en dietas y nutricin (nutricionista) puede ayudarlo a Insurance claims handler de alimentacin para usted.  Tenga en cuenta que los carbohidratos (hidratos de carbono) y el alcohol tienen efectos inmediatos en sus niveles de glucemia. Es importante contar los carbohidratos que ingiere y consumir alcohol con prudencia. Esta informacin no tiene Marine scientist el consejo del mdico. Asegrese de hacerle al mdico cualquier pregunta que tenga. Document Released: 06/08/2007 Document Revised: 11/09/2016 Document Reviewed: 06/21/2016 Elsevier Patient Education  [place].  Hypertension, Adult High blood pressure (hypertension) is when the force of blood pumping through the arteries is too strong. The arteries are the blood vessels that carry blood from the heart throughout the body. Hypertension forces the heart to work harder to pump blood and may cause arteries to become narrow or stiff. Untreated or uncontrolled hypertension can cause a heart attack, heart failure, a stroke, kidney disease, and other problems. A blood pressure reading consists of a higher number over a lower number.  Ideally, your blood pressure should be below 120/80. The first ("top") number is called the systolic pressure. It is a measure of the pressure in your arteries as your heart beats. The second ("bottom") number is called the diastolic pressure. It is a measure of the pressure in your arteries as the heart relaxes. What are the causes? The exact cause of this condition is not known. There are some conditions that result in or are related to high blood pressure. What increases the risk? Some risk factors for high blood pressure are under your control. The following factors may make you more likely to develop this condition:  Smoking.  Having type 2 diabetes mellitus, high cholesterol, or both.  Not getting enough exercise or physical activity.  Being overweight.  Having too much fat, sugar, calories, or salt (sodium) in your diet.  Drinking too much alcohol. Some risk factors for high blood pressure may be difficult or impossible to change. Some of these factors include:  Having chronic kidney disease.  Having a family history of high blood pressure.  Age. Risk increases with age.  Race. You may be at higher risk if you are African American.  Gender. Men are at higher risk than women before age 47. After age 20, women are at higher risk than men.  Having obstructive sleep apnea.  Stress. What are the signs or symptoms? High blood pressure may not  cause symptoms. Very high blood pressure (hypertensive crisis) may cause:  Headache.  Anxiety.  Shortness of breath.  Nosebleed.  Nausea and vomiting.  Vision changes.  Severe chest pain.  Seizures. How is this diagnosed? This condition is diagnosed by measuring your blood pressure while you are seated, with your arm resting on a flat surface, your legs uncrossed, and your feet flat on the floor. The cuff of the blood pressure monitor will be placed directly against the skin of your upper arm at the level of your heart. It  should be measured at least twice using the same arm. Certain conditions can cause a difference in blood pressure between your right and left arms. Certain factors can cause blood pressure readings to be lower or higher than normal for a short period of time:  When your blood pressure is higher when you are in a health care provider's office than when you are at home, this is called white coat hypertension. Most people with this condition do not need medicines.  When your blood pressure is higher at home than when you are in a health care provider's office, this is called masked hypertension. Most people with this condition may need medicines to control blood pressure. If you have a high blood pressure reading during one visit or you have normal blood pressure with other risk factors, you may be asked to:  Return on a different day to have your blood pressure checked again.  Monitor your blood pressure at home for 1 week or longer. If you are diagnosed with hypertension, you may have other blood or imaging tests to help your health care provider understand your overall risk for other conditions. How is this treated? This condition is treated by making healthy lifestyle changes, such as eating healthy foods, exercising more, and reducing your alcohol intake. Your health care provider may prescribe medicine if lifestyle changes are not enough to get your blood pressure under control, and if:  Your systolic blood pressure is above 130.  Your diastolic blood pressure is above 80. Your personal target blood pressure may vary depending on your medical conditions, your age, and other factors. Follow these instructions at home: Eating and drinking   Eat a diet that is high in fiber and potassium, and low in sodium, added sugar, and fat. An example eating plan is called the DASH (Dietary Approaches to Stop Hypertension) diet. To eat this way: ? Eat plenty of fresh fruits and vegetables. Try to fill  one half of your plate at each meal with fruits and vegetables. ? Eat whole grains, such as whole-wheat pasta, brown rice, or whole-grain bread. Fill about one fourth of your plate with whole grains. ? Eat or drink low-fat dairy products, such as skim milk or low-fat yogurt. ? Avoid fatty cuts of meat, processed or cured meats, and poultry with skin. Fill about one fourth of your plate with lean proteins, such as fish, chicken without skin, beans, eggs, or tofu. ? Avoid pre-made and processed foods. These tend to be higher in sodium, added sugar, and fat.  Reduce your daily sodium intake. Most people with hypertension should eat less than 1,500 mg of sodium a day.  Do not drink alcohol if: ? Your health care provider tells you not to drink. ? You are pregnant, may be pregnant, or are planning to become pregnant.  If you drink alcohol: ? Limit how much you use to:  0-1 drink a day for women.  0-2 drinks a  day for men. ? Be aware of how much alcohol is in your drink. In the [place] one drink equals one 12 oz bottle of beer (355 mL), one 5 oz glass of wine (148 mL), or one 1 oz glass of hard liquor (44 mL). Lifestyle   Work with your health care provider to maintain a healthy body weight or to lose weight. Ask what an ideal weight is for you.  Get at least 30 minutes of exercise most days of the week. Activities may include walking, swimming, or biking.  Include exercise to strengthen your muscles (resistance exercise), such as Pilates or lifting weights, as part of your weekly exercise routine. Try to do these types of exercises for 30 minutes at least 3 days a week.  Do not use any products that contain nicotine or tobacco, such as cigarettes, e-cigarettes, and chewing tobacco. If you need help quitting, ask your health care provider.  Monitor your blood pressure at home as told by your health care provider.  Keep all follow-up visits as told by your health care provider. This is  important. Medicines  Take over-the-counter and prescription medicines only as told by your health care provider. Follow directions carefully. Blood pressure medicines must be taken as prescribed.  Do not skip doses of blood pressure medicine. Doing this puts you at risk for problems and can make the medicine less effective.  Ask your health care provider about side effects or reactions to medicines that you should watch for. Contact a health care provider if you:  Think you are having a reaction to a medicine you are taking.  Have headaches that keep coming back (recurring).  Feel dizzy.  Have swelling in your ankles.  Have trouble with your vision. Get help right away if you:  Develop a severe headache or confusion.  Have unusual weakness or numbness.  Feel faint.  Have severe pain in your chest or abdomen.  Vomit repeatedly.  Have trouble breathing. Summary  Hypertension is when the force of blood pumping through your arteries is too strong. If this condition is not controlled, it may put you at risk for serious complications.  Your personal target blood pressure may vary depending on your medical conditions, your age, and other factors. For most people, a normal blood pressure is less than 120/80.  Hypertension is treated with lifestyle changes, medicines, or a combination of both. Lifestyle changes include losing weight, eating a healthy, low-sodium diet, exercising more, and limiting alcohol. This information is not intended to replace advice given to you by your health care provider. Make sure you discuss any questions you have with your health care provider. Document Released: 03/01/2005 Document Revised: 11/09/2017 Document Reviewed: 11/09/2017 Elsevier Patient Education  2020 [employer].

## 2019-03-19 MED FILL — AMLODIPINE BESYLATE 5 MG TA: 5 | 90 days supply | Qty: 90 | Fill #1

## 2019-03-19 MED FILL — ATORVASTATIN 20 MG TABLET: 20 | 90 days supply | Qty: 90 | Fill #1

## 2019-03-27 ENCOUNTER — Other Ambulatory Visit: Payer: Self-pay | Admitting: Emergency Medicine

## 2019-03-27 MED FILL — OMEPRAZOLE 20 MG CAPSULE DR: 20 | 90 days supply | Qty: 90 | Fill #0

## 2019-04-02 ENCOUNTER — Other Ambulatory Visit: Payer: Self-pay

## 2019-04-04 ENCOUNTER — Other Ambulatory Visit: Payer: Self-pay

## 2019-04-04 ENCOUNTER — Encounter: Payer: Self-pay | Admitting: Endocrinology

## 2019-04-04 ENCOUNTER — Ambulatory Visit (INDEPENDENT_AMBULATORY_CARE_PROVIDER_SITE_OTHER): Payer: 59 | Admitting: Endocrinology

## 2019-04-04 VITALS — BP 138/78 | HR 78 | Ht 73.0 in | Wt 265.4 lb

## 2019-04-04 DIAGNOSIS — Z794 Long term (current) use of insulin: Secondary | ICD-10-CM

## 2019-04-04 DIAGNOSIS — E119 Type 2 diabetes mellitus without complications: Secondary | ICD-10-CM | POA: Diagnosis not present

## 2019-04-04 LAB — POCT GLYCOSYLATED HEMOGLOBIN (HGB A1C): Hemoglobin A1C: 7.2 % — AB (ref 4.0–5.6)

## 2019-04-04 MED ORDER — OZEMPIC (1 MG/DOSE) 2 MG/1.5ML ~~LOC~~ SOPN: 1.0000 mg | PEN_INJECTOR | SUBCUTANEOUS | 3 refills | Status: DC

## 2019-04-04 MED FILL — OZEMPIC 1 MG/DOSE SOPN: 2 | 28 days supply | Qty: 3 | Fill #0

## 2019-04-04 NOTE — Progress Notes (Signed)
Subjective:    Patient ID: James Garcia, male    DOB: 11-26-1955, 64 y.o.   MRN: [id number]  HPI Pt returns for f/u of diabetes mellitus: DM type: Insulin-requiring type 2 Dx'ed: 9678 Complications: polyneuropathy and DR.   Therapy: insulin since 9381, Trulicity, and metformin.   DKA: never Severe hypoglycemia: never.  Pancreatitis: never.   Other: he declines multiple daily injections; he works 2nd shift. QD insulin was changed to levemir, due to pattern of cbg's; He stopped farxiga, due to itching.   Interval history: Pt says he never misses the insulin.  He brings his meter with his cbg's which I have reviewed today.  cbg's vary from 90-324.  There is no trend throughout the day. No recent steroids.  Past Medical History:  Diagnosis Date  . Constipation    uses OTC laxatives - hard stools every morning- small amount and feels like doesnt empty   . Diabetes mellitus without complication ([place])   . DM type 2 (diabetes mellitus, type 2) ([place])   . Hyperlipidemia   . Hypertension   . Leg cramps   . 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 Tuberculosis    pt states was treated for TB    Past Surgical History:  Procedure Laterality Date  . arm surgery    . COLONOSCOPY    . ELBOW SURGERY    . FOOT SURGERY Bilateral   . POLYPECTOMY      Social History   Socioeconomic History  . Marital status: Single    Spouse name: Not on file  . Number of children: Not on file  . Years of education: Not on file  . Highest education level: Not on file  Occupational History  . Occupation: Engineer, maintenance (IT) at [employer]  . Smoking status: Former Smoker    Packs/day: 0.25    Years: 5.00    Pack years: 1.25    Types: Cigarettes    Quit date: 06/04/1978    Years since quitting: 40.8  . Smokeless tobacco: Never Used  Substance and Sexual Activity  . Alcohol use: No  . Drug use: No  . Sexual activity: Not on file  Other Topics Concern  . Not on file  Social History Narrative   **  Merged History Encounter **       Social Determinants of Health   Financial Resource Strain:   . Difficulty of Paying Living Expenses: Not on file  Food Insecurity:   . Worried About Charity fundraiser in the Last Year: Not on file  . Ran Out of Food in the Last Year: Not on file  Transportation Needs:   . Lack of Transportation (Medical): Not on file  . Lack of Transportation (Non-Medical): Not on file  Physical Activity:   . Days of Exercise per Week: Not on file  . Minutes of Exercise per Session: Not on file  Stress:   . Feeling of Stress : Not on file  Social Connections:   . Frequency of Communication with Friends and Family: Not on file  . Frequency of Social Gatherings with Friends and Family: Not on file  . Attends Religious Services: Not on file  . Active Member of Clubs or Organizations: Not on file  . Attends Archivist Meetings: Not on file  . Marital Status: Not on file  Intimate Partner Violence:   . Fear of Current or Ex-Partner: Not on file  . Emotionally Abused: Not on  file  . Physically Abused: Not on file  . Sexually Abused: Not on file    Current Outpatient Medications on File Prior to Visit  Medication Sig Dispense Refill  . albuterol (PROVENTIL HFA;VENTOLIN HFA) 108 (90 Base) MCG/ACT inhaler Inhale 1-2 puffs into the lungs every 4 (four) hours as needed for wheezing or shortness of breath. 1 Inhaler 0  . amLODipine (NORVASC) 5 MG tablet TAKE 1 TABLET BY MOUTH DAILY. 90 tablet 1  . atorvastatin (LIPITOR) 20 MG tablet TAKE 1 TABLET BY MOUTH DAILY. 90 tablet 1  . Blood Glucose Monitoring Suppl (FREESTYLE LITE) DEVI 1 kit by Does not apply route daily. Use to test blood sugars daily 1 each 0  . Blood Pressure Monitor KIT Use as instructed for HTN 1 each 0  . cyclobenzaprine (FLEXERIL) 5 MG tablet Take 1 tablet (5 mg total) by mouth at bedtime. 7 tablet 0  . diclofenac (VOLTAREN) 75 MG EC tablet Take 1 tablet (75 mg total) by mouth 2 (two) times  daily. 14 tablet 0  . gabapentin (NEURONTIN) 100 MG capsule TAKE 1 CAPSULE BY MOUTH 2 TIMES A DAY 180 capsule 2  . glucose blood (FREESTYLE LITE) test strip USE TO CHECK BLOOD SUGAR 2 TIMES PER DAY. 200 strip 0  . Insulin Detemir (LEVEMIR FLEXTOUCH) 100 UNIT/ML Pen Inject 90 Units into the skin every morning. And pen needles 2/day 40 pen 3  . Lancets (FREESTYLE) lancets USE TO CHECK BLOOD SUGAR 2 TIMES PER DAY. 200 each 2  . losartan (COZAAR) 100 MG tablet Take 1 tablet (100 mg total) by mouth daily. 90 tablet 0  . metFORMIN (GLUCOPHAGE-XR) 500 MG 24 hr tablet Take 4 tablets (2,000 mg total) by mouth daily with breakfast. 360 tablet 3  . omeprazole (PRILOSEC) 20 MG capsule TAKE 1 CAPSULE (20 MG TOTAL) BY MOUTH EVERY MORNING. 90 capsule 0   Current Facility-Administered Medications on File Prior to Visit  Medication Dose Route Frequency Provider Last Rate Last Admin  . 0.9 %  sodium chloride infusion  500 mL Intravenous Once Pyrtle, Lajuan Lines, MD        No Known Allergies  Family History  Problem Relation Age of Onset  . Diabetes Father   . Colon cancer Neg Hx   . Colon polyps Neg Hx   . Rectal cancer Neg Hx   . Stomach cancer Neg Hx     BP 138/78 (BP Location: Left Arm, Patient Position: Sitting, Cuff Size: Large)   Pulse 78   Ht '6\' 1"'$  (1.854 m)   Wt 265 lb 6.4 oz (120.4 kg)   SpO2 100%   BMI 35.02 kg/m    Review of Systems Pt says truliciuty causes a rash and itching at the injection site.     Objective:   Physical Exam VITAL SIGNS:  See vs page GENERAL: no distress Pulses: dorsalis pedis intact bilat.   MSK: no deformity of the feet CV: trace bilat leg edema Skin:  no ulcer on the feet.  normal color and temp on the feet. Neuro: sensation is intact to touch on the feet Ext: there is bilateral onychomycosis of the toenails  A1c=7.2%     Assessment & Plan:  Insulin-requiring type 2 DM, with DR: this is the best control this pt should aim for, given this regimen, which  does match insulin to his changing needs throughout the day Rash, new, poss due to trulicity Edema: This limits rx options  Patient Instructions  I have sent a  prescription to your pharmacy, to change Trulicity to Ozempic. Please continue the same other diabetes medications.   check your blood sugar twice a day.  vary the time of day when you check, between before the 3 meals, and at bedtime.  also check if you have symptoms of your blood sugar being too high or too low.  please keep a record of the readings and bring it to your next appointment here (or you can bring the meter itself).  You can write it on any piece of paper.  please call us sooner if your blood sugar goes below 70, or if you have a lot of readings over 200. Please come back for a follow-up appointment in 3 months

## 2019-04-04 NOTE — Patient Instructions (Addendum)
I have sent a prescription to your pharmacy, to change Trulicity to Ozempic. Please continue the same other diabetes medications.   check your blood sugar twice a day.  vary the time of day when you check, between before the 3 meals, and at bedtime.  also check if you have symptoms of your blood sugar being too high or too low.  please keep a record of the readings and bring it to your next appointment here (or you can bring the meter itself).  You can write it on any piece of paper.  please call us sooner if your blood sugar goes below 70, or if you have a lot of readings over 200. Please come back for a follow-up appointment in 3 months

## 2019-04-06 ENCOUNTER — Telehealth: Payer: Self-pay

## 2019-04-06 NOTE — Telephone Encounter (Signed)
Please verify no n/v/sob Please increase Levemir to 110 units each morning. Please call or message [place] next week, to tell us how the blood sugar is doing

## 2019-04-06 NOTE — Telephone Encounter (Signed)
Returned pt call with an attempt to obtain last 3 days worth of CBG's. Pt was very anxious and states, "I am at work and I do not have my meter." Further added, "I am very hot and I cannot work like this." Continued to add, "my blood sugar can be as low as 180 but can be well over 200." Verified with pt that his IS taking all medications as prescribed EXCEPT Ozempic. States he just picked it up and has not yet started it. Pt then states, "I need an answer now because I don't feel good." Advised pt I could not advise him with new medication orders but if he did not feel well and could not wait for a returned call with Dr. Cordelia Pen advice, advise he proceed to the ED for further evaluation. Pt states he did not want to wait and would head to the ED. Routing this message to Dr. Loanne Drilling.

## 2019-04-06 NOTE — Telephone Encounter (Signed)
Blood sugar is running high-please contact patient to give advice [phone]

## 2019-04-09 NOTE — Telephone Encounter (Signed)
LVM requesting returned call 

## 2019-04-09 NOTE — Telephone Encounter (Signed)
Pt returned call. Informed him about Dr. Cordelia Pen recommendation below. Declined to change insulin at this time. States he wants to give Ozempic a try first, see how it works and if symptoms/CBG's remain unchanged, will follow Dr. Cordelia Pen recommendation. Further added, he took his first Ozempic dose last night. Stated he noticed a knot at the injection site but denied any warmth, redness or rash-like appearance at the injection site. Advised to monitor site for any changes and to call if he notices any warmth, redness or develops any fevers. Also reminded to alternate injection sites. Verbalized acceptance and understanding.

## 2019-04-18 ENCOUNTER — Other Ambulatory Visit: Payer: Self-pay | Admitting: Endocrinology

## 2019-04-18 MED FILL — METFORMIN HCL ER 500 MG TB2: 500 | 90 days supply | Qty: 360 | Fill #0

## 2019-05-02 ENCOUNTER — Telehealth: Payer: Self-pay | Admitting: Endocrinology

## 2019-05-02 MED FILL — OZEMPIC 1 MG/DOSE SOPN: 2 | 28 days supply | Qty: 3 | Fill #1

## 2019-05-02 NOTE — Telephone Encounter (Signed)
Attempted to call the pt to make him aware of Dr. Cordelia Pen response below. Phone rang x1 then immediately disconnected.

## 2019-05-02 NOTE — Telephone Encounter (Signed)
???   Is this something that the pharmacists are able to assist with by logging in to the [employer] website?

## 2019-05-02 NOTE — Telephone Encounter (Signed)
Patient ph# [phone] requests a discount card for Insulin Detemir (LEVEMIR FLEXTOUCH) 100 UNIT/ML Pen be faxed to:  [place], [place] - [address] [facility]. Phone:  [phone]  Fax:  [phone]

## 2019-05-02 NOTE — Telephone Encounter (Signed)
Yes, it is at [email or web address], or you could search for it on the internet.

## 2019-05-04 ENCOUNTER — Telehealth: Payer: Self-pay | Admitting: Endocrinology

## 2019-05-04 NOTE — Telephone Encounter (Signed)
[facility] pharmacy called seeing if patient could switch from levemir to basaglar due to not being able to get a discount for the levemir.  [place], [place] - [address]. Phone:  [phone]  Fax:  [phone]

## 2019-05-04 NOTE — Telephone Encounter (Signed)
I can make the change, but it is not a unit-for-unit change, and basaglar is a slower time-release insulin.  Pt first needs to be aware of the reduction in dosage, and he would need f/u the next week.  OK with him?

## 2019-05-04 NOTE — Telephone Encounter (Signed)
LVM requesting returned call 

## 2019-05-04 NOTE — Telephone Encounter (Signed)
Please advise 

## 2019-05-04 NOTE — Telephone Encounter (Signed)
na

## 2019-05-07 ENCOUNTER — Other Ambulatory Visit (HOSPITAL_COMMUNITY): Payer: Self-pay | Admitting: Endocrinology

## 2019-05-07 MED ORDER — BASAGLAR KWIKPEN 100 UNIT/ML ~~LOC~~ SOPN: 60.0000 [IU] | PEN_INJECTOR | SUBCUTANEOUS | 3 refills | Status: DC

## 2019-05-07 MED FILL — BASAGLAR 100 UNIT/ML KWIKPE: 100 | 25 days supply | Qty: 15 | Fill #0

## 2019-05-07 NOTE — Telephone Encounter (Signed)
SECOND ATTEMPT: ° °LVM requesting returned call. °

## 2019-05-07 NOTE — Addendum Note (Signed)
Addended by: Renato Shin on: 05/07/2019 12:08 PM   Modules accepted: Orders

## 2019-05-07 NOTE — Telephone Encounter (Signed)
Pt has agreed to change Rx and verbalized understanding of reduction in dosage. Scheduled for f/u 05/16/19. Please send Rx for [employer]

## 2019-05-07 NOTE — Telephone Encounter (Signed)
OK, I have sent a prescription to your pharmacy.  

## 2019-05-07 NOTE — Telephone Encounter (Signed)
Outpatient Medication Detail   Disp Refills Start End   Insulin Glargine (BASAGLAR KWIKPEN) 100 UNIT/ML SOPN 20 pen 3 05/07/2019    Sig - Route: Inject 0.6 mLs (60 Units total) into the skin every morning. And pen needles 2/day - Subcutaneous   Sent to pharmacy as: Insulin Glargine (BASAGLAR KWIKPEN) 100 UNIT/ML Solution Pen-injector   E-Prescribing Status: Receipt confirmed by pharmacy (05/07/2019 12:41 PM EST)

## 2019-05-08 ENCOUNTER — Telehealth: Payer: Self-pay

## 2019-05-08 ENCOUNTER — Other Ambulatory Visit: Payer: Self-pay | Admitting: Endocrinology

## 2019-05-08 MED FILL — FREESTYLE LITE TEST STRIP: 75 days supply | Qty: 150 | Fill #0

## 2019-05-08 NOTE — Telephone Encounter (Signed)
PRIOR AUTHORIZATION  PA initiation date: 05/08/19   Insurance Company: Youth worker completed electronically through [employer] My Meds: Yes  Will await insurance response re: approval/denial.   APPROVAL  Received notification from [place] that PA for [place] NOT require a PA as it is a covered benefit. Documents have been labeled and placed in scan file for HIM and for our future reference.

## 2019-05-10 MED FILL — GABAPENTIN 100 MG CAPSULE: 100 | 90 days supply | Qty: 180 | Fill #1

## 2019-05-16 ENCOUNTER — Other Ambulatory Visit: Payer: Self-pay

## 2019-05-16 ENCOUNTER — Ambulatory Visit (INDEPENDENT_AMBULATORY_CARE_PROVIDER_SITE_OTHER): Payer: 59 | Admitting: Endocrinology

## 2019-05-16 ENCOUNTER — Encounter: Payer: Self-pay | Admitting: Endocrinology

## 2019-05-16 VITALS — BP 120/70 | HR 80 | Ht 73.0 in | Wt 261.4 lb

## 2019-05-16 DIAGNOSIS — Z794 Long term (current) use of insulin: Secondary | ICD-10-CM

## 2019-05-16 DIAGNOSIS — H04123 Dry eye syndrome of bilateral lacrimal glands: Secondary | ICD-10-CM | POA: Diagnosis not present

## 2019-05-16 DIAGNOSIS — E113393 Type 2 diabetes mellitus with moderate nonproliferative diabetic retinopathy without macular edema, bilateral: Secondary | ICD-10-CM | POA: Diagnosis not present

## 2019-05-16 DIAGNOSIS — E119 Type 2 diabetes mellitus without complications: Secondary | ICD-10-CM

## 2019-05-16 LAB — HM DIABETES EYE EXAM

## 2019-05-16 MED ORDER — METFORMIN HCL ER 500 MG PO TB24: 1500.0000 mg | ORAL_TABLET | Freq: Every day | ORAL | 3 refills | Status: DC

## 2019-05-16 NOTE — Patient Instructions (Addendum)
I have sent a prescription to your pharmacy, to reduce the metformin, and: Please continue the same other diabetes medications.   check your blood sugar twice a day.  vary the time of day when you check, between before the 3 meals, and at bedtime.  also check if you have symptoms of your blood sugar being too high or too low.  please keep a record of the readings and bring it to your next appointment here (or you can bring the meter itself).  You can write it on any piece of paper.  please call us sooner if your blood sugar goes below 70, or if you have a lot of readings over 200. Please come back for a follow-up appointment in 2-3 months.

## 2019-05-16 NOTE — Progress Notes (Signed)
Subjective:    Patient ID: James Garcia, male    DOB: 1955-08-15, 64 y.o.   MRN: [id number]  HPI Pt returns for f/u of diabetes mellitus: DM type: Insulin-requiring type 2 Dx'ed: 4098 Complications: polyneuropathy and DR.   Therapy: insulin since 1191, Trulicity, and metformin.   DKA: never Severe hypoglycemia: never.  Pancreatitis: never.   SDOH: he works 2nd shift. Other: he declines multiple daily injections; QD insulin was changed to levemir, due to pattern of cbg's; He stopped farxiga, due to itching.   Interval history: Pt says he never misses the insulin.  He brings his meter with his cbg's which I have reviewed today.  cbg's vary from 90-324.  There is no trend throughout the day.  No recent steroids.  pt states he feels well in general, except for slight nausea.   Past Medical History:  Diagnosis Date  . Constipation    uses OTC laxatives - hard stools every morning- small amount and feels like doesnt empty   . Diabetes mellitus without complication ([place])   . DM type 2 (diabetes mellitus, type 2) ([place])   . Hyperlipidemia   . Hypertension   . Leg cramps   . 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 Tuberculosis    pt states was treated for TB    Past Surgical History:  Procedure Laterality Date  . arm surgery    . COLONOSCOPY    . ELBOW SURGERY    . FOOT SURGERY Bilateral   . POLYPECTOMY      Social History   Socioeconomic History  . Marital status: Single    Spouse name: Not on file  . Number of children: Not on file  . Years of education: Not on file  . Highest education level: Not on file  Occupational History  . Occupation: Engineer, maintenance (IT) at [employer]  . Smoking status: Former Smoker    Packs/day: 0.25    Years: 5.00    Pack years: 1.25    Types: Cigarettes    Quit date: 06/04/1978    Years since quitting: 40.9  . Smokeless tobacco: Never Used  Substance and Sexual Activity  . Alcohol use: No  . Drug use: No  . Sexual activity: Not on file   Other Topics Concern  . Not on file  Social History Narrative   ** Merged History Encounter **       Social Determinants of Health   Financial Resource Strain:   . Difficulty of Paying Living Expenses: Not on file  Food Insecurity:   . Worried About Charity fundraiser in the Last Year: Not on file  . Ran Out of Food in the Last Year: Not on file  Transportation Needs:   . Lack of Transportation (Medical): Not on file  . Lack of Transportation (Non-Medical): Not on file  Physical Activity:   . Days of Exercise per Week: Not on file  . Minutes of Exercise per Session: Not on file  Stress:   . Feeling of Stress : Not on file  Social Connections:   . Frequency of Communication with Friends and Family: Not on file  . Frequency of Social Gatherings with Friends and Family: Not on file  . Attends Religious Services: Not on file  . Active Member of Clubs or Organizations: Not on file  . Attends Archivist Meetings: Not on file  . Marital Status: Not on file  Intimate Partner Violence:   .  Fear of Current or Ex-Partner: Not on file  . Emotionally Abused: Not on file  . Physically Abused: Not on file  . Sexually Abused: Not on file    Current Outpatient Medications on File Prior to Visit  Medication Sig Dispense Refill  . albuterol (PROVENTIL HFA;VENTOLIN HFA) 108 (90 Base) MCG/ACT inhaler Inhale 1-2 puffs into the lungs every 4 (four) hours as needed for wheezing or shortness of breath. 1 Inhaler 0  . amLODipine (NORVASC) 5 MG tablet TAKE 1 TABLET BY MOUTH DAILY. 90 tablet 1  . atorvastatin (LIPITOR) 20 MG tablet TAKE 1 TABLET BY MOUTH DAILY. 90 tablet 1  . Blood Glucose Monitoring Suppl (FREESTYLE LITE) DEVI 1 kit by Does not apply route daily. Use to test blood sugars daily 1 each 0  . Blood Pressure Monitor KIT Use as instructed for HTN 1 each 0  . cyclobenzaprine (FLEXERIL) 5 MG tablet Take 1 tablet (5 mg total) by mouth at bedtime. 7 tablet 0  . gabapentin  (NEURONTIN) 100 MG capsule TAKE 1 CAPSULE BY MOUTH 2 TIMES A DAY 180 capsule 2  . glucose blood (FREESTYLE LITE) test strip USE TO CHECK BLOOD SUGAR TWICE A DAY 200 strip 0  . Insulin Glargine (BASAGLAR KWIKPEN) 100 UNIT/ML SOPN Inject 0.6 mLs (60 Units total) into the skin every morning. And pen needles 2/day 20 pen 3  . Lancets (FREESTYLE) lancets USE TO CHECK BLOOD SUGAR 2 TIMES PER DAY. 200 each 2  . losartan (COZAAR) 100 MG tablet Take 1 tablet (100 mg total) by mouth daily. 90 tablet 0  . omeprazole (PRILOSEC) 20 MG capsule TAKE 1 CAPSULE (20 MG TOTAL) BY MOUTH EVERY MORNING. 90 capsule 0  . Semaglutide, 1 MG/DOSE, (OZEMPIC, 1 MG/DOSE,) 2 MG/1.5ML SOPN Inject 1 mg into the skin once a week. 6 pen 3   Current Facility-Administered Medications on File Prior to Visit  Medication Dose Route Frequency Provider Last Rate Last Admin  . 0.9 %  sodium chloride infusion  500 mL Intravenous Once Pyrtle, Lajuan Lines, MD        No Known Allergies  Family History  Problem Relation Age of Onset  . Diabetes Father   . Colon cancer Neg Hx   . Colon polyps Neg Hx   . Rectal cancer Neg Hx   . Stomach cancer Neg Hx     BP 120/70 (BP Location: Left Arm, Patient Position: Sitting, Cuff Size: Large)   Pulse 80   Ht '6\' 1"'  (1.854 m)   Wt 261 lb 6.4 oz (118.6 kg)   SpO2 96%   BMI 34.49 kg/m    Review of Systems He denies hypoglycemia.     Objective:   Physical Exam VITAL SIGNS:  See vs page GENERAL: no distress Pulses: dorsalis pedis intact bilat.   MSK: no deformity of the feet CV: no leg edema Skin:  no ulcer on the feet.  normal color and temp on the feet. Neuro: sensation is intact to touch on the feet.     Lab Results  Component Value Date   HGBA1C 7.2 (A) 04/04/2019   Lab Results  Component Value Date   CREATININE 0.90 12/06/2018   BUN 12 12/06/2018   NA 139 12/06/2018   K 4.9 12/06/2018   CL 100 12/06/2018   CO2 26 12/06/2018       Assessment & Plan:  Insulin-requiring  type 2 DM, with DR: this is the best control this pt should aim for, given this regimen, which  does match insulin to his changing needs throughout the day Nausea, new: poss due to metformin.   Patient Instructions  I have sent a prescription to your pharmacy, to reduce the metformin, and: Please continue the same other diabetes medications.   check your blood sugar twice a day.  vary the time of day when you check, between before the 3 meals, and at bedtime.  also check if you have symptoms of your blood sugar being too high or too low.  please keep a record of the readings and bring it to your next appointment here (or you can bring the meter itself).  You can write it on any piece of paper.  please call us sooner if your blood sugar goes below 70, or if you have a lot of readings over 200. Please come back for a follow-up appointment in 2-3 months.

## 2019-05-17 ENCOUNTER — Encounter: Payer: Self-pay | Admitting: *Deleted

## 2019-05-29 MED FILL — BASAGLAR 100 UNIT/ML KWIKPE: 100 | 25 days supply | Qty: 15 | Fill #1

## 2019-05-29 MED FILL — OZEMPIC 1 MG/DOSE SOPN: 2 | 28 days supply | Qty: 3 | Fill #2

## 2019-06-11 MED FILL — UNIFINE PENTIPS 8MM 31G: 31G X 8 MM | 50 days supply | Qty: 100 | Fill #0

## 2019-06-13 ENCOUNTER — Other Ambulatory Visit: Payer: Self-pay | Admitting: Emergency Medicine

## 2019-06-13 DIAGNOSIS — I10 Essential (primary) hypertension: Secondary | ICD-10-CM

## 2019-06-13 DIAGNOSIS — E785 Hyperlipidemia, unspecified: Secondary | ICD-10-CM

## 2019-06-13 MED FILL — ATORVASTATIN 20 MG TABLET: 20 | 90 days supply | Qty: 90 | Fill #0

## 2019-06-13 MED FILL — AMLODIPINE BESYLATE 5 MG TA: 5 | 90 days supply | Qty: 90 | Fill #0

## 2019-06-26 MED FILL — BASAGLAR 100 UNIT/ML KWIKPE: 100 | 25 days supply | Qty: 15 | Fill #2

## 2019-06-26 MED FILL — OZEMPIC 1 MG/DOSE SOPN: 2 | 28 days supply | Qty: 3 | Fill #3

## 2019-07-02 ENCOUNTER — Ambulatory Visit: Payer: 59 | Admitting: Endocrinology

## 2019-07-16 MED FILL — METFORMIN HCL ER 500 MG TB2: 500 | 90 days supply | Qty: 360 | Fill #1

## 2019-07-16 MED FILL — BASAGLAR 100 UNIT/ML KWIKPE: 100 | 25 days supply | Qty: 15 | Fill #3

## 2019-07-18 MED FILL — OZEMPIC 1 MG/DOSE SOPN: 2 | 84 days supply | Qty: 9 | Fill #4

## 2019-08-15 MED FILL — BASAGLAR 100 UNIT/ML KWIKPE: 100 | 25 days supply | Qty: 15 | Fill #4

## 2019-08-15 MED FILL — GABAPENTIN 100 MG CAPSULE: 100 | 90 days supply | Qty: 180 | Fill #2

## 2019-08-16 ENCOUNTER — Ambulatory Visit (INDEPENDENT_AMBULATORY_CARE_PROVIDER_SITE_OTHER): Payer: 59 | Admitting: Endocrinology

## 2019-08-16 ENCOUNTER — Other Ambulatory Visit: Payer: Self-pay

## 2019-08-16 ENCOUNTER — Encounter: Payer: Self-pay | Admitting: Endocrinology

## 2019-08-16 VITALS — BP 138/80 | HR 72 | Ht 73.0 in | Wt 270.2 lb

## 2019-08-16 DIAGNOSIS — E119 Type 2 diabetes mellitus without complications: Secondary | ICD-10-CM

## 2019-08-16 DIAGNOSIS — Z794 Long term (current) use of insulin: Secondary | ICD-10-CM

## 2019-08-16 LAB — POCT GLYCOSYLATED HEMOGLOBIN (HGB A1C): Hemoglobin A1C: 7 % — AB (ref 4.0–5.6)

## 2019-08-16 MED ORDER — METFORMIN HCL ER 500 MG PO TB24: 1500.0000 mg | ORAL_TABLET | Freq: Every day | ORAL | 3 refills | Status: DC

## 2019-08-16 NOTE — Patient Instructions (Addendum)
Please reduce the metformin to 3 pills per day, and continue the same other diabetes medications.   check your blood sugar twice a day.  vary the time of day when you check, between before the 3 meals, and at bedtime.  also check if you have symptoms of your blood sugar being too high or too low.  please keep a record of the readings and bring it to your next appointment here (or you can bring the meter itself).  You can write it on any piece of paper.  please call us sooner if your blood sugar goes below 70, or if you have a lot of readings over 200. Please come back for a follow-up appointment in 3-4 months.

## 2019-08-16 NOTE — Progress Notes (Signed)
Subjective:    Patient ID: James Garcia, male    DOB: Jan 29, 1956, 64 y.o.   MRN: [id number]  HPI Pt returns for f/u of diabetes mellitus: DM type: Insulin-requiring type 2 Dx'ed: 7628 Complications: PN and DR.   Therapy: insulin since 2016, Ozempic, and metformin.   DKA: never Severe hypoglycemia: never.   Pancreatitis: never.   SDOH: he works 2nd shift. Other: he declines multiple daily injections; He stopped farxiga, due to itching.   Interval history: Pt says he never misses the insulin.  no cbg record, but states cbg's vary from 72-270.  There is no trend throughout the day.  No recent steroids.   Past Medical History:  Diagnosis Date  . Constipation    uses OTC laxatives - hard stools every morning- small amount and feels like doesnt empty   . Diabetes mellitus without complication ([place])   . DM type 2 (diabetes mellitus, type 2) ([place])   . Hyperlipidemia   . Hypertension   . Leg cramps   . 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 Tuberculosis    pt states was treated for TB    Past Surgical History:  Procedure Laterality Date  . arm surgery    . COLONOSCOPY    . ELBOW SURGERY    . FOOT SURGERY Bilateral   . POLYPECTOMY      Social History   Socioeconomic History  . Marital status: Single    Spouse name: Not on file  . Number of children: Not on file  . Years of education: Not on file  . Highest education level: Not on file  Occupational History  . Occupation: Engineer, maintenance (IT) at [employer]  . Smoking status: Former Smoker    Packs/day: 0.25    Years: 5.00    Pack years: 1.25    Types: Cigarettes    Quit date: 06/04/1978    Years since quitting: 41.2  . Smokeless tobacco: Never Used  Substance and Sexual Activity  . Alcohol use: No  . Drug use: No  . Sexual activity: Not on file  Other Topics Concern  . Not on file  Social History Narrative   ** Merged History Encounter **       Social Determinants of Health   Financial Resource Strain:   .  Difficulty of Paying Living Expenses:   Food Insecurity:   . Worried About Charity fundraiser in the Last Year:   . Arboriculturist in the Last Year:   Transportation Needs:   . Film/video editor (Medical):   Marland Kitchen Lack of Transportation (Non-Medical):   Physical Activity:   . Days of Exercise per Week:   . Minutes of Exercise per Session:   Stress:   . Feeling of Stress :   Social Connections:   . Frequency of Communication with Friends and Family:   . Frequency of Social Gatherings with Friends and Family:   . Attends Religious Services:   . Active Member of Clubs or Organizations:   . Attends Archivist Meetings:   Marland Kitchen Marital Status:   Intimate Partner Violence:   . Fear of Current or Ex-Partner:   . Emotionally Abused:   Marland Kitchen Physically Abused:   . Sexually Abused:     Current Outpatient Medications on File Prior to Visit  Medication Sig Dispense Refill  . albuterol (PROVENTIL HFA;VENTOLIN HFA) 108 (90 Base) MCG/ACT inhaler Inhale 1-2 puffs into the lungs every 4 (four)  hours as needed for wheezing or shortness of breath. 1 Inhaler 0  . amLODipine (NORVASC) 5 MG tablet TAKE 1 TABLET BY MOUTH ONCE A DAY 90 tablet 0  . atorvastatin (LIPITOR) 20 MG tablet TAKE 1 TABLET BY MOUTH ONCE A DAY 90 tablet 0  . Blood Glucose Monitoring Suppl (FREESTYLE LITE) DEVI 1 kit by Does not apply route daily. Use to test blood sugars daily 1 each 0  . Blood Pressure Monitor KIT Use as instructed for HTN 1 each 0  . cyclobenzaprine (FLEXERIL) 5 MG tablet Take 1 tablet (5 mg total) by mouth at bedtime. 7 tablet 0  . gabapentin (NEURONTIN) 100 MG capsule TAKE 1 CAPSULE BY MOUTH 2 TIMES A DAY 180 capsule 2  . glucose blood (FREESTYLE LITE) test strip USE TO CHECK BLOOD SUGAR TWICE A DAY 200 strip 0  . Insulin Glargine (BASAGLAR KWIKPEN) 100 UNIT/ML SOPN Inject 0.6 mLs (60 Units total) into the skin every morning. And pen needles 2/day 20 pen 3  . Lancets (FREESTYLE) lancets USE TO CHECK BLOOD  SUGAR 2 TIMES PER DAY. 200 each 2  . losartan (COZAAR) 100 MG tablet Take 1 tablet (100 mg total) by mouth daily. 90 tablet 0  . omeprazole (PRILOSEC) 20 MG capsule TAKE 1 CAPSULE (20 MG TOTAL) BY MOUTH EVERY MORNING. 90 capsule 0  . Semaglutide, 1 MG/DOSE, (OZEMPIC, 1 MG/DOSE,) 2 MG/1.5ML SOPN Inject 1 mg into the skin once a week. 6 pen 3   Current Facility-Administered Medications on File Prior to Visit  Medication Dose Route Frequency Provider Last Rate Last Admin  . 0.9 %  sodium chloride infusion  500 mL Intravenous Once Pyrtle, Lajuan Lines, MD        No Known Allergies  Family History  Problem Relation Age of Onset  . Diabetes Father   . Colon cancer Neg Hx   . Colon polyps Neg Hx   . Rectal cancer Neg Hx   . Stomach cancer Neg Hx     BP 138/80 (BP Location: Left Arm, Patient Position: Sitting, Cuff Size: Large)   Pulse 72   Ht _0  (1.854 m)   Wt 270 lb 3.2 oz (122.6 kg)   SpO2 98%   BMI 35.65 kg/m    Review of Systems He denies hypoglycemia.  He takes metformin, 4x500 mg qd.  He says this causes nausea.    Objective:   Physical Exam VITAL SIGNS:  See vs page GENERAL: no distress Pulses: dorsalis pedis intact bilat.   MSK: no deformity of the feet CV: trace bilat leg edema Skin:  no ulcer on the feet.  normal color and temp on the feet. Neuro: sensation is intact to touch on the feet  Lab Results  Component Value Date   HGBA1C 7.0 (A) 08/16/2019       Assessment & Plan:  Insulin-requiring type 2 DM: well-controlled Nausea: side effect of metformin.  Patient Instructions  Please reduce the metformin to 3 pills per day, and continue the same other diabetes medications.   check your blood sugar twice a day.  vary the time of day when you check, between before the 3 meals, and at bedtime.  also check if you have symptoms of your blood sugar being too high or too low.  please keep a record of the readings and bring it to your next appointment here (or you can  bring the meter itself).  You can write it on any piece of paper.  please call  us sooner if your blood sugar goes below 70, or if you have a lot of readings over 200. Please come back for a follow-up appointment in 3-4 months.

## 2019-08-29 ENCOUNTER — Encounter (HOSPITAL_COMMUNITY): Payer: Self-pay

## 2019-08-29 ENCOUNTER — Ambulatory Visit (HOSPITAL_COMMUNITY)
Admission: EM | Admit: 2019-08-29 | Discharge: 2019-08-29 | Disposition: A | Discharge status: Home / Self Care | Payer: 59 | Attending: Emergency Medicine | Admitting: Emergency Medicine

## 2019-08-29 ENCOUNTER — Other Ambulatory Visit: Payer: Self-pay

## 2019-08-29 DIAGNOSIS — J069 Acute upper respiratory infection, unspecified: Secondary | ICD-10-CM | POA: Diagnosis not present

## 2019-08-29 DIAGNOSIS — Z79899 Other long term (current) drug therapy: Secondary | ICD-10-CM | POA: Diagnosis not present

## 2019-08-29 DIAGNOSIS — Z833 Family history of diabetes mellitus: Secondary | ICD-10-CM | POA: Diagnosis not present

## 2019-08-29 DIAGNOSIS — K5900 Constipation, unspecified: Secondary | ICD-10-CM | POA: Diagnosis not present

## 2019-08-29 DIAGNOSIS — Z87891 Personal history of nicotine dependence: Secondary | ICD-10-CM | POA: Diagnosis not present

## 2019-08-29 DIAGNOSIS — D696 Thrombocytopenia, unspecified: Secondary | ICD-10-CM | POA: Diagnosis not present

## 2019-08-29 DIAGNOSIS — Z794 Long term (current) use of insulin: Secondary | ICD-10-CM | POA: Insufficient documentation

## 2019-08-29 DIAGNOSIS — E785 Hyperlipidemia, unspecified: Secondary | ICD-10-CM | POA: Insufficient documentation

## 2019-08-29 DIAGNOSIS — E119 Type 2 diabetes mellitus without complications: Secondary | ICD-10-CM | POA: Insufficient documentation

## 2019-08-29 DIAGNOSIS — Z20822 Contact with and (suspected) exposure to covid-19: Secondary | ICD-10-CM | POA: Insufficient documentation

## 2019-08-29 DIAGNOSIS — I10 Essential (primary) hypertension: Secondary | ICD-10-CM | POA: Diagnosis not present

## 2019-08-29 MED ORDER — BENZONATATE 200 MG PO CAPS: 200.0000 mg | ORAL_CAPSULE | Freq: Three times a day (TID) | ORAL | 0 refills | Status: AC | PRN

## 2019-08-29 MED ORDER — FLUTICASONE PROPIONATE 50 MCG/ACT NA SUSP: 1.0000 | Freq: Every day | NASAL | 0 refills | Status: DC

## 2019-08-29 MED ORDER — CETIRIZINE HCL 10 MG PO CAPS: 10.0000 mg | ORAL_CAPSULE | Freq: Every day | ORAL | 0 refills | Status: DC

## 2019-08-29 NOTE — ED Triage Notes (Signed)
Pt presents today for body aches, headaches, fatigue, cough, runny nose, hot flashes. Pt denies fever or chills. Pt denies sick contacts. Pt denies OTC treatments or relieving factors.Pt requesting covid testing at this time.

## 2019-08-29 NOTE — Discharge Instructions (Addendum)
Covid test pending, monitor my chart for results Please use Tessalon/benzonatate every 8 hours as needed for cough Begin daily cetirizine and Flonase nasal spray to further help with congestion drainage, sneezing Rest and drink plenty of fluids Please follow-up if symptoms not improving over the next week, worsening, developing fevers or difficulty breathing  Constipation: Please use Miralax for moderate to severe constipation. Take this once a day for the next 2-3 days. Please also start docusate stool softener, twice a day for at least 1 week. If stools become loose, cut down to once a day for another week. If stools remain loose, cut back to 1 pill every other day for a third week. You can stop docusate thereafter and resume as needed for constipation.  To help reduce constipation and promote bowel health: 1. Drink at least 64 ounces of water each day 2. Eat plenty of fiber (fruits, vegetables, whole grains, legumes) 3. Be physically active or exercise including walking, jogging, swimming, yoga, etc. 4. For active constipation use a stool softener (docusate) or an osmotic laxative (like Miralax) each day, or as needed.

## 2019-08-30 LAB — SARS CORONAVIRUS 2 (TAT 6-24 HRS): SARS Coronavirus 2: NEGATIVE

## 2019-08-30 NOTE — ED Provider Notes (Signed)
[facility]    CSN: [id number] Arrival date & time: 08/29/19  1606      History   Chief Complaint Chief Complaint  Patient presents with  . Generalized Body Aches    HPI JAISHON KRISHER is a 64 y.o. male history of DM type II, presenting today for evaluation of body aches, headaches, fatigue and URI symptoms.  Patient reports over the past 3 days he has had cough congestion as well as feeling tired and under the weather.  He denies any fevers or chills.  Denies any GI symptoms.  He is not used any over-the-counter medicines for symptoms.  Denies chest pain or shortness of breath.  Works at [employer].  He also has endorsed constipation of recently.  Often will go every 3 days.  HPI  Past Medical History:  Diagnosis Date  . Constipation    uses OTC laxatives - hard stools every morning- small amount and feels like doesnt empty   . Diabetes mellitus without complication ([place])   . DM type 2 (diabetes mellitus, type 2) ([place])   . Hyperlipidemia   . Hypertension   . Leg cramps   . 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 Tuberculosis    pt states was treated for TB    Patient Active Problem List   Diagnosis Date Noted  . Diabetes ([place]) 05/01/2015  . Thrombocytopenia ([place]) 06/04/2014    Past Surgical History:  Procedure Laterality Date  . arm surgery    . COLONOSCOPY    . ELBOW SURGERY    . FOOT SURGERY Bilateral   . POLYPECTOMY         Home Medications    Prior to Admission medications   Medication Sig Start Date End Date Taking? Authorizing Provider  amLODipine (NORVASC) 5 MG tablet TAKE 1 TABLET BY MOUTH ONCE A DAY 06/13/19  Yes Sagardia, Ines Bloomer, MD  atorvastatin (LIPITOR) 20 MG tablet TAKE 1 TABLET BY MOUTH ONCE A DAY 06/13/19  Yes Sagardia, Ines Bloomer, MD  Blood Glucose Monitoring Suppl (FREESTYLE LITE) DEVI 1 kit by Does not apply route daily. Use to test blood sugars daily 04/14/18  Yes Renato Shin, MD  gabapentin (NEURONTIN) 100 MG capsule TAKE 1  CAPSULE BY MOUTH 2 TIMES A DAY 02/12/19  Yes Sagardia, [place], MD  glucose blood (FREESTYLE LITE) test strip USE TO CHECK BLOOD SUGAR TWICE A DAY 05/08/19  Yes Renato Shin, MD  Insulin Glargine (BASAGLAR KWIKPEN) 100 UNIT/ML SOPN Inject 0.6 mLs (60 Units total) into the skin every morning. And pen needles 2/day 05/07/19  Yes Renato Shin, MD  Lancets (FREESTYLE) lancets USE TO CHECK BLOOD SUGAR 2 TIMES PER DAY. 11/02/17  Yes Renato Shin, MD  losartan (COZAAR) 100 MG tablet Take 1 tablet (100 mg total) by mouth daily. 11/30/17  Yes Wendie Agreste, MD  metFORMIN (GLUCOPHAGE-XR) 500 MG 24 hr tablet Take 3 tablets (1,500 mg total) by mouth daily. 08/16/19  Yes Renato Shin, MD  omeprazole (PRILOSEC) 20 MG capsule TAKE 1 CAPSULE (20 MG TOTAL) BY MOUTH EVERY MORNING. 03/27/19  Yes Sagardia, Ines Bloomer, MD  Semaglutide, 1 MG/DOSE, (OZEMPIC, 1 MG/DOSE,) 2 MG/1.5ML SOPN Inject 1 mg into the skin once a week. 04/04/19  Yes Renato Shin, MD  benzonatate (TESSALON) 200 MG capsule Take 1 capsule (200 mg total) by mouth 3 (three) times daily as needed for up to 7 days for cough. 08/29/19 09/05/19  Quirino Kakos C, PA-C  Blood Pressure Monitor KIT Use  as instructed for HTN 07/12/17   Wendie Agreste, MD  Cetirizine HCl 10 MG CAPS Take 1 capsule (10 mg total) by mouth daily for 10 days. 08/29/19 09/08/19  Nastasia Kage C, PA-C  cyclobenzaprine (FLEXERIL) 5 MG tablet Take 1 tablet (5 mg total) by mouth at bedtime. 05/08/18   Robyn Haber, MD  fluticasone (FLONASE) 50 MCG/ACT nasal spray Place 1-2 sprays into both nostrils daily for 7 days. 08/29/19 09/05/19  Jolita Haefner C, PA-C  albuterol (PROVENTIL HFA;VENTOLIN HFA) 108 (90 Base) MCG/ACT inhaler Inhale 1-2 puffs into the lungs every 4 (four) hours as needed for wheezing or shortness of breath. 12/24/17 08/29/19  Wendie Agreste, MD    Family History Family History  Problem Relation Age of Onset  . Diabetes Father   . Colon cancer Neg Hx   . Colon  polyps Neg Hx   . Rectal cancer Neg Hx   . Stomach cancer Neg Hx     Social History Social History   Tobacco Use  . Smoking status: Former Smoker    Packs/day: 0.25    Years: 5.00    Pack years: 1.25    Types: Cigarettes    Quit date: 06/04/1978    Years since quitting: 41.2  . Smokeless tobacco: Never Used  Vaping Use  . Vaping Use: Never used  Substance Use Topics  . Alcohol use: No  . Drug use: No     Allergies   Patient has no known allergies.   Review of Systems Review of Systems  Constitutional: Positive for fatigue. Negative for activity change, appetite change, chills and fever.  HENT: Positive for congestion and rhinorrhea. Negative for ear pain, sinus pressure, sore throat and trouble swallowing.   Eyes: Negative for discharge and redness.  Respiratory: Positive for cough. Negative for chest tightness and shortness of breath.   Cardiovascular: Negative for chest pain.  Gastrointestinal: Negative for abdominal pain, diarrhea, nausea and vomiting.  Musculoskeletal: Negative for myalgias.  Skin: Negative for rash.  Neurological: Positive for headaches. Negative for dizziness and light-headedness.     Physical Exam Triage Vital Signs ED Triage Vitals  Enc Vitals Group     BP 08/29/19 1811 (!) 142/82     Pulse Rate 08/29/19 1811 80     Resp 08/29/19 1811 18     Temp 08/29/19 1811 97.9 F (36.6 C)     Temp Source 08/29/19 1811 Oral     SpO2 08/29/19 1811 97 %     Weight --      Height --      Head Circumference --      Peak Flow --      Pain Score 08/29/19 1746 6     Pain Loc --      Pain Edu? --      Excl. in [place]? --    No data found.  Updated Vital Signs BP (!) 142/82 (BP Location: Right Arm)   Pulse 80   Temp 97.9 F (36.6 C) (Oral)   Resp 18   SpO2 97%   Visual Acuity Right Eye Distance:   Left Eye Distance:   Bilateral Distance:    Right Eye Near:   Left Eye Near:    Bilateral Near:     Physical Exam Vitals and nursing note  reviewed.  Constitutional:      Appearance: He is well-developed.     Comments: No acute distress  HENT:     Head: Normocephalic and atraumatic.  Ears:     Comments: Bilateral ears without tenderness to palpation of external auricle, tragus and mastoid, EAC's without erythema or swelling, TM's with good bony landmarks and cone of light. Non erythematous.     Nose: Nose normal.     Mouth/Throat:     Comments: Oral mucosa pink and moist, no tonsillar enlargement or exudate. Posterior pharynx patent and nonerythematous, no uvula deviation or swelling. Normal phonation. Eyes:     Conjunctiva/sclera: Conjunctivae normal.  Cardiovascular:     Rate and Rhythm: Normal rate.  Pulmonary:     Effort: Pulmonary effort is normal. No respiratory distress.     Comments: Breathing comfortably at rest, CTABL, no wheezing, rales or other adventitious sounds auscultated Abdominal:     General: There is no distension.  Musculoskeletal:        General: Normal range of motion.     Cervical back: Neck supple.  Skin:    General: Skin is warm and dry.  Neurological:     Mental Status: He is alert and oriented to person, place, and time.      UC Treatments / Results  Labs (all labs ordered are listed, but only abnormal results are displayed) Labs Reviewed  SARS CORONAVIRUS 2 (TAT 6-24 HRS)    EKG   Radiology No results found.  Procedures Procedures (including critical care time)  Medications Ordered in UC Medications - No data to display  Initial Impression / Assessment and Plan / UC Course  I have reviewed the triage vital signs and the nursing notes.  Pertinent labs & imaging results that were available during my care of the patient were reviewed by me and considered in my medical decision making (see chart for details).     URI symptoms x3 days, Covid PCR pending, exam unremarkable, suspect likely viral etiology.  Recommending symptomatic and supportive care.  Rest and  fluids.  Discussed recommendations for constipation including lifestyle modifications and medication OTC for more regular bowel movements.  Discussed strict return precautions. Patient verbalized understanding and is agreeable with plan.  Final Clinical Impressions(s) / UC Diagnoses   Final diagnoses:  Viral URI with cough  Constipation, unspecified constipation type     Discharge Instructions     Covid test pending, monitor my chart for results Please use Tessalon/benzonatate every 8 hours as needed for cough Begin daily cetirizine and Flonase nasal spray to further help with congestion drainage, sneezing Rest and drink plenty of fluids Please follow-up if symptoms not improving over the next week, worsening, developing fevers or difficulty breathing  Constipation: Please use Miralax for moderate to severe constipation. Take this once a day for the next 2-3 days. Please also start docusate stool softener, twice a day for at least 1 week. If stools become loose, cut down to once a day for another week. If stools remain loose, cut back to 1 pill every other day for a third week. You can stop docusate thereafter and resume as needed for constipation.  To help reduce constipation and promote bowel health: 1. Drink at least 64 ounces of water each day 2. Eat plenty of fiber (fruits, vegetables, whole grains, legumes) 3. Be physically active or exercise including walking, jogging, swimming, yoga, etc. 4. For active constipation use a stool softener (docusate) or an osmotic laxative (like Miralax) each day, or as needed.    ED Prescriptions    Medication Sig Dispense Auth. Provider   benzonatate (TESSALON) 200 MG capsule Take 1 capsule (200 mg total) by  mouth 3 (three) times daily as needed for up to 7 days for cough. 28 capsule Ezri Landers C, PA-C   Cetirizine HCl 10 MG CAPS Take 1 capsule (10 mg total) by mouth daily for 10 days. 10 capsule Cipriano Millikan C, PA-C   fluticasone  (FLONASE) 50 MCG/ACT nasal spray Place 1-2 sprays into both nostrils daily for 7 days. 1 g Zaya Kessenich, [place] C, PA-C     PDMP not reviewed this encounter.   Janith Lima, [place] 08/30/19 1257

## 2019-09-04 ENCOUNTER — Ambulatory Visit: Payer: 59 | Admitting: Emergency Medicine

## 2019-09-07 MED FILL — BASAGLAR 100 UNIT/ML KWIKPE: 100 | 25 days supply | Qty: 15 | Fill #5

## 2019-09-13 MED FILL — UNIFINE PENTIPS 8MM 31G: 31G X 8 MM | 50 days supply | Qty: 100 | Fill #1

## 2019-09-17 ENCOUNTER — Other Ambulatory Visit: Payer: Self-pay | Admitting: Emergency Medicine

## 2019-09-17 DIAGNOSIS — I10 Essential (primary) hypertension: Secondary | ICD-10-CM

## 2019-09-17 DIAGNOSIS — E785 Hyperlipidemia, unspecified: Secondary | ICD-10-CM

## 2019-09-18 MED FILL — AMLODIPINE BESYLATE 5 MG TA: 5 | 90 days supply | Qty: 90 | Fill #0

## 2019-09-18 MED FILL — ATORVASTATIN 20 MG TABLET: 20 | 90 days supply | Qty: 90 | Fill #0

## 2019-09-18 NOTE — Telephone Encounter (Signed)
Requested medication (s) are due for refill today:   Yes for both meds  Requested medication (s) are on the active medication list:   Yes for both meds  Future visit scheduled:   No LOV 03/06/2019   Last ordered: 06/13/2019 #90 with 0 refills for the atorvastatin and Norvasc.  Returned because does not have a valid encounter within the last 6 months.    [facility] does not schedule for this practice.  Pt needs 6 mo. Appt.   Requested Prescriptions  Pending Prescriptions Disp Refills   atorvastatin (LIPITOR) 20 MG tablet [Pharmacy Med Name: ATORVASTATIN 20 MG TABLET 20 Tablet] 90 tablet 0    Sig: TAKE 1 TABLET BY MOUTH ONCE A DAY      Cardiovascular:  Antilipid - Statins Failed - 09/17/2019 11:31 AM      Failed - LDL in normal range and within 360 days    LDL Chol Calc (NIH)  Date Value Ref Range Status  12/06/2018 88 0 - 99 mg/dL Final          Passed - Total Cholesterol in normal range and within 360 days    Cholesterol, Total  Date Value Ref Range Status  12/06/2018 153 100 - 199 mg/dL Final          Passed - HDL in normal range and within 360 days    HDL  Date Value Ref Range Status  12/06/2018 51 >39 mg/dL Final          Passed - Triglycerides in normal range and within 360 days    Triglycerides  Date Value Ref Range Status  12/06/2018 73 0 - 149 mg/dL Final          Passed - Patient is not pregnant      Passed - Valid encounter within last 12 months    Recent Outpatient Visits           6 months ago Hypertension associated with type 2 diabetes mellitus ([place])   [facility], [place], MD   9 months ago Hypertension associated with type 2 diabetes mellitus [facility])   [facility], Ines Bloomer, MD   1 year ago Cough   [facility], Ranell Patrick, MD   1 year ago Essential hypertension   [facility], Ranell Patrick, MD   2 years ago Other polyneuropathy   [facility], Ranell Patrick, MD                 amLODipine (NORVASC) 5 MG tablet [Pharmacy Med Name: AMLODIPINE BESYLATE 5 MG TA 5 Tablet] 90 tablet 0    Sig: TAKE 1 TABLET BY MOUTH ONCE A DAY      Cardiovascular:  Calcium Channel Blockers Failed - 09/17/2019 11:31 AM      Failed - Last BP in normal range    BP Readings from Last 1 Encounters:  08/29/19 (!) 142/82          Failed - Valid encounter within last 6 months    Recent Outpatient Visits           6 months ago Hypertension associated with type 2 diabetes mellitus [facility])   [facility] [place], [place], MD   9 months ago Hypertension associated with type 2 diabetes mellitus [facility])   [facility] at [facility], Ines Bloomer, MD   1 year ago Cough   [facility], [place]  R, MD   1 year ago Essential hypertension   [facility], Ranell Patrick, MD   2 years ago Other polyneuropathy   [facility], Ranell Patrick, MD

## 2019-09-20 ENCOUNTER — Encounter (HOSPITAL_COMMUNITY): Payer: Self-pay

## 2019-09-20 ENCOUNTER — Ambulatory Visit (HOSPITAL_COMMUNITY)
Admission: EM | Admit: 2019-09-20 | Discharge: 2019-09-20 | Disposition: A | Discharge status: Home / Self Care | Payer: Worker's Compensation | Attending: Family Medicine | Admitting: Family Medicine

## 2019-09-20 ENCOUNTER — Other Ambulatory Visit: Payer: Self-pay

## 2019-09-20 DIAGNOSIS — M79605 Pain in left leg: Secondary | ICD-10-CM | POA: Diagnosis not present

## 2019-09-20 DIAGNOSIS — W19XXXA Unspecified fall, initial encounter: Secondary | ICD-10-CM

## 2019-09-20 MED ORDER — BACITRACIN-NEOMYCIN-POLYMYXIN 400-5-5000 EX OINT
1.0000 | TOPICAL_OINTMENT | Freq: Two times a day (BID) | CUTANEOUS | 0 refills | Status: AC
Start: 2019-09-20 — End: ?

## 2019-09-20 NOTE — ED Triage Notes (Addendum)
Pt c/o fall at work. Pt tripped over a cart and injured left knee. Pt has abrasion on left knee and left lower leg. Pt c/o 6/10 burning pain in left knee when walking. Pt was able to walk to exam room. Pt felt a "charley horse" in left calf when he fell. Pt states hit back of head when he fell. Pt denies blood thinners and denies LOC.

## 2019-09-20 NOTE — ED Provider Notes (Signed)
James Garcia    CSN: [id number] Arrival date & time: 09/20/19  7341      History   Chief Complaint Chief Complaint  Patient presents with  . Fall    HPI James Garcia is a 64 y.o. male.   He is presenting with left leg pain.  He had a fall earlier today at work.  He is presenting with abrasions over the anterior aspect of his leg and having some posterior calf pain.  Denies any numbness or tingling.  History of surgery in the leg.  Has a history of diabetes with paresthesias in his lower leg.  HPI  Past Medical History:  Diagnosis Date  . Constipation    uses OTC laxatives - hard stools every morning- small amount and feels like doesnt empty   . Diabetes mellitus without complication ([place])   . DM type 2 (diabetes mellitus, type 2) ([place])   . Hyperlipidemia   . Hypertension   . Leg cramps   . 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 Tuberculosis    pt states was treated for TB    Patient Active Problem List   Diagnosis Date Noted  . Diabetes ([place]) 05/01/2015  . Thrombocytopenia ([place]) 06/04/2014    Past Surgical History:  Procedure Laterality Date  . arm surgery    . COLONOSCOPY    . ELBOW SURGERY    . FOOT SURGERY Bilateral   . POLYPECTOMY         Home Medications    Prior to Admission medications   Medication Sig Start Date End Date Taking? Authorizing Provider  amLODipine (NORVASC) 5 MG tablet TAKE 1 TABLET BY MOUTH ONCE A DAY 09/18/19   Horald Pollen, MD  atorvastatin (LIPITOR) 20 MG tablet TAKE 1 TABLET BY MOUTH ONCE A DAY 09/18/19   Horald Pollen, MD  Blood Glucose Monitoring Suppl (FREESTYLE LITE) DEVI 1 kit by Does not apply route daily. Use to test blood sugars daily 04/14/18   Renato Shin, MD  Blood Pressure Monitor KIT Use as instructed for HTN 07/12/17   Wendie Agreste, MD  fluticasone [facility]) 50 MCG/ACT nasal spray Place 1-2 sprays into both nostrils daily for 7 days. 08/29/19 09/05/19  Wieters, Hallie C, PA-C  gabapentin  (NEURONTIN) 100 MG capsule TAKE 1 CAPSULE BY MOUTH 2 TIMES A DAY 02/12/19   Sagardia, Lawrence, MD  glucose blood (FREESTYLE LITE) test strip USE TO CHECK BLOOD SUGAR TWICE A DAY 05/08/19   Renato Shin, MD  Insulin Glargine (BASAGLAR KWIKPEN) 100 UNIT/ML SOPN Inject 0.6 mLs (60 Units total) into the skin every morning. And pen needles 2/day 05/07/19   Renato Shin, MD  Lancets (FREESTYLE) lancets USE TO CHECK BLOOD SUGAR 2 TIMES PER DAY. 11/02/17   Renato Shin, MD  losartan (COZAAR) 100 MG tablet Take 1 tablet (100 mg total) by mouth daily. 11/30/17   Wendie Agreste, MD  metFORMIN (GLUCOPHAGE-XR) 500 MG 24 hr tablet Take 3 tablets (1,500 mg total) by mouth daily. 08/16/19   Renato Shin, MD  neomycin-bacitracin-polymyxin (NEOSPORIN) ointment Apply 1 application topically every 12 (twelve) hours. 09/20/19   Rosemarie Ax, MD  omeprazole (PRILOSEC) 20 MG capsule TAKE 1 CAPSULE (20 MG TOTAL) BY MOUTH EVERY MORNING. 03/27/19   Sagardia, Ines Bloomer, MD  Semaglutide, 1 MG/DOSE, (OZEMPIC, 1 MG/DOSE,) 2 MG/1.5ML SOPN Inject 1 mg into the skin once a week. 04/04/19   Renato Shin, MD  albuterol (PROVENTIL HFA;VENTOLIN HFA) 108 [phone] Base)  MCG/ACT inhaler Inhale 1-2 puffs into the lungs every 4 (four) hours as needed for wheezing or shortness of breath. 12/24/17 08/29/19  Wendie Agreste, MD  Cetirizine HCl 10 MG CAPS Take 1 capsule (10 mg total) by mouth daily for 10 days. 08/29/19 09/20/19  Wieters, Elesa Hacker, PA-C    Family History Family History  Problem Relation Age of Onset  . Diabetes Father   . Colon cancer Neg Hx   . Colon polyps Neg Hx   . Rectal cancer Neg Hx   . Stomach cancer Neg Hx     Social History Social History   Tobacco Use  . Smoking status: Former Smoker    Packs/day: 0.25    Years: 5.00    Pack years: 1.25    Types: Cigarettes    Quit date: 06/04/1978    Years since quitting: 41.3  . Smokeless tobacco: Never Used  Vaping Use  . Vaping Use: Never used  Substance Use  Topics  . Alcohol use: No  . Drug use: No     Allergies   Patient has no known allergies.   Review of Systems Review of Systems  See HPI  Physical Exam Triage Vital Signs ED Triage Vitals  Enc Vitals Group     BP 09/20/19 1955 (!) 120/92     Pulse Rate 09/20/19 1955 79     Resp 09/20/19 1955 16     Temp 09/20/19 1955 98.7 F (37.1 C)     Temp Source 09/20/19 1955 Oral     SpO2 09/20/19 1955 99 %     Weight 09/20/19 1956 270 lb (122.5 kg)     Height 09/20/19 1956 _0  (1.88 m)     Head Circumference --      Peak Flow --      Pain Score 09/20/19 1956 6     Pain Loc --      Pain Edu? --      Excl. in [place]? --    No data found.  Updated Vital Signs BP (!) 120/92   Pulse 79   Temp 98.7 F (37.1 C) (Oral)   Resp 16   Ht _1  (1.88 m)   Wt 122.5 kg   SpO2 99%   BMI 34.67 kg/m   Visual Acuity Right Eye Distance:   Left Eye Distance:   Bilateral Distance:    Right Eye Near:   Left Eye Near:    Bilateral Near:     Physical Exam Gen: NAD, alert, cooperative with exam, well-appearing ENT: normal lips, normal nasal mucosa,  Skin: no rashes, no areas of induration, 3 abrasions over the anterior aspect of the tibia Neuro: normal tone, normal sensation to touch Psych:  normal insight, alert and oriented MSK:  Left lower leg: No ecchymosis or swelling. Normal knee range of motion. Normal ankle range of motion. Tenderness palpation of the gastrocnemius posteriorly. Neurovascular intact   UC Treatments / Results  Labs (all labs ordered are listed, but only abnormal results are displayed) Labs Reviewed - No data to display  EKG   Radiology No results found.  Procedures Procedures (including critical care time)  Medications Ordered in UC Medications - No data to display  Initial Impression / Assessment and Plan / UC Course  I have reviewed the triage vital signs and the nursing notes.  Pertinent labs & imaging results that were available during  my care of the patient were reviewed by me and considered in my medical decision  making (see chart for details).     Mr. James Garcia is a 64 year old male that is presenting with left leg pain after an injury and fall at work.  Concern for calf strain with tenderness in the area.  The abrasions were dressed and counseled on management.  Given indications to follow-up.  Final Clinical Impressions(s) / UC Diagnoses   Final diagnoses:  Fall, initial encounter  Left leg pain     Discharge Instructions     Please change the dressing daily  Please follow up on Monday  Please follow up if your symptoms fail to improve.     ED Prescriptions    Medication Sig Dispense Auth. Provider   neomycin-bacitracin-polymyxin (NEOSPORIN) ointment Apply 1 application topically every 12 (twelve) hours. 15 g Rosemarie Ax, MD     PDMP not reviewed this encounter.   Rosemarie Ax, MD 09/20/19 2200

## 2019-09-20 NOTE — Discharge Instructions (Signed)
Please change the dressing daily  Please follow up on Monday  Please follow up if your symptoms fail to improve.

## 2019-09-24 ENCOUNTER — Ambulatory Visit (INDEPENDENT_AMBULATORY_CARE_PROVIDER_SITE_OTHER): Payer: Worker's Compensation | Admitting: Family Medicine

## 2019-09-24 ENCOUNTER — Ambulatory Visit: Payer: Self-pay

## 2019-09-24 ENCOUNTER — Other Ambulatory Visit: Payer: Self-pay

## 2019-09-24 ENCOUNTER — Encounter: Payer: Self-pay | Admitting: Family Medicine

## 2019-09-24 VITALS — BP 160/93 | HR 76 | Ht 74.0 in | Wt 270.0 lb

## 2019-09-24 DIAGNOSIS — M79605 Pain in left leg: Secondary | ICD-10-CM | POA: Diagnosis not present

## 2019-09-24 MED ORDER — SULFAMETHOXAZOLE-TRIMETHOPRIM 800-160 MG PO TABS
1.0000 | ORAL_TABLET | Freq: Two times a day (BID) | ORAL | 0 refills | Status: DC
Start: 2019-09-24 — End: 2020-09-01

## 2019-09-24 MED FILL — SULFAMETHOXAZOLE-TMP DS TAB: 800-160 | 7 days supply | Qty: 14 | Fill #0

## 2019-09-24 NOTE — Patient Instructions (Signed)
Good to see you Please take the antibiotic  Please send me a message in MyChart with any questions or updates.  Please see me back in 1 week.   --Dr. Raeford Razor

## 2019-09-24 NOTE — Progress Notes (Signed)
James Garcia - 64 y.o. male MRN [id number]  Date of birth: Apr 02, 1955  SUBJECTIVE:  Including CC & ROS.  Chief Complaint  Patient presents with  . Leg Injury    left x 09/20/2019    James Garcia is a 64 y.o. male that is  Presenting with left leg pain. He had an accident at work. He fell and hit his leg. He initially had abrasions but now is having swelling and worsening pain of the lower leg. .    Review of Systems See HPI   HISTORY: Past Medical, Surgical, Social, and Family History Reviewed & Updated per EMR.   Pertinent Historical Findings include:  Past Medical History:  Diagnosis Date  . Constipation    uses OTC laxatives - hard stools every morning- small amount and feels like doesnt empty   . Diabetes mellitus without complication ([place])   . DM type 2 (diabetes mellitus, type 2) ([place])   . Hyperlipidemia   . Hypertension   . Leg cramps   . 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 Tuberculosis    pt states was treated for TB    Past Surgical History:  Procedure Laterality Date  . arm surgery    . COLONOSCOPY    . ELBOW SURGERY    . FOOT SURGERY Bilateral   . POLYPECTOMY      Family History  Problem Relation Age of Onset  . Diabetes Father   . Colon cancer Neg Hx   . Colon polyps Neg Hx   . Rectal cancer Neg Hx   . Stomach cancer Neg Hx     Social History   Socioeconomic History  . Marital status: Single    Spouse name: Not on file  . Number of children: Not on file  . Years of education: Not on file  . Highest education level: Not on file  Occupational History  . Occupation: Engineer, maintenance (IT) at [employer]  . Smoking status: Former Smoker    Packs/day: 0.25    Years: 5.00    Pack years: 1.25    Types: Cigarettes    Quit date: 06/04/1978    Years since quitting: 41.3  . Smokeless tobacco: Never Used  Vaping Use  . Vaping Use: Never used  Substance and Sexual Activity  . Alcohol use: No  . Drug use: No  . Sexual activity: Not on file    Other Topics Concern  . Not on file  Social History Narrative   ** Merged History Encounter **       Social Determinants of Health   Financial Resource Strain:   . Difficulty of Paying Living Expenses:   Food Insecurity:   . Worried About Charity fundraiser in the Last Year:   . Arboriculturist in the Last Year:   Transportation Needs:   . Film/video editor (Medical):   Marland Kitchen Lack of Transportation (Non-Medical):   Physical Activity:   . Days of Exercise per Week:   . Minutes of Exercise per Session:   Stress:   . Feeling of Stress :   Social Connections:   . Frequency of Communication with Friends and Family:   . Frequency of Social Gatherings with Friends and Family:   . Attends Religious Services:   . Active Member of Clubs or Organizations:   . Attends Archivist Meetings:   Marland Kitchen Marital Status:   Intimate Partner Violence:   . Fear of Current  or Ex-Partner:   . Emotionally Abused:   Marland Kitchen Physically Abused:   . Sexually Abused:      PHYSICAL EXAM:  VS: BP (!) 160/93   Pulse 76   Ht 6\' 2"  (1.88 m)   Wt 270 lb (122.5 kg)   BMI 34.67 kg/m  Physical Exam Gen: NAD, alert, cooperative with exam, well-appearing MSK:  Left leg: Redness and swelling of the left distal tibia. Normal knee range of motion. Normal ankle range of motion. Tenderness palpation over the anterior tibial shaft. Neurovascular intact  Limited ultrasound: Left tibia:  No change of the cortex of the tibia. Cobblestoning of the soft tissue to suggest infection. No hematoma present  Summary: Findings suggest soft tissue infection.  Ultrasound and interpretation by Clearance Coots, MD            ASSESSMENT & PLAN:   Left leg pain Injury occurred on 7/8 while at work.  Having worsening pain and swelling over the left distal tibia.  Concern for infection with pain on exam and findings on ultrasound. -Counseled on supportive care. -Bactrim. -Counseled on dressing the  area. -Follow-up in 1 week to monitor. -Provided work note.

## 2019-09-24 NOTE — Assessment & Plan Note (Signed)
Injury occurred on 7/8 while at work.  Having worsening pain and swelling over the left distal tibia.  Concern for infection with pain on exam and findings on ultrasound. -Counseled on supportive care. -Bactrim. -Counseled on dressing the area. -Follow-up in 1 week to monitor. -Provided work note.

## 2019-10-02 MED FILL — BASAGLAR 100 UNIT/ML KWIKPE: 100 | 90 days supply | Qty: 54 | Fill #6

## 2019-10-03 ENCOUNTER — Other Ambulatory Visit: Payer: Self-pay

## 2019-10-03 ENCOUNTER — Encounter: Payer: Self-pay | Admitting: Family Medicine

## 2019-10-03 ENCOUNTER — Ambulatory Visit (INDEPENDENT_AMBULATORY_CARE_PROVIDER_SITE_OTHER): Payer: Self-pay | Admitting: Family Medicine

## 2019-10-03 VITALS — BP 139/81 | Ht 74.0 in | Wt 270.0 lb

## 2019-10-03 DIAGNOSIS — M79605 Pain in left leg: Secondary | ICD-10-CM

## 2019-10-03 NOTE — Progress Notes (Signed)
PCP: Horald Pollen, MD  Subjective:   HPI: Patient is a 64 y.o. male here for left leg injury.  7/12: James Garcia is a 64 y.o. male that is  Presenting with left leg pain. He had an accident at work. He fell and hit his leg. He initially had abrasions but now is having swelling and worsening pain of the lower leg.  7/21: Patient reports overall he is doing well. Completed bactrim without side effects. Had been changing dressings but now wounds to left lower leg have scabbed over so has stopped. Still pain primarily in mid-tibial wound though visibly improving. Not taking anything for pain.  Past Medical History:  Diagnosis Date   Constipation    uses OTC laxatives - hard stools every morning- small amount and feels like doesnt empty    Diabetes mellitus without complication ([place])    DM type 2 (diabetes mellitus, type 2) ([facility])    Hyperlipidemia    Hypertension    Leg cramps    Tuberculin skin test (TST) positive    Tuberculosis    pt states was treated for TB    Current Outpatient Medications on File Prior to Visit  Medication Sig Dispense Refill   amLODipine (NORVASC) 5 MG tablet TAKE 1 TABLET BY MOUTH ONCE A DAY 90 tablet 0   atorvastatin (LIPITOR) 20 MG tablet TAKE 1 TABLET BY MOUTH ONCE A DAY 90 tablet 0   Blood Glucose Monitoring Suppl (FREESTYLE LITE) DEVI 1 kit by Does not apply route daily. Use to test blood sugars daily 1 each 0   Blood Pressure Monitor KIT Use as instructed for HTN 1 each 0   cetirizine (ZYRTEC) 10 MG tablet Take 10 mg by mouth daily.     fluticasone (FLONASE) 50 MCG/ACT nasal spray Place 1-2 sprays into both nostrils daily for 7 days. 1 g 0   gabapentin (NEURONTIN) 100 MG capsule TAKE 1 CAPSULE BY MOUTH 2 TIMES A DAY 180 capsule 2   glucose blood test strip      Insulin Glargine (BASAGLAR KWIKPEN) 100 UNIT/ML SOPN Inject 0.6 mLs (60 Units total) into the skin every morning. And pen needles 2/day 20 pen 3   Lancets  (FREESTYLE) lancets USE TO CHECK BLOOD SUGAR 2 TIMES PER DAY. 200 each 2   losartan (COZAAR) 100 MG tablet Take 1 tablet (100 mg total) by mouth daily. 90 tablet 0   metFORMIN (GLUCOPHAGE-XR) 500 MG 24 hr tablet Take 3 tablets (1,500 mg total) by mouth daily. 270 tablet 3   neomycin-bacitracin-polymyxin (NEOSPORIN) ointment Apply 1 application topically every 12 (twelve) hours. 15 g 0   omeprazole (PRILOSEC) 20 MG capsule TAKE 1 CAPSULE (20 MG TOTAL) BY MOUTH EVERY MORNING. 90 capsule 0   Semaglutide, 1 MG/DOSE, (OZEMPIC, 1 MG/DOSE,) 2 MG/1.5ML SOPN Inject 1 mg into the skin once a week. 6 pen 3   sulfamethoxazole-trimethoprim (BACTRIM DS) 800-160 MG tablet Take 1 tablet by mouth 2 (two) times daily. 14 tablet 0   UNIFINE PENTIPS 31G X 8 MM MISC USE WITH INSULIN TWICE DAILY AS DIRECTED     [DISCONTINUED] albuterol (PROVENTIL HFA;VENTOLIN HFA) 108 (90 Base) MCG/ACT inhaler Inhale 1-2 puffs into the lungs every 4 (four) hours as needed for wheezing or shortness of breath. 1 Inhaler 0   Current Facility-Administered Medications on File Prior to Visit  Medication Dose Route Frequency Provider Last Rate Last Admin   0.9 %  sodium chloride infusion  500 mL Intravenous Once Pyrtle, Lajuan Lines,  MD        Past Surgical History:  Procedure Laterality Date   arm surgery     COLONOSCOPY     ELBOW SURGERY     FOOT SURGERY Bilateral    POLYPECTOMY      No Known Allergies  Social History   Socioeconomic History   Marital status: Single    Spouse name: Not on file   Number of children: Not on file   Years of education: Not on file   Highest education level: Not on file  Occupational History   Occupation: Floor Tech at [employer]   Tobacco Use   Smoking status: Former Smoker    Packs/day: 0.25    Years: 5.00    Pack years: 1.25    Types: Cigarettes    Quit date: 06/04/1978    Years since quitting: 41.3   Smokeless tobacco: Never Used  Vaping Use   Vaping Use: Never used   Substance and Sexual Activity   Alcohol use: No   Drug use: No   Sexual activity: Not on file  Other Topics Concern   Not on file  Social History Narrative   ** Merged History Encounter **       Social Determinants of Health   Financial Resource Strain:    Difficulty of Paying Living Expenses:   Food Insecurity:    Worried About Charity fundraiser in the Last Year:    Arboriculturist in the Last Year:   Transportation Needs:    Film/video editor (Medical):    Lack of Transportation (Non-Medical):   Physical Activity:    Days of Exercise per Week:    Minutes of Exercise per Session:   Stress:    Feeling of Stress :   Social Connections:    Frequency of Communication with Friends and Family:    Frequency of Social Gatherings with Friends and Family:    Attends Religious Services:    Active Member of Clubs or Organizations:    Attends Music therapist:    Marital Status:   Intimate Partner Violence:    Fear of Current or Ex-Partner:    Emotionally Abused:    Physically Abused:    Sexually Abused:     Family History  Problem Relation Age of Onset   Diabetes Father    Colon cancer Neg Hx    Colon polyps Neg Hx    Rectal cancer Neg Hx    Stomach cancer Neg Hx     BP 139/81    Ht 6' 2" (1.88 m)    Wt 270 lb (122.5 kg)    BMI 34.67 kg/m   Review of Systems: See HPI above.     Objective:  Physical Exam:  Gen: NAD, comfortable in exam room  Left lower leg: Three abrasions noted to lower leg - all noted with crusting, surrounding hyperemia without evidence cellulitis.  Crust of mid-tibial wound noted to be dark. FROM with 5/5 strength. Ankle, knee. Mild tenderness to palpation around mid-tibial wound. NVI distally.   MSK u/s left mid-tibia:  No cortical irregularity, edema overlying cortex in area of mid-tibia wound.  Two small hematomas underlying wound.  No neovascularity over tibia.  Assessment & Plan:  1.  Left lower leg injury - multiple abrasions that are healing well.  Still with pain primarily from contusion, hematoma underlying skin mid-tibia.  Will monitor to ensure this continues to heal.  Finished bactrim.  Discussed increased redness, drainage, worsening  pain, fever > 100.39F to call us sooner otherwise f/u in 1 week.

## 2019-10-03 NOTE — Patient Instructions (Signed)
Your wounds are healing well. We will continue to watch the one in the middle but there's no evidence of infection now. You do have a hematoma which is why this is hurting. Tylenol 500mg  1-2 tabs three times a day if needed. You don't need to put dressings on these any longer Follow up with Dr. Raeford Razor or me in 1 week for reevaluation. Call me sooner if pain worsens instead of improves, the redness spreads, you get a fever > 100.52F (these would be unlikely).

## 2019-10-10 ENCOUNTER — Ambulatory Visit (INDEPENDENT_AMBULATORY_CARE_PROVIDER_SITE_OTHER): Payer: Worker's Compensation | Admitting: Family Medicine

## 2019-10-10 ENCOUNTER — Encounter: Payer: Self-pay | Admitting: Family Medicine

## 2019-10-10 ENCOUNTER — Other Ambulatory Visit: Payer: Self-pay

## 2019-10-10 VITALS — BP 163/84 | Ht 74.0 in | Wt 270.0 lb

## 2019-10-10 DIAGNOSIS — M79605 Pain in left leg: Secondary | ICD-10-CM

## 2019-10-10 NOTE — Progress Notes (Signed)
PCP: Horald Pollen, MD  Subjective:   HPI: Patient is a 64 y.o. male here for left leg injury.  7/12: James Garcia is a 65 y.o. male that is  Presenting with left leg pain. He had an accident at work. He fell and hit his leg. He initially had abrasions but now is having swelling and worsening pain of the lower leg.  7/21: Patient reports overall he is doing well. Completed bactrim without side effects. Had been changing dressings but now wounds to left lower leg have scabbed over so has stopped. Still pain primarily in mid-tibial wound though visibly improving. Not taking anything for pain.  7/28: Patient reports he continues to improve. No increased redness, swelling, drainage from his leg wounds. His pain has been improving as well and now only twice a day he gets twinging pain in the midportion of his lower leg. No fevers.  No new complaints.  Past Medical History:  Diagnosis Date  . Constipation    uses OTC laxatives - hard stools every morning- small amount and feels like doesnt empty   . Diabetes mellitus without complication ([place])   . DM type 2 (diabetes mellitus, type 2) ([place])   . Hyperlipidemia   . Hypertension   . Leg cramps   . 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 Tuberculosis    pt states was treated for TB    Current Outpatient Medications on File Prior to Visit  Medication Sig Dispense Refill  . amLODipine (NORVASC) 5 MG tablet TAKE 1 TABLET BY MOUTH ONCE A DAY 90 tablet 0  . atorvastatin (LIPITOR) 20 MG tablet TAKE 1 TABLET BY MOUTH ONCE A DAY 90 tablet 0  . Blood Glucose Monitoring Suppl (FREESTYLE LITE) DEVI 1 kit by Does not apply route daily. Use to test blood sugars daily 1 each 0  . Blood Pressure Monitor KIT Use as instructed for HTN 1 each 0  . cetirizine (ZYRTEC) 10 MG tablet Take 10 mg by mouth daily.    . fluticasone (FLONASE) 50 MCG/ACT nasal spray Place 1-2 sprays into both nostrils daily for 7 days. 1 g 0  . gabapentin (NEURONTIN)  100 MG capsule TAKE 1 CAPSULE BY MOUTH 2 TIMES A DAY 180 capsule 2  . glucose blood test strip     . Insulin Glargine (BASAGLAR KWIKPEN) 100 UNIT/ML SOPN Inject 0.6 mLs (60 Units total) into the skin every morning. And pen needles 2/day 20 pen 3  . Lancets (FREESTYLE) lancets USE TO CHECK BLOOD SUGAR 2 TIMES PER DAY. 200 each 2  . losartan (COZAAR) 100 MG tablet Take 1 tablet (100 mg total) by mouth daily. 90 tablet 0  . metFORMIN (GLUCOPHAGE-XR) 500 MG 24 hr tablet Take 3 tablets (1,500 mg total) by mouth daily. 270 tablet 3  . neomycin-bacitracin-polymyxin (NEOSPORIN) ointment Apply 1 application topically every 12 (twelve) hours. 15 g 0  . omeprazole (PRILOSEC) 20 MG capsule TAKE 1 CAPSULE (20 MG TOTAL) BY MOUTH EVERY MORNING. 90 capsule 0  . Semaglutide, 1 MG/DOSE, (OZEMPIC, 1 MG/DOSE,) 2 MG/1.5ML SOPN Inject 1 mg into the skin once a week. 6 pen 3  . sulfamethoxazole-trimethoprim (BACTRIM DS) 800-160 MG tablet Take 1 tablet by mouth 2 (two) times daily. 14 tablet 0  . UNIFINE PENTIPS 31G X 8 MM MISC USE WITH INSULIN TWICE DAILY AS DIRECTED    . [DISCONTINUED] albuterol (PROVENTIL HFA;VENTOLIN HFA) 108 (90 Base) MCG/ACT inhaler Inhale 1-2 puffs into the lungs every 4 (four) hours  as needed for wheezing or shortness of breath. 1 Inhaler 0   Current Facility-Administered Medications on File Prior to Visit  Medication Dose Route Frequency Provider Last Rate Last Admin  . 0.9 %  sodium chloride infusion  500 mL Intravenous Once Pyrtle, Lajuan Lines, MD        Past Surgical History:  Procedure Laterality Date  . arm surgery    . COLONOSCOPY    . ELBOW SURGERY    . FOOT SURGERY Bilateral   . POLYPECTOMY      No Known Allergies  Social History   Socioeconomic History  . Marital status: Single    Spouse name: Not on file  . Number of children: Not on file  . Years of education: Not on file  . Highest education level: Not on file  Occupational History  . Occupation: Engineer, maintenance (IT) at [employer]  . Smoking status: Former Smoker    Packs/day: 0.25    Years: 5.00    Pack years: 1.25    Types: Cigarettes    Quit date: 06/04/1978    Years since quitting: 41.3  . Smokeless tobacco: Never Used  Vaping Use  . Vaping Use: Never used  Substance and Sexual Activity  . Alcohol use: No  . Drug use: No  . Sexual activity: Not on file  Other Topics Concern  . Not on file  Social History Narrative   ** Merged History Encounter **       Social Determinants of Health   Financial Resource Strain:   . Difficulty of Paying Living Expenses:   Food Insecurity:   . Worried About Charity fundraiser in the Last Year:   . Arboriculturist in the Last Year:   Transportation Needs:   . Film/video editor (Medical):   Marland Kitchen Lack of Transportation (Non-Medical):   Physical Activity:   . Days of Exercise per Week:   . Minutes of Exercise per Session:   Stress:   . Feeling of Stress :   Social Connections:   . Frequency of Communication with Friends and Family:   . Frequency of Social Gatherings with Friends and Family:   . Attends Religious Services:   . Active Member of Clubs or Organizations:   . Attends Archivist Meetings:   Marland Kitchen Marital Status:   Intimate Partner Violence:   . Fear of Current or Ex-Partner:   . Emotionally Abused:   Marland Kitchen Physically Abused:   . Sexually Abused:     Family History  Problem Relation Age of Onset  . Diabetes Father   . Colon cancer Neg Hx   . Colon polyps Neg Hx   . Rectal cancer Neg Hx   . Stomach cancer Neg Hx     BP (!) 163/84   Ht '6\' 2"'  (1.88 m)   Wt (!) 270 lb (122.5 kg)   BMI 34.67 kg/m   Review of Systems: See HPI above.     Objective:  Physical Exam:  Gen: NAD, comfortable in exam room  Left lower leg: Interval healing is noted to all 3 abrasions of his lower leg.  Granulation tissue visualized underlying the crust of the mid tibial wound and this is decreased in size as well.  No surrounding erythema,  warmth, purulence. Full range of motion with 5 out of 5 strength of ankle and knee. Minimal tenderness to palpation around mid tibial wound.  This is improved. Neurovascular intact distally.  Assessment & Plan:  1. Left lower leg injury -abrasions continue to heal well.  No evidence of cellulitis, abscess.  He is still having some pain but this is also improved.  We will give him an additional few days to recover before returning to work on Monday.  Follow-up with [place] as needed.

## 2019-10-10 NOTE — Patient Instructions (Signed)
Return to work on Monday - give this a few more days to heal up so you can walk with less pain. Call me if you have any problems. I expect this to continue to heal over the next 2-3 weeks. Follow up as needed.

## 2019-10-22 MED FILL — OZEMPIC (1 MG/DOSE) 4 MG/3M: 4 | 84 days supply | Qty: 9 | Fill #0

## 2019-10-26 ENCOUNTER — Telehealth: Payer: Self-pay | Admitting: Family Medicine

## 2019-10-26 NOTE — Telephone Encounter (Signed)
Courtney/ [facility]  [facility] adjuster called regarding patient's claim she gave [facility] claim information & request OV notes be faxed to her attention @ [phone].    [facility] clm# ---  [id number]    --fyi.  --glh

## 2019-10-31 DIAGNOSIS — H35033 Hypertensive retinopathy, bilateral: Secondary | ICD-10-CM | POA: Diagnosis not present

## 2019-10-31 DIAGNOSIS — H3563 Retinal hemorrhage, bilateral: Secondary | ICD-10-CM | POA: Diagnosis not present

## 2019-10-31 DIAGNOSIS — H04123 Dry eye syndrome of bilateral lacrimal glands: Secondary | ICD-10-CM | POA: Diagnosis not present

## 2019-10-31 DIAGNOSIS — H2513 Age-related nuclear cataract, bilateral: Secondary | ICD-10-CM | POA: Diagnosis not present

## 2019-10-31 DIAGNOSIS — H35013 Changes in retinal vascular appearance, bilateral: Secondary | ICD-10-CM | POA: Diagnosis not present

## 2019-10-31 DIAGNOSIS — E113393 Type 2 diabetes mellitus with moderate nonproliferative diabetic retinopathy without macular edema, bilateral: Secondary | ICD-10-CM | POA: Diagnosis not present

## 2019-11-12 ENCOUNTER — Other Ambulatory Visit: Payer: Self-pay | Admitting: Endocrinology

## 2019-11-12 MED FILL — METFORMIN HCL ER 500 MG TB2: 500 | 90 days supply | Qty: 360 | Fill #0

## 2019-11-15 ENCOUNTER — Other Ambulatory Visit: Payer: Self-pay | Admitting: Emergency Medicine

## 2019-11-15 DIAGNOSIS — G6289 Other specified polyneuropathies: Secondary | ICD-10-CM

## 2019-11-15 MED FILL — GABAPENTIN 100 MG CAPSULE: 100 | 90 days supply | Qty: 180 | Fill #0

## 2019-11-19 ENCOUNTER — Encounter (HOSPITAL_COMMUNITY): Payer: Self-pay

## 2019-11-19 ENCOUNTER — Emergency Department (HOSPITAL_COMMUNITY)
Admission: EM | Admit: 2019-11-19 | Discharge: 2019-11-20 | Disposition: A | Discharge status: Home / Self Care | Payer: 59 | Attending: Emergency Medicine | Admitting: Emergency Medicine

## 2019-11-19 ENCOUNTER — Ambulatory Visit (HOSPITAL_COMMUNITY)
Admission: EM | Admit: 2019-11-19 | Discharge: 2019-11-19 | Disposition: A | Discharge status: Other Acute Inpatient Hospital | Payer: Self-pay

## 2019-11-19 ENCOUNTER — Emergency Department (HOSPITAL_COMMUNITY): Payer: 59

## 2019-11-19 ENCOUNTER — Other Ambulatory Visit: Payer: Self-pay

## 2019-11-19 DIAGNOSIS — I10 Essential (primary) hypertension: Secondary | ICD-10-CM | POA: Diagnosis not present

## 2019-11-19 DIAGNOSIS — Z87891 Personal history of nicotine dependence: Secondary | ICD-10-CM | POA: Insufficient documentation

## 2019-11-19 DIAGNOSIS — J90 Pleural effusion, not elsewhere classified: Secondary | ICD-10-CM | POA: Diagnosis not present

## 2019-11-19 DIAGNOSIS — G319 Degenerative disease of nervous system, unspecified: Secondary | ICD-10-CM | POA: Diagnosis not present

## 2019-11-19 DIAGNOSIS — R42 Dizziness and giddiness: Secondary | ICD-10-CM | POA: Insufficient documentation

## 2019-11-19 DIAGNOSIS — Z794 Long term (current) use of insulin: Secondary | ICD-10-CM | POA: Insufficient documentation

## 2019-11-19 DIAGNOSIS — E119 Type 2 diabetes mellitus without complications: Secondary | ICD-10-CM | POA: Diagnosis not present

## 2019-11-19 DIAGNOSIS — G529 Cranial nerve disorder, unspecified: Secondary | ICD-10-CM | POA: Diagnosis not present

## 2019-11-19 DIAGNOSIS — R2689 Other abnormalities of gait and mobility: Secondary | ICD-10-CM | POA: Diagnosis not present

## 2019-11-19 DIAGNOSIS — I6782 Cerebral ischemia: Secondary | ICD-10-CM | POA: Diagnosis not present

## 2019-11-19 DIAGNOSIS — H532 Diplopia: Secondary | ICD-10-CM | POA: Diagnosis not present

## 2019-11-19 DIAGNOSIS — Z79899 Other long term (current) drug therapy: Secondary | ICD-10-CM | POA: Diagnosis not present

## 2019-11-19 DIAGNOSIS — R519 Headache, unspecified: Secondary | ICD-10-CM | POA: Insufficient documentation

## 2019-11-19 LAB — I-STAT CHEM 8, ED
BUN: 18 mg/dL (ref 8–23)
Calcium, Ion: 1.18 mmol/L (ref 1.15–1.40)
Chloride: 103 mmol/L (ref 98–111)
Creatinine, Ser: 0.8 mg/dL (ref 0.61–1.24)
Glucose, Bld: 130 mg/dL — ABNORMAL HIGH (ref 70–99)
HCT: 46 % (ref 39.0–52.0)
Hemoglobin: 15.6 g/dL (ref 13.0–17.0)
Potassium: 4.5 mmol/L (ref 3.5–5.1)
Sodium: 141 mmol/L (ref 135–145)
TCO2: 26 mmol/L (ref 22–32)

## 2019-11-19 LAB — DIFFERENTIAL
Abs Immature Granulocytes: 0.01 10*3/uL (ref 0.00–0.07)
Basophils Absolute: 0.1 10*3/uL (ref 0.0–0.1)
Basophils Relative: 1 %
Eosinophils Absolute: 0.3 10*3/uL (ref 0.0–0.5)
Eosinophils Relative: 4 %
Immature Granulocytes: 0 %
Lymphocytes Relative: 28 %
Lymphs Abs: 2.4 10*3/uL (ref 0.7–4.0)
Monocytes Absolute: 0.7 10*3/uL (ref 0.1–1.0)
Monocytes Relative: 8 %
Neutro Abs: 5.2 10*3/uL (ref 1.7–7.7)
Neutrophils Relative %: 59 %

## 2019-11-19 LAB — CBG MONITORING, ED
Glucose-Capillary: 59 mg/dL — ABNORMAL LOW (ref 70–99)
Glucose-Capillary: 89 mg/dL (ref 70–99)

## 2019-11-19 LAB — CBC
HCT: 46.9 % (ref 39.0–52.0)
Hemoglobin: 15.9 g/dL (ref 13.0–17.0)
MCH: 30.4 pg (ref 26.0–34.0)
MCHC: 33.9 g/dL (ref 30.0–36.0)
MCV: 89.7 fL (ref 80.0–100.0)
Platelets: 162 10*3/uL (ref 150–400)
RBC: 5.23 MIL/uL (ref 4.22–5.81)
RDW: 12.3 % (ref 11.5–15.5)
WBC: 8.7 10*3/uL (ref 4.0–10.5)
nRBC: 0 % (ref 0.0–0.2)

## 2019-11-19 LAB — COMPREHENSIVE METABOLIC PANEL
ALT: 17 U/L (ref 0–44)
AST: 17 U/L (ref 15–41)
Albumin: 3.7 g/dL (ref 3.5–5.0)
Alkaline Phosphatase: 70 U/L (ref 38–126)
Anion gap: 10 (ref 5–15)
BUN: 15 mg/dL (ref 8–23)
CO2: 25 mmol/L (ref 22–32)
Calcium: 9 mg/dL (ref 8.9–10.3)
Chloride: 104 mmol/L (ref 98–111)
Creatinine, Ser: 0.9 mg/dL (ref 0.61–1.24)
GFR calc Af Amer: >60 mL/min (ref >60)
GFR calc non Af Amer: >60 mL/min (ref >60)
Glucose, Bld: 137 mg/dL — ABNORMAL HIGH (ref 70–99)
Potassium: 4.4 mmol/L (ref 3.5–5.1)
Sodium: 139 mmol/L (ref 135–145)
Total Bilirubin: 0.8 mg/dL (ref 0.3–1.2)
Total Protein: 6.6 g/dL (ref 6.5–8.1)

## 2019-11-19 LAB — PROTIME-INR
INR: 1.1 (ref 0.8–1.2)
Prothrombin Time: 13.5 seconds (ref 11.4–15.2)

## 2019-11-19 LAB — APTT: aPTT: 30 seconds (ref 24–36)

## 2019-11-19 MED ORDER — SODIUM CHLORIDE 0.9% FLUSH
3.0000 mL | Freq: Once | INTRAVENOUS | Status: AC
  Administered 2019-11-20: 3 mL via INTRAVENOUS

## 2019-11-19 NOTE — ED Triage Notes (Signed)
Pt states LSN Friday night, pt having double vision in both eyes, dizziness, HA and unsteady gait.

## 2019-11-19 NOTE — ED Triage Notes (Signed)
Pt c/o Ha, dizziness, double vision and loss of balancex3 days. Pt states the sx are getting worse. Pt had steady gait to triage. NIH 1 for sensation. PT states has decreased sensation in left leg.

## 2019-11-19 NOTE — ED Notes (Signed)
Patient is being discharged from the Urgent Care and sent to the Emergency Department via POV . Per Amy, MD, patient is in need of higher level of care due to needing higher level of care and further testing. Patient is aware and verbalizes understanding of plan of care.  Vitals:   11/19/19 1900  BP: (!) 159/83  Pulse: 77  Resp: 18  Temp: 98.4 F (36.9 C)  SpO2: 98%

## 2019-11-19 NOTE — ED Provider Notes (Signed)
[facility] EMERGENCY DEPARTMENT Provider Note   CSN: [id number] Arrival date & time: 11/19/19  1914     History Chief Complaint  Patient presents with  . Diplopia  . Dizziness    James Garcia is a 64 y.o. male.  The history is provided by the patient and medical records. No language interpreter was used.  Eye Problem Location:  Both eyes Quality: double vision. Severity:  Severe Onset quality:  Sudden Duration:  4 days Timing:  Constant Progression:  Unchanged Chronicity:  Recurrent Context: not direct trauma   Relieved by:  Nothing Worsened by:  Nothing Associated symptoms: double vision and headaches   Associated symptoms: no nausea, no numbness, no photophobia, no swelling, no tearing, no tingling, no vomiting and no weakness        Past Medical History:  Diagnosis Date  . Constipation    uses OTC laxatives - hard stools every morning- small amount and feels like doesnt empty   . Diabetes mellitus without complication ([place])   . DM type 2 (diabetes mellitus, type 2) ([place])   . Hyperlipidemia   . Hypertension   . Leg cramps   . 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 Tuberculosis    pt states was treated for TB    Patient Active Problem List   Diagnosis Date Noted  . Left leg pain 09/24/2019  . Diabetes ([place]) 05/01/2015  . Thrombocytopenia ([place]) 06/04/2014    Past Surgical History:  Procedure Laterality Date  . arm surgery    . COLONOSCOPY    . ELBOW SURGERY    . FOOT SURGERY Bilateral   . POLYPECTOMY         Family History  Problem Relation Age of Onset  . Diabetes Father   . Colon cancer Neg Hx   . Colon polyps Neg Hx   . Rectal cancer Neg Hx   . Stomach cancer Neg Hx     Social History   Tobacco Use  . Smoking status: Former Smoker    Packs/day: 0.25    Years: 5.00    Pack years: 1.25    Types: Cigarettes    Quit date: 06/04/1978    Years since quitting: 41.4  . Smokeless tobacco: Never Used  Vaping Use  .  Vaping Use: Never used  Substance Use Topics  . Alcohol use: No  . Drug use: No    Home Medications Prior to Admission medications   Medication Sig Start Date End Date Taking? Authorizing Provider  amLODipine (NORVASC) 5 MG tablet TAKE 1 TABLET BY MOUTH ONCE A DAY 09/18/19   Horald Pollen, MD  atorvastatin (LIPITOR) 20 MG tablet TAKE 1 TABLET BY MOUTH ONCE A DAY 09/18/19   Horald Pollen, MD  Blood Glucose Monitoring Suppl (FREESTYLE LITE) DEVI 1 kit by Does not apply route daily. Use to test blood sugars daily 04/14/18   Renato Shin, MD  Blood Pressure Monitor KIT Use as instructed for HTN 07/12/17   Wendie Agreste, MD  cetirizine (ZYRTEC) 10 MG tablet Take 10 mg by mouth daily. 08/30/19   [provider]  fluticasone (FLONASE) 50 MCG/ACT nasal spray Place 1-2 sprays into both nostrils daily for 7 days. 08/29/19 09/05/19  Wieters, Hallie C, PA-C  gabapentin (NEURONTIN) 100 MG capsule TAKE 1 CAPSULE BY MOUTH TWICE A DAY 11/15/19   Horald Pollen, MD  glucose blood test strip  05/08/19   [provider]  Insulin Glargine [facility])  100 UNIT/ML SOPN Inject 0.6 mLs (60 Units total) into the skin every morning. And pen needles 2/day 05/07/19   Renato Shin, MD  Lancets (FREESTYLE) lancets USE TO CHECK BLOOD SUGAR 2 TIMES PER DAY. 11/02/17   Renato Shin, MD  losartan (COZAAR) 100 MG tablet Take 1 tablet (100 mg total) by mouth daily. 11/30/17   Wendie Agreste, MD  metFORMIN (GLUCOPHAGE-XR) 500 MG 24 hr tablet TAKE 4 TABLETS BY MOUTH DAILY WITH BREAKFAST. 11/12/19   Renato Shin, MD  neomycin-bacitracin-polymyxin (NEOSPORIN) ointment Apply 1 application topically every 12 (twelve) hours. 09/20/19   Rosemarie Ax, MD  omeprazole (PRILOSEC) 20 MG capsule TAKE 1 CAPSULE (20 MG TOTAL) BY MOUTH EVERY MORNING. 03/27/19   Sagardia, Ines Bloomer, MD  Semaglutide, 1 MG/DOSE, (OZEMPIC, 1 MG/DOSE,) 2 MG/1.5ML SOPN Inject 1 mg into the skin once a week. 04/04/19    Renato Shin, MD  sulfamethoxazole-trimethoprim (BACTRIM DS) 800-160 MG tablet Take 1 tablet by mouth 2 (two) times daily. 09/24/19   Rosemarie Ax, MD  UNIFINE PENTIPS 31G X 8 MM MISC USE WITH INSULIN TWICE DAILY AS DIRECTED 09/13/19   [provider]  albuterol (PROVENTIL HFA;VENTOLIN HFA) 108 (90 Base) MCG/ACT inhaler Inhale 1-2 puffs into the lungs every 4 (four) hours as needed for wheezing or shortness of breath. 12/24/17 08/29/19  Wendie Agreste, MD    Allergies    Patient has no known allergies.  Review of Systems   Review of Systems  Constitutional: Negative for chills, diaphoresis, fatigue and fever.  Eyes: Positive for double vision and visual disturbance. Negative for photophobia.  Respiratory: Negative for chest tightness, shortness of breath and wheezing.   Cardiovascular: Negative for chest pain and palpitations.  Gastrointestinal: Negative for abdominal pain, constipation, diarrhea, nausea and vomiting.  Musculoskeletal: Positive for gait problem. Negative for back pain, neck pain and neck stiffness.  Skin: Negative for rash and wound.  Neurological: Positive for dizziness and headaches. Negative for tingling, weakness, light-headedness and numbness.  Psychiatric/Behavioral: Negative for agitation.    Physical Exam Updated Vital Signs BP 134/79   Pulse 66   Temp 98.6 F (37 C) (Oral)   Resp 18   Ht 6' 2" (1.88 m)   Wt 122.5 kg   SpO2 98%   BMI 34.67 kg/m   Physical Exam Vitals and nursing note reviewed.  Constitutional:      General: He is not in acute distress.    Appearance: He is well-developed. He is not ill-appearing, toxic-appearing or diaphoretic.  HENT:     Head: Normocephalic and atraumatic.     Nose: Nose normal. No congestion or rhinorrhea.     Mouth/Throat:     Mouth: Mucous membranes are moist.     Pharynx: No oropharyngeal exudate or posterior oropharyngeal erythema.  Eyes:     Extraocular Movements:     Right eye: Abnormal  extraocular motion present.     Left eye: Normal extraocular motion.     Conjunctiva/sclera: Conjunctivae normal.     Pupils: Pupils are equal, round, and reactive to light.      Comments: R eye will not go out laterally  Also left leg numbness compared to right unclear timeline and mild left finger-nose-finger irregularity with some dysmetria.    Neck:     Vascular: No carotid bruit.  Cardiovascular:     Rate and Rhythm: Normal rate and regular rhythm.     Pulses: Normal pulses.     Heart sounds: No murmur heard.  Pulmonary:     Effort: Pulmonary effort is normal. No respiratory distress.     Breath sounds: Normal breath sounds. No stridor. No wheezing, rhonchi or rales.  Chest:     Chest wall: No tenderness.  Abdominal:     General: Abdomen is flat.     Palpations: Abdomen is soft.     Tenderness: There is no abdominal tenderness.  Musculoskeletal:        General: No tenderness.     Cervical back: Neck supple. No tenderness.     Right lower leg: No edema.     Left lower leg: No edema.  Skin:    General: Skin is warm and dry.     Findings: No erythema.  Neurological:     Mental Status: He is alert.     Sensory: Sensory deficit present.     Motor: No weakness.  Psychiatric:        Mood and Affect: Mood normal.     ED Results / Procedures / Treatments   Labs (all labs ordered are listed, but only abnormal results are displayed) Labs Reviewed  COMPREHENSIVE METABOLIC PANEL - Abnormal; Notable for the following components:      Result Value   Glucose, Bld 137 (*)    All other components within normal limits  I-STAT CHEM 8, ED - Abnormal; Notable for the following components:   Glucose, Bld 130 (*)    All other components within normal limits  CBG MONITORING, ED - Abnormal; Notable for the following components:   Glucose-Capillary 59 (*)    All other components within normal limits  PROTIME-INR  APTT  CBC  DIFFERENTIAL  CBG MONITORING, ED    EKG EKG  Interpretation  Date/Time:  Monday November 19 2019 19:28:00 EDT Ventricular Rate:  75 PR Interval:  144 QRS Duration: 84 QT Interval:  384 QTC Calculation: 428 R Axis:   13 Text Interpretation: Normal sinus rhythm Cannot rule out Anterior infarct , age undetermined Abnormal ECG When compared to prior similar apperance. No STEMI Confirmed by Antony Blackbird [id number]) on 11/19/2019 10:05:19 PM   Radiology CT HEAD WO CONTRAST  Result Date: 11/19/2019 CLINICAL DATA:  Dizziness and diplopia for the past 3 days. EXAM: CT HEAD WITHOUT CONTRAST TECHNIQUE: Contiguous axial images were obtained from the base of the skull through the vertex without intravenous contrast. COMPARISON:  None. FINDINGS: Brain: Mildly enlarged ventricles and cortical sulci. Mild patchy white matter low density in both cerebral hemispheres. No intracranial hemorrhage, mass lesion or CT evidence of acute infarction. Vascular: No hyperdense vessel or unexpected calcification. Skull: Normal. Negative for fracture or focal lesion. Sinuses/Orbits: Small bilateral maxillary sinus retention cysts. Unremarkable orbits. Other: Right concha bullosa with deviation of the adjacent nasal septum to the left. IMPRESSION: 1. No acute abnormality. 2. Mild diffuse cerebral and cerebellar atrophy. 3. Mild chronic small vessel white matter ischemic changes in both cerebral hemispheres. Electronically Signed   By: Claudie Revering M.D.   On: 11/19/2019 19:56    Procedures Procedures (including critical care time)  Medications Ordered in ED Medications  sodium chloride flush (NS) 0.9 % injection 3 mL (has no administration in time range)    ED Course  I have reviewed the triage vital signs and the nursing notes.  Pertinent labs & imaging results that were available during my care of the patient were reviewed by me and considered in my medical decision making (see chart for details).    MDM Rules/Calculators/A&P  ROSSER COLLINGTON is a 64 y.o. male with a past medical with a past medical history significant for hypertension, hyperlipidemia, and diabetes who presents with diplopia, headache, dizziness, and unsteady gait.  He reports that in the past he has had some diplopia before but has never had any persistent symptoms nor has he had the dizziness, unsteady gait, and headaches.  He reports no recent trauma.  He denies any neck pain.  He denies any nausea, vomiting, urinary symptoms or GI symptoms.  No fevers or chills or nuchal rigidity.  He is concerned he may have had a stroke.  On exam, lungs are clear and chest is nontender.  Abdomen is nontender.  Patient has some numbness in his left leg compared to right which he reports he is unsure when this began.  He has symmetric grip strength.  He had mild dysmetria in his left finger-nose-finger testing compared to right.  He had symmetric smile.  Extraocular movements were abnormal.  When patient tried to look to the left, both eyes tracked to the left.  When he looked to the right, the right I would not look to the right.  Patient had persistent nystagmus in the left eye when this occurred.  His pupils were symmetric and reactive however.  Clear speech.  Patient had imaging in triage which included a CT of the head which showed chronic microvascular changes as well as cerebellar and cerebral atrophy.  Due to this abnormality on extraocular movement exam, I am somewhat concerned about abducens nerve palsy.  Unclear if this is from a stroke or just from his diabetes.  Neurology will come to the patient and leave recommendations on further imaging and further management.  If neurology feels appropriate, dissipate discharge home however they may want further imaging such as MRI.  Care transferred to conjoin team while awaiting for neurology recommendations and further work-up.   Final Clinical Impression(s) / ED Diagnoses Final diagnoses:  Diplopia  Dizziness  Acute  nonintractable headache, unspecified headache type     Clinical Impression: 1. Diplopia   2. Dizziness   3. Acute nonintractable headache, unspecified headache type     Disposition: Care transferred to Dr. Betsey Holiday while awaiting for Neurology to see pt.   This note was prepared with assistance of Systems analyst. Occasional wrong-word or sound-a-like substitutions may have occurred due to the inherent limitations of voice recognition software.      Dezman Granda, Gwenyth Allegra, MD 11/20/19 [phone]

## 2019-11-19 NOTE — ED Notes (Signed)
(  CBG 59) Pt given [place] sandwhich, apple sauce,and orange juice.

## 2019-11-19 NOTE — Consult Note (Addendum)
Neurology Consultation Reason for Consult: Double vision Referring Physician: Harrell Gave Tegeler  CC: Double vision and "tumbling" feeling  History is obtained from: Patient and chart review  HPI: James Garcia is a 64 y.o. male with a past medical history significant for well-controlled diabetes (most recent A1c 7.0, though he has significant neuropathy and moderate nonproliferative retinopathy), hypertension, hyperlipidemia, presenting with double vision.  He notes that he had an episode of this previously perhaps in May or last August (no notes available in our EMR) that self resolved.  That episode was associated only with double vision.  This episode has seemed more severe and he feels that it is perhaps worsened over the last several days.  He reports that initially he had a slight headache and this is worsened to 6 out of 10.  Headache feels like a tight band around his whole head.  He has no neck pain, neck stiffness, light or sound sensitivity, fever, nausea, vomiting.  The headache is not positional and is not worse at any particular time of day.  It has been constant and has not been coming and going.  He has received his first COVID-19 vaccination, with a second dose planned for September 10th.  LKW: 9/2 tPA given?: No, due to out of the window ROS: A 14 point ROS was performed and is negative except as noted in the HPI.  Specifically, he denies any changes in his hearing, speech, swallow, cough, cold, shortness of breath, palpitations, rashes, fatigue, swelling, focal weakness, new focal numbness, dysuria, hematuria, blood in his stool, diarrhea, constipation  Past Medical History:  Diagnosis Date  . Constipation    uses OTC laxatives - hard stools every morning- small amount and feels like doesnt empty   . Diabetes mellitus without complication ([place])   . DM type 2 (diabetes mellitus, type 2) ([place])   . Hyperlipidemia   . Hypertension   . Leg cramps   . Tuberculin skin test  (TST) positive   . Tuberculosis    pt states was treated for TB   Past Surgical History:  Procedure Laterality Date  . arm surgery    . COLONOSCOPY    . ELBOW SURGERY    . FOOT SURGERY Bilateral   . POLYPECTOMY     Current Outpatient Medications  Medication Instructions  . amLODipine (NORVASC) 5 MG tablet TAKE 1 TABLET BY MOUTH ONCE A DAY  . atorvastatin (LIPITOR) 20 MG tablet TAKE 1 TABLET BY MOUTH ONCE A DAY  . Basaglar KwikPen 60 Units, Subcutaneous, BH-each morning, And pen needles 2/day  . Blood Glucose Monitoring Suppl (FREESTYLE LITE) DEVI 1 kit, Does not apply, Daily, Use to test blood sugars daily  . Blood Pressure Monitor KIT Use as instructed for HTN  . cetirizine (ZYRTEC) 10 mg, Oral, Daily  . fluticasone (FLONASE) 50 MCG/ACT nasal spray 1-2 sprays, Each Nare, Daily  . gabapentin (NEURONTIN) 100 MG capsule TAKE 1 CAPSULE BY MOUTH TWICE A DAY  . glucose blood test strip No dose, route, or frequency recorded.  . Lancets (FREESTYLE) lancets USE TO CHECK BLOOD SUGAR 2 TIMES PER DAY.  Marland Kitchen losartan (COZAAR) 100 mg, Oral, Daily  . metFORMIN (GLUCOPHAGE-XR) 500 MG 24 hr tablet TAKE 4 TABLETS BY MOUTH DAILY WITH BREAKFAST.  Marland Kitchen neomycin-bacitracin-polymyxin (NEOSPORIN) ointment 1 application, Topical, Every 12 hours  . omeprazole (PRILOSEC) 20 mg, Oral,  Every morning - 10a  . Ozempic (1 MG/DOSE) 1 mg, Subcutaneous, Weekly  . sulfamethoxazole-trimethoprim (BACTRIM DS) 800-160 MG tablet 1  tablet, Oral, 2 times daily  . UNIFINE PENTIPS 31G X 8 MM MISC USE WITH INSULIN TWICE DAILY AS DIRECTED    Family History  Problem Relation Age of Onset  . Diabetes Father   . Colon cancer Neg Hx   . Colon polyps Neg Hx   . Rectal cancer Neg Hx   . Stomach cancer Neg Hx    Social History:  reports that he quit smoking about 41 years ago. His smoking use included cigarettes. He has a 1.25 pack-year smoking history. He has never used smokeless tobacco. He reports that he does not drink alcohol  and does not use drugs.  Exam: Current vital signs: BP 137/85   Pulse 67   Temp 98.6 F (37 C) (Oral)   Resp 10   Ht _0  (1.88 m)   Wt 122.5 kg   SpO2 99%   BMI 34.67 kg/m  Vital signs in last 24 hours: Temp:  [98.4 F (36.9 C)-98.6 F (37 C)] 98.6 F (37 C) (09/06 1924) Pulse Rate:  [66-77] 67 (09/06 2215) Resp:  [10-18] 10 (09/06 2215) BP: (130-159)/(79-90) 137/85 (09/06 2215) SpO2:  [97 %-99 %] 99 % (09/06 2215) Weight:  [122.5 kg] 122.5 kg (09/06 1921)   Physical Exam  Constitutional: Appears well-developed and well-nourished.  Psych: Affect appropriate to situation, cooperative, kind and polite Eyes: No scleral injection HENT: No OP obstruction, fairly poor dentition, no occipital tenderness or temporal artery tenderness MSK: no joint deformities.  Cardiovascular: Normal rate and regular rhythm.  Respiratory: Effort normal, non-labored breathing GI: Soft.  No distension. There is no tenderness.  Skin: Warm dry and intact  Neuro: Mental Status: Patient is awake, alert, oriented to person, place, month, year, and situation. Patient is able to give a clear and coherent history. No signs of aphasia or neglect Cranial Nerves: II: Visual Fields are full. Pupils are equal, round, and reactive to light.  Funduscopic exam without evidence of papilledema III,IV, VI: EOMI without ptosis, intact other than right eye abduction V: Facial sensation is symmetric to temperature VII: Facial movement is symmetric.  VIII: hearing is intact to voice X: Uvula elevates symmetrically XI: Shoulder shrug is symmetric. XII: tongue is midline without atrophy or fasciculations.  Motor: Tone is normal. Bulk is normal. 5/5 strength was present in all four extremities.  Sensory: Sensation is symmetric to light touch in the arms and legs, reduced temperature sensation in a glove and stocking distribution Deep Tendon Reflexes: 2+ and symmetric in the biceps, brachioradialis,  patella Plantars: Toes are downgoing bilaterally.  Cerebellar: FNF and HKS are intact bilaterally Gait:  Romberg is negative, patient is able to ambulate steadily when looking straight ahead and if he turns towards the left, but is intermittently unbalanced if he looks towards the right  I have reviewed labs in epic and the results pertinent to this consultation are: A1c steadily improving from 10.5% in 2017 to 7.0% in June 2021, 7.3% this encounter Cr 0.8, GFR > 60 Normal CBC Likely spurious low capillary blood glucose given normal blood glucose on CMP  I have reviewed the images obtained:  11/19/2019 Dry head CT notable for chronic microvascular changes and some scattered atherosclerotic change  Impression: This is a 64 year old gentleman with a past medical history significant for diabetes (well controlled), hypertension, hyperlipidemia, presenting with 4 days of double vision as well as a headache.  There are no red flag features on headache history or examination, and I favor headache to represent a tension  headache.  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  The most likely etiology in a patient of this profile is microvascular disease.  However given the possible progression over the course of 4 days, he needs neuroimaging to rule out aneurysm or inflammatory lesion. I will also obtain chest x-ray to evaluate for sarcoidosis, as well as some basic serologies for inflammatory etiologies.    Recommendations: -ESR, RPR, TSH (ordered)  -MRI brain with and without contrast -MRA head -Chest x-ray for eval for sarcoidosis  -Outpatient referral to ophthalmology for full evaluation of detailed visual fields, dilated eye exam and prism glasses  Lesleigh Noe MD-PhD [facility] [phone]  Addendum:   Gait evaluation performed at approximately 6 AM, as documented above.  Patient reports he feels comfortable with discharge home with regards to his  gait, and has a support he needs to get to outpatient physical therapy/Occupational Therapy to support safe ambulation.  He asked about driving and was counseled to avoid driving given the severity of his diplopia and symptoms anytime he looks to the right.  He was asked to follow-up with ophthalmology for clearance to drive.  ESR and TSH normal, A1c 7.3%, RPR pending  Chest x-ray personally reviewed and without concern for sarcoidosis  MRI brain and MRA personally reviewed and without acute stroke, suspicious enhancement or significant aneurysm No further inpatient neurological workup needed  [facility] [phone]   Addended for charge capture

## 2019-11-20 ENCOUNTER — Emergency Department (HOSPITAL_COMMUNITY): Payer: 59

## 2019-11-20 DIAGNOSIS — J90 Pleural effusion, not elsewhere classified: Secondary | ICD-10-CM | POA: Diagnosis not present

## 2019-11-20 DIAGNOSIS — E119 Type 2 diabetes mellitus without complications: Secondary | ICD-10-CM | POA: Diagnosis not present

## 2019-11-20 DIAGNOSIS — R2689 Other abnormalities of gait and mobility: Secondary | ICD-10-CM | POA: Diagnosis not present

## 2019-11-20 DIAGNOSIS — R519 Headache, unspecified: Secondary | ICD-10-CM | POA: Diagnosis not present

## 2019-11-20 DIAGNOSIS — Z79899 Other long term (current) drug therapy: Secondary | ICD-10-CM | POA: Diagnosis not present

## 2019-11-20 DIAGNOSIS — Z794 Long term (current) use of insulin: Secondary | ICD-10-CM | POA: Diagnosis not present

## 2019-11-20 DIAGNOSIS — R42 Dizziness and giddiness: Secondary | ICD-10-CM | POA: Diagnosis not present

## 2019-11-20 DIAGNOSIS — Z87891 Personal history of nicotine dependence: Secondary | ICD-10-CM | POA: Diagnosis not present

## 2019-11-20 DIAGNOSIS — I10 Essential (primary) hypertension: Secondary | ICD-10-CM | POA: Diagnosis not present

## 2019-11-20 DIAGNOSIS — H532 Diplopia: Secondary | ICD-10-CM | POA: Diagnosis not present

## 2019-11-20 LAB — HEMOGLOBIN A1C
Hgb A1c MFr Bld: 7.3 % — ABNORMAL HIGH (ref 4.8–5.6)
Mean Plasma Glucose: 162.81 mg/dL

## 2019-11-20 LAB — SEDIMENTATION RATE: Sed Rate: 2 mm/hr (ref 0–16)

## 2019-11-20 LAB — TSH: TSH: 2.226 u[IU]/mL (ref 0.350–4.500)

## 2019-11-20 LAB — RPR: RPR Ser Ql: NONREACTIVE

## 2019-11-20 MED ORDER — LORAZEPAM 2 MG/ML IJ SOLN: 1.0000 mg | Freq: Once | INTRAMUSCULAR | Status: AC

## 2019-11-20 MED ORDER — GADOBUTROL 1 MMOL/ML IV SOLN
10.0000 mL | Freq: Once | INTRAVENOUS | Status: AC | PRN
  Administered 2019-11-20: 10 mL via INTRAVENOUS

## 2019-11-20 MED ORDER — ACETAMINOPHEN 325 MG PO TABS: 650.0000 mg | ORAL_TABLET | Freq: Once | ORAL | Status: DC

## 2019-11-20 MED ORDER — LORAZEPAM 2 MG/ML IJ SOLN
INTRAMUSCULAR | Status: AC
  Administered 2019-11-20: 1 mg via INTRAVENOUS
  Filled 2019-11-20: qty 1

## 2019-11-20 MED ORDER — IBUPROFEN 400 MG PO TABS: 600.0000 mg | ORAL_TABLET | Freq: Once | ORAL | Status: DC

## 2019-11-20 NOTE — ED Provider Notes (Signed)
Patient signed out to me by Dr. Sherry Ruffing.  Patient with cranial nerve VI palsy.  Appreciate neurology consultation.  Additional imaging and labs have been performed and are reassuring.  Patient will therefore be referred for outpatient neurology and ophthalmology follow-up.   Orpah Greek, MD 11/20/19 0530

## 2019-11-21 ENCOUNTER — Telehealth: Payer: Self-pay | Admitting: Emergency Medicine

## 2019-11-21 ENCOUNTER — Encounter: Payer: Self-pay | Admitting: Neurology

## 2019-11-21 NOTE — Telephone Encounter (Signed)
[employer] FML[employer] paperwork came via fax for this patient / placed paperwork to be completed by provider in cma/provider mail box at nurses station

## 2019-11-22 NOTE — Telephone Encounter (Signed)
Forms are located in your provider box

## 2019-11-23 DIAGNOSIS — H4921 Sixth [abducent] nerve palsy, right eye: Secondary | ICD-10-CM | POA: Diagnosis not present

## 2019-11-23 LAB — HM DIABETES EYE EXAM

## 2019-11-26 ENCOUNTER — Other Ambulatory Visit: Payer: Self-pay

## 2019-11-26 ENCOUNTER — Encounter: Payer: Self-pay | Admitting: Emergency Medicine

## 2019-11-26 ENCOUNTER — Telehealth: Payer: Self-pay | Admitting: Emergency Medicine

## 2019-11-26 ENCOUNTER — Ambulatory Visit: Payer: 59 | Admitting: Emergency Medicine

## 2019-11-26 VITALS — BP 137/85 | HR 73 | Temp 98.0°F | Resp 16 | Ht 74.0 in | Wt 269.3 lb

## 2019-11-26 DIAGNOSIS — Z6834 Body mass index (BMI) 34.0-34.9, adult: Secondary | ICD-10-CM

## 2019-11-26 DIAGNOSIS — I10 Essential (primary) hypertension: Secondary | ICD-10-CM | POA: Diagnosis not present

## 2019-11-26 DIAGNOSIS — E1169 Type 2 diabetes mellitus with other specified complication: Secondary | ICD-10-CM | POA: Diagnosis not present

## 2019-11-26 DIAGNOSIS — E1159 Type 2 diabetes mellitus with other circulatory complications: Secondary | ICD-10-CM

## 2019-11-26 DIAGNOSIS — H4921 Sixth [abducent] nerve palsy, right eye: Secondary | ICD-10-CM | POA: Diagnosis not present

## 2019-11-26 DIAGNOSIS — E785 Hyperlipidemia, unspecified: Secondary | ICD-10-CM

## 2019-11-26 NOTE — Patient Instructions (Addendum)
   If you have lab work done today you will be contacted with your lab results within the next 2 weeks.  If you have not heard from us then please contact us. The fastest way to get your results is to register for My Chart.   IF you received an x-ray today, you will receive an invoice from [facility]. Please contact [facility] at [phone] with questions or concerns regarding your invoice.   IF you received labwork today, you will receive an invoice from [facility]. Please contact [facility] at [phone] with questions or concerns regarding your invoice.   Our billing staff will not be able to assist you with questions regarding bills from these companies.  You will be contacted with the lab results as soon as they are available. The fastest way to get your results is to activate your My Chart account. Instructions are located on the last page of this paperwork. If you have not heard from us regarding the results in 2 weeks, please contact this office.       Mantenimiento de la salud en los hombres Health Maintenance, Male Adoptar un estilo de vida saludable y recibir atencin preventiva son importantes para promover la salud y el bienestar. Consulte al mdico sobre:  El esquema adecuado para hacerse pruebas y exmenes peridicos.  Cosas que puede hacer por su cuenta para prevenir enfermedades y mantenerse sano. Qu debo saber sobre la dieta, el peso y el ejercicio? Consuma una dieta saludable   Consuma una dieta que incluya muchas verduras, frutas, productos lcteos con bajo contenido de grasa y protenas magras.  No consuma muchos alimentos ricos en grasas slidas, azcares agregados o sodio. Mantenga un peso saludable El ndice de masa muscular (IMC) es una medida que puede utilizarse para identificar posibles problemas de peso. Proporciona una estimacin de la grasa corporal basndose en el peso y la altura. Su mdico puede ayudarle a determinar su IMC y  a lograr o mantener un peso saludable. Haga ejercicio con regularidad Haga ejercicio con regularidad. Esta es una de las prcticas ms importantes que puede hacer por su salud. La mayora de los adultos deben seguir estas pautas:  Realizar, al menos, 150minutos de actividad fsica por semana. El ejercicio debe aumentar la frecuencia cardaca y hacerlo transpirar (ejercicio de intensidad moderada).  Hacer ejercicios de fortalecimiento por lo menos dos veces por semana. Agregue esto a su plan de ejercicio de intensidad moderada.  Pasar menos tiempo sentados. Incluso la actividad fsica ligera puede ser beneficiosa. Controle sus niveles de colesterol y lpidos en la sangre Comience a realizarse anlisis de lpidos y colesterol en la sangre a los 20aos y luego reptalos cada 5aos. Es posible que necesite controlar los niveles de colesterol con mayor frecuencia si:  Sus niveles de lpidos y colesterol son altos.  Es mayor de 40aos.  Presenta un alto riesgo de padecer enfermedades cardacas. Qu debo saber sobre las pruebas de deteccin del cncer? Muchos tipos de cncer pueden detectarse de manera temprana y, a menudo, pueden prevenirse. Segn su historia clnica y sus antecedentes familiares, es posible que deba realizarse pruebas de deteccin del cncer en diferentes edades. Esto puede incluir pruebas de deteccin de lo siguiente:  Cncer colorrectal.  Cncer de prstata.  Cncer de piel.  Cncer de pulmn. Qu debo saber sobre la enfermedad cardaca, la diabetes y la hipertensin arterial? Presin arterial y enfermedad cardaca  La hipertensin arterial causa enfermedades cardacas y aumenta el riesgo de accidente cerebrovascular. Es ms   probable que esto se manifieste en las personas que tienen lecturas de presin arterial alta, tienen ascendencia africana o tienen sobrepeso.  Hable con el mdico sobre sus valores de presin arterial deseados.  Hgase controlar la presin  arterial: ? Cada 3 a 5 aos si tiene entre 18 y 39 aos. ? Todos los aos si es mayor de 40aos.  Si tiene entre 65 y 75 aos y es fumador o sola fumar, pregntele al mdico si debe realizarse una prueba de deteccin de aneurisma artico abdominal (AAA) por nica vez. Diabetes Realcese exmenes de deteccin de la diabetes con regularidad. Este anlisis revisa el nivel de azcar en la sangre en ayunas. Hgase las pruebas de deteccin:  Cada tresaos despus de los 45aos de edad si tiene un peso normal y un bajo riesgo de padecer diabetes.  Con ms frecuencia y a partir de una edad inferior si tiene sobrepeso o un alto riesgo de padecer diabetes. Qu debo saber sobre la prevencin de infecciones? Hepatitis B Si tiene un riesgo ms alto de contraer hepatitis B, debe someterse a un examen de deteccin de este virus. Hable con el mdico para averiguar si tiene riesgo de contraer la infeccin por hepatitis B. Hepatitis C Se recomienda un anlisis de sangre para:  Todos los que nacieron entre 1945 y 1965.  Todas las personas que tengan un riesgo de haber contrado hepatitis C. Enfermedades de transmisin sexual (ETS)  Debe realizarse pruebas de deteccin de ITS todos los aos, incluidas la gonorrea y la clamidia, si: ? Es sexualmente activo y es menor de 24aos. ? Es mayor de 24aos, y el mdico le informa que corre riesgo de tener este tipo de infecciones. ? La actividad sexual ha cambiado desde que le hicieron la ltima prueba de deteccin y tiene un riesgo mayor de tener clamidia o gonorrea. Pregntele al mdico si usted tiene riesgo.  Pregntele al mdico si usted tiene un alto riesgo de contraer VIH. El mdico tambin puede recomendarle un medicamento recetado para ayudar a evitar la infeccin por el VIH. Si elige tomar medicamentos para prevenir el VIH, primero debe hacerse los anlisis de deteccin del VIH. Luego debe hacerse anlisis cada 3meses mientras est tomando los  medicamentos. Siga estas instrucciones en su casa: Estilo de vida  No consuma ningn producto que contenga nicotina o tabaco, como cigarrillos, cigarrillos electrnicos y tabaco de mascar. Si necesita ayuda para dejar de fumar, consulte al mdico.  No consuma drogas.  No comparta agujas.  Solicite ayuda a su mdico si necesita apoyo o informacin para abandonar las drogas. Consumo de alcohol  No beba alcohol si el mdico se lo prohbe.  Si bebe alcohol: ? Limite la cantidad que consume de 0 a 2 medidas por da. ? Est atento a la cantidad de alcohol que hay en las bebidas que toma. En [place], una medida equivale a una botella de cerveza de 12oz (355ml), un vaso de vino de 5oz (148ml) o un vaso de una bebida alcohlica de alta graduacin de 1oz (44ml). Instrucciones generales  Realcese los estudios de rutina de la salud, dentales y de la vista.  Mantngase al da con las vacunas.  Infrmele a su mdico si: ? Se siente deprimido con frecuencia. ? Alguna vez ha sido vctima de maltrato o no se siente seguro en su casa. Resumen  Adoptar un estilo de vida saludable y recibir atencin preventiva son importantes para promover la salud y el bienestar.  Siga las instrucciones del mdico   acerca de una dieta saludable, el ejercicio y la realizacin de pruebas o exmenes para detectar enfermedades.  Siga las instrucciones del mdico con respecto al control del colesterol y la presin arterial. Esta informacin no tiene como fin reemplazar el consejo del mdico. Asegrese de hacerle al mdico cualquier pregunta que tenga. Document Revised: 03/22/2018 Document Reviewed: 03/22/2018 Elsevier Patient Education  2020 [employer].  

## 2019-11-26 NOTE — Addendum Note (Signed)
Addended by: Davina Poke on: 11/26/2019 01:13 PM   Modules accepted: Orders, SmartSet

## 2019-11-26 NOTE — Progress Notes (Addendum)
James Garcia 64 y.o.   Chief Complaint  Patient presents with  . Advice Only    pt would like to discuss ED and what the notes and results meant as he doesnt understand   . Back Pain    pt complains of burning pain in he Right lower back, notes no urinary symptoms, notes it is a burning pain     HISTORY OF PRESENT ILLNESS: This is a 64 y.o. male diabetic, hypertensive, with dyslipidemia seen in emergency department on 11/19/2019 when he presented with gait abnormality and diplopia and physical findings showed isolated right abducens nerve palsy.  Work-up which included MRI-MRA of brain was unremarkable.  Also had blood work, chest x-ray, CT brain which were all within normal limits.  Was also evaluated in the emergency room by the neuro hospitalist.  Was referred to ophthalmologist whom he saw last Friday.  No significant findings by the ophthalmologist.  No new symptoms today however symptoms are still present with minimal improvement. Evaluation by neuro hospitalist as follows: IMPRESSION: 1. No acute intracranial abnormality. 2. Normal intracranial MRA. 3. Mild chronic small vessel ischemic changes. Impression: This is a 64 year old gentleman with a past medical history significant for diabetes (well controlled), hypertension, hyperlipidemia, presenting with 4 days of double vision as well as a headache.  There are no red flag features on headache history or examination, and I favor headache to represent a tension headache.  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  The most likely etiology in a patient of this profile is microvascular disease.  However given the possible progression over the course of 4 days, he needs neuroimaging to rule out aneurysm or inflammatory lesion. I will also obtain chest x-ray to evaluate for sarcoidosis, as well as some basic serologies for inflammatory etiologies.    Recommendations: -ESR, RPR, TSH (ordered)  -MRI brain with and  without contrast -MRA head -Chest x-ray for eval for sarcoidosis  -Outpatient referral to ophthalmology for full evaluation of detailed visual fields, dilated eye exam and prism glasses  Lesleigh Noe MD-PhD [facility] [phone]  Addendum:   Gait evaluation performed at approximately 6 AM, as documented above.  Patient reports he feels comfortable with discharge home with regards to his gait, and has a support he needs to get to outpatient physical therapy/Occupational Therapy to support safe ambulation.  He asked about driving and was counseled to avoid driving given the severity of his diplopia and symptoms anytime he looks to the right.  He was asked to follow-up with ophthalmology for clearance to drive.  ESR and TSH normal, A1c 7.3%, RPR pending  Chest x-ray personally reviewed and without concern for sarcoidosis  MRI brain and MRA personally reviewed and without acute stroke, suspicious enhancement or significant aneurysm No further inpatient neurological workup needed  Lesleigh Noe MD-PhD [facility] [phone] HPI   Prior to Admission medications   Medication Sig Start Date End Date Taking? Authorizing Provider  amLODipine (NORVASC) 5 MG tablet TAKE 1 TABLET BY MOUTH ONCE A DAY 09/18/19  Yes Sagardia, Ines Bloomer, MD  atorvastatin (LIPITOR) 20 MG tablet TAKE 1 TABLET BY MOUTH ONCE A DAY 09/18/19  Yes Sagardia, Ines Bloomer, MD  Blood Glucose Monitoring Suppl (FREESTYLE LITE) DEVI 1 kit by Does not apply route daily. Use to test blood sugars daily 04/14/18  Yes Renato Shin, MD  Blood Pressure Monitor KIT Use as instructed for HTN 07/12/17  Yes Wendie Agreste, MD  cetirizine (  ZYRTEC) 10 MG tablet Take 10 mg by mouth daily. 08/30/19  Yes [provider]  gabapentin (NEURONTIN) 100 MG capsule TAKE 1 CAPSULE BY MOUTH TWICE A DAY 11/15/19  Yes Sagardia, Ines Bloomer, MD  glucose blood test strip  05/08/19  Yes [provider]    Insulin Glargine (BASAGLAR KWIKPEN) 100 UNIT/ML SOPN Inject 0.6 mLs (60 Units total) into the skin every morning. And pen needles 2/day 05/07/19  Yes Renato Shin, MD  Lancets (FREESTYLE) lancets USE TO CHECK BLOOD SUGAR 2 TIMES PER DAY. 11/02/17  Yes Renato Shin, MD  losartan (COZAAR) 100 MG tablet Take 1 tablet (100 mg total) by mouth daily. 11/30/17  Yes Wendie Agreste, MD  metFORMIN (GLUCOPHAGE-XR) 500 MG 24 hr tablet TAKE 4 TABLETS BY MOUTH DAILY WITH BREAKFAST. 11/12/19  Yes Renato Shin, MD  neomycin-bacitracin-polymyxin (NEOSPORIN) ointment Apply 1 application topically every 12 (twelve) hours. 09/20/19  Yes Rosemarie Ax, MD  omeprazole (PRILOSEC) 20 MG capsule TAKE 1 CAPSULE (20 MG TOTAL) BY MOUTH EVERY MORNING. 03/27/19  Yes Sagardia, Ines Bloomer, MD  Semaglutide, 1 MG/DOSE, (OZEMPIC, 1 MG/DOSE,) 2 MG/1.5ML SOPN Inject 1 mg into the skin once a week. 04/04/19  Yes Renato Shin, MD  sulfamethoxazole-trimethoprim (BACTRIM DS) 800-160 MG tablet Take 1 tablet by mouth 2 (two) times daily. 09/24/19  Yes Rosemarie Ax, MD  UNIFINE PENTIPS 31G X 8 MM MISC USE WITH INSULIN TWICE DAILY AS DIRECTED 09/13/19  Yes [provider]  fluticasone (FLONASE) 50 MCG/ACT nasal spray Place 1-2 sprays into both nostrils daily for 7 days. 08/29/19 09/05/19  Wieters, Hallie C, PA-C  albuterol (PROVENTIL HFA;VENTOLIN HFA) 108 (90 Base) MCG/ACT inhaler Inhale 1-2 puffs into the lungs every 4 (four) hours as needed for wheezing or shortness of breath. 12/24/17 08/29/19  Wendie Agreste, MD    No Known Allergies  Patient Active Problem List   Diagnosis Date Noted  . Left leg pain 09/24/2019  . Diabetes ([place]) 05/01/2015  . Thrombocytopenia ([place]) 06/04/2014    Past Medical History:  Diagnosis Date  . Constipation    uses OTC laxatives - hard stools every morning- small amount and feels like doesnt empty   . Diabetes mellitus without complication ([place])   . DM type 2 (diabetes mellitus, type 2)  ([place])   . Hyperlipidemia   . Hypertension   . Leg cramps   . 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 Tuberculosis    pt states was treated for TB    Past Surgical History:  Procedure Laterality Date  . arm surgery    . COLONOSCOPY    . ELBOW SURGERY    . FOOT SURGERY Bilateral   . POLYPECTOMY      Social History   Socioeconomic History  . Marital status: Single    Spouse name: Not on file  . Number of children: Not on file  . Years of education: Not on file  . Highest education level: Not on file  Occupational History  . Occupation: Engineer, maintenance (IT) at [employer]  . Smoking status: Former Smoker    Packs/day: 0.25    Years: 5.00    Pack years: 1.25    Types: Cigarettes    Quit date: 06/04/1978    Years since quitting: 41.5  . Smokeless tobacco: Never Used  Vaping Use  . Vaping Use: Never used  Substance and Sexual Activity  . Alcohol use: No  . Drug use: No  . Sexual  activity: Not on file  Other Topics Concern  . Not on file  Social History Narrative   ** Merged History Encounter **       Social Determinants of Health   Financial Resource Strain:   . Difficulty of Paying Living Expenses: Not on file  Food Insecurity:   . Worried About Charity fundraiser in the Last Year: Not on file  . Ran Out of Food in the Last Year: Not on file  Transportation Needs:   . Lack of Transportation (Medical): Not on file  . Lack of Transportation (Non-Medical): Not on file  Physical Activity:   . Days of Exercise per Week: Not on file  . Minutes of Exercise per Session: Not on file  Stress:   . Feeling of Stress : Not on file  Social Connections:   . Frequency of Communication with Friends and Family: Not on file  . Frequency of Social Gatherings with Friends and Family: Not on file  . Attends Religious Services: Not on file  . Active Member of Clubs or Organizations: Not on file  . Attends Archivist Meetings: Not on file  . Marital Status: Not on file    Intimate Partner Violence:   . Fear of Current or Ex-Partner: Not on file  . Emotionally Abused: Not on file  . Physically Abused: Not on file  . Sexually Abused: Not on file    Family History  Problem Relation Age of Onset  . Diabetes Father   . Colon cancer Neg Hx   . Colon polyps Neg Hx   . Rectal cancer Neg Hx   . Stomach cancer Neg Hx      Review of Systems  Constitutional: Negative.  Negative for chills and fever.  HENT: Negative.  Negative for congestion, hearing loss and sore throat.   Eyes: Positive for double vision. Negative for pain, discharge and redness.  Respiratory: Negative.  Negative for cough, hemoptysis and shortness of breath.   Cardiovascular: Negative.  Negative for chest pain and palpitations.  Gastrointestinal: Negative.  Negative for abdominal pain, diarrhea, nausea and vomiting.  Genitourinary: Negative.  Negative for dysuria and hematuria.  Skin: Negative.  Negative for rash.  Neurological: Negative for dizziness, sensory change, speech change, focal weakness, seizures, loss of consciousness and headaches.  All other systems reviewed and are negative.  Today's Vitals   11/26/19 1054  BP: 137/85  Pulse: 73  Resp: 16  Temp: 98 F (36.7 C)  TempSrc: Temporal  SpO2: 97%  Weight: 269 lb 4.8 oz (122.2 kg)  Height: 6' 2" (1.88 m)   Body mass index is 34.58 kg/m.   Physical Exam Vitals reviewed.  Constitutional:      Appearance: Normal appearance. He is obese.  HENT:     Head: Normocephalic.  Eyes:     Conjunctiva/sclera: Conjunctivae normal.     Pupils: Pupils are equal, round, and reactive to light.     Comments: Right-sided abducens nerve palsy  Cardiovascular:     Rate and Rhythm: Normal rate and regular rhythm.     Pulses: Normal pulses.     Heart sounds: Normal heart sounds.  Pulmonary:     Effort: Pulmonary effort is normal.     Breath sounds: Normal breath sounds.  Musculoskeletal:        General: Normal range of motion.   Skin:    General: Skin is warm and dry.  Neurological:     General: No focal deficit present.  Mental Status: He is alert and oriented to person, place, and time.     Sensory: No sensory deficit.     Motor: No weakness.     Gait: Gait normal.  Psychiatric:        Mood and Affect: Mood normal.        Behavior: Behavior normal.    A total of 30 minutes was spent with the patient, greater than 50% of which was in counseling/coordination of care regarding differential diagnosis of abducens nerve palsy, review of emergency room visit notes, review of diagnostic imaging, review of blood work results, review of chest x-ray, review of most recent office visit note, need for neurology evaluation and follow-up, prognosis and follow-up with me.   ASSESSMENT & PLAN: Sunny was seen today for advice only and back pain.  Diagnoses and all orders for this visit:  Right abducens nerve palsy -     CBC with Differential/Platelet -     Comprehensive metabolic panel -     Ambulatory referral to Neurology  Hypertension associated with type 2 diabetes mellitus ([place]) -     CBC with Differential/Platelet -     Comprehensive metabolic panel  Dyslipidemia associated with type 2 diabetes mellitus ([place])    Patient Instructions       If you have lab work done today you will be contacted with your lab results within the next 2 weeks.  If you have not heard from [place] then please contact us. The fastest way to get your results is to register for My Chart.   IF you received an x-ray today, you will receive an invoice from [facility]. Please contact [facility] at [phone] with questions or concerns regarding your invoice.   IF you received labwork today, you will receive an invoice from [facility]. Please contact [facility] at [phone] with questions or concerns regarding your invoice.   Our billing staff will not be able to assist you with questions regarding bills from these  companies.  You will be contacted with the lab results as soon as they are available. The fastest way to get your results is to activate your My Chart account. Instructions are located on the last page of this paperwork. If you have not heard from [place] regarding the results in 2 weeks, please contact this office.     Mantenimiento de Teacher, English as a foreign language, Male Adoptar un estilo de vida saludable y recibir atencin preventiva son importantes para promover la salud y Musician. Consulte al mdico sobre:  El esquema adecuado para hacerse pruebas y exmenes peridicos.  Cosas que puede hacer por su cuenta para prevenir enfermedades y Chamita sano. Qu debo saber sobre la dieta, el peso y el ejercicio? Consuma una dieta saludable   Consuma una dieta que incluya muchas verduras, frutas, productos lcteos con bajo contenido de [place] y Advertising account planner.  No consuma muchos alimentos ricos en grasas slidas, azcares agregados o sodio. Mantenga un peso saludable El ndice de masa muscular [facility]) es una medida que puede utilizarse para identificar posibles problemas de [place]. Proporciona una estimacin de la grasa corporal basndose en el peso y la altura. Su mdico puede ayudarle a Radiation protection practitioner [place] y a Scientist, forensic o Theatre manager un peso saludable. Haga ejercicio con regularidad Haga ejercicio con regularidad. Esta es una de las prcticas ms importantes que puede hacer por su salud. La mayora de los adultos deben seguir estas pautas:  Optometrist, al menos, 176mnutos de actividad fsica por  semana. El ejercicio debe aumentar la frecuencia cardaca y Nature conservation officer transpirar (ejercicio de intensidad moderada).  Hacer ejercicios de fortalecimiento por lo [employer] por semana. Agregue esto a su plan de ejercicio de intensidad moderada.  Pasar menos tiempo sentados. Incluso la actividad fsica ligera puede ser beneficiosa. Controle sus niveles de colesterol y lpidos en la  sangre Comience a realizarse anlisis de lpidos y Research officer, trade union en la sangre a los 20aos y luego reptalos cada 5aos. Es posible que Automotive engineer los niveles de colesterol con mayor frecuencia si:  Sus niveles de lpidos y colesterol son altos.  Es mayor de 40aos.  Presenta un alto riesgo de padecer enfermedades cardacas. Qu debo saber sobre las pruebas de deteccin del cncer? Muchos tipos de cncer pueden detectarse de manera temprana y, a menudo, pueden prevenirse. Segn su historia clnica y sus antecedentes familiares, es posible que deba realizarse pruebas de deteccin del cncer en diferentes edades. Esto puede incluir pruebas de deteccin de lo siguiente:  Surveyor, minerals.  Cncer de prstata.  Cncer de piel.  Cncer de pulmn. Qu debo saber sobre la enfermedad cardaca, la diabetes y la hipertensin arterial? Presin arterial y enfermedad cardaca  La hipertensin arterial causa enfermedades cardacas y [place] el riesgo de accidente cerebrovascular. Es ms probable que esto se manifieste en las personas que tienen lecturas de presin arterial alta, tienen ascendencia africana o tienen sobrepeso.  Hable con el mdico sobre sus valores de presin arterial deseados.  Hgase controlar la presin arterial: ? Cada 3 a 5 aos si tiene entre 18 y 20 aos. ? Todos los aos si es mayor de [place].  Si tiene entre 30 y 58 aos y es fumador o Insurance account manager, pregntele al mdico si debe realizarse una prueba de deteccin de aneurisma artico abdominal (AAA) por nica vez. Diabetes Realcese exmenes de deteccin de la diabetes con regularidad. Este anlisis revisa el nivel de azcar en la sangre en [place]. Hgase las pruebas de deteccin:  Cada tresaos despus de los 106aos de edad si tiene un peso normal y un bajo riesgo de padecer diabetes.  Con ms frecuencia y a partir de [place] edad inferior si tiene sobrepeso o un alto riesgo de padecer diabetes. Qu debo  saber sobre la prevencin de infecciones? Hepatitis B Si tiene un riesgo ms alto de contraer hepatitis B, debe someterse a un examen de deteccin de este virus. Hable con el mdico para averiguar si tiene riesgo de contraer la infeccin por hepatitis B. Hepatitis C Se recomienda un anlisis de [place] para:  Todos los que nacieron entre 1945 y [phone].  Todas las personas que tengan un riesgo de haber contrado hepatitis C. Enfermedades de transmisin sexual (ETS)  Debe realizarse pruebas de deteccin de ITS todos los aos, incluidas la gonorrea y la clamidia, si: ? Es sexualmente activo y es menor de 24aos. ? Es mayor de 24aos, y Investment banker, operational informa que corre riesgo de tener este tipo de infecciones. ? La actividad sexual ha cambiado desde que le hicieron la ltima prueba de deteccin y tiene un riesgo mayor de Best boy clamidia o Radio broadcast assistant. Pregntele al mdico si usted tiene riesgo.  Pregntele al mdico si usted tiene un alto riesgo de Museum/gallery curator VIH. El mdico tambin puede recomendarle un medicamento recetado para ayudar a evitar la infeccin por el VIH. Si elige tomar medicamentos para prevenir el VIH, primero debe [employer] de deteccin del VIH. Luego debe hacerse anlisis cada 59mses mientras est tomando los medicamentos.  Siga estas instrucciones en su casa: Estilo de vida  No consuma ningn producto que contenga nicotina o tabaco, como cigarrillos, cigarrillos electrnicos y tabaco de Higher education careers adviser. Si necesita ayuda para dejar de fumar, consulte al mdico.  No consuma drogas.  No comparta agujas.  Solicite ayuda a su mdico si necesita apoyo o informacin para abandonar las drogas. Consumo de alcohol  No beba alcohol si el mdico se lo prohbe.  Si bebe alcohol: ? Limite la cantidad que consume de 0 a 2 medidas por da. ? Est atento a la cantidad de alcohol que hay en las bebidas que toma. En los [place], una medida equivale a una botella de cerveza de 12oz  (340m), un vaso de vino de 5oz (1418m o un vaso de una bebida alcohlica de alta graduacin de 1oz (4436m Instrucciones generales  Realcese los estudios de rutina de la salud, dentales y de la Public librarianManMasthopeInfrmele a su mdico si: ? Se siente deprimido con frecuencia. ? Alguna vez ha sido vctima de malHillandaleno se siente seguro en su casa. Resumen  Adoptar un estilo de vida saludable y recibir atencin preventiva son importantes para promover la salud y el MusicianSiga las instrucciones del mdico acerca de una dieta saludable, el ejercicio y la realizacin de pruebas o exmenes para detEngineer, building servicesSiga las instrucciones del mdico con respecto al control del colesterol y la presin arterial. Esta informacin no tiene comMarine scientist consejo del mdico. Asegrese de hacerle al mdico cualquier pregunta que tenga. Document Revised: 03/22/2018 Document Reviewed: 03/22/2018 Elsevier Patient Education  2020 Elsevier Inc.      MigAgustina CaroliD [facility]

## 2019-11-26 NOTE — Telephone Encounter (Signed)
FMLA paperwork form [employer] came via fax. Placed paperwork to be completed in provider/cma box

## 2019-11-27 ENCOUNTER — Telehealth: Payer: Self-pay | Admitting: *Deleted

## 2019-11-27 ENCOUNTER — Encounter: Payer: Self-pay | Admitting: Emergency Medicine

## 2019-11-27 DIAGNOSIS — H4921 Sixth [abducent] nerve palsy, right eye: Secondary | ICD-10-CM | POA: Insufficient documentation

## 2019-11-27 LAB — CBC WITH DIFFERENTIAL/PLATELET
Basophils Absolute: 0.1 10*3/uL (ref 0.0–0.2)
Basos: 1 %
EOS (ABSOLUTE): 0.2 10*3/uL (ref 0.0–0.4)
Eos: 2 %
Hematocrit: 47.9 % (ref 37.5–51.0)
Hemoglobin: 16.6 g/dL (ref 13.0–17.7)
Immature Grans (Abs): 0 10*3/uL (ref 0.0–0.1)
Immature Granulocytes: 0 %
Lymphocytes Absolute: 1.6 10*3/uL (ref 0.7–3.1)
Lymphs: 24 %
MCH: 30.5 pg (ref 26.6–33.0)
MCHC: 34.7 g/dL (ref 31.5–35.7)
MCV: 88 fL (ref 79–97)
Monocytes Absolute: 0.6 10*3/uL (ref 0.1–0.9)
Monocytes: 9 %
Neutrophils Absolute: 4.3 10*3/uL (ref 1.4–7.0)
Neutrophils: 64 %
Platelets: 156 10*3/uL (ref 150–450)
RBC: 5.44 x10E6/uL (ref 4.14–5.80)
RDW: 13.1 % (ref 11.6–15.4)
WBC: 6.7 10*3/uL (ref 3.4–10.8)

## 2019-11-27 LAB — COMPREHENSIVE METABOLIC PANEL
ALT: 16 IU/L (ref 0–44)
AST: 14 IU/L (ref 0–40)
Albumin/Globulin Ratio: 1.7 (ref 1.2–2.2)
Albumin: 4.3 g/dL (ref 3.8–4.8)
Alkaline Phosphatase: 82 IU/L (ref 44–121)
BUN/Creatinine Ratio: 18 (ref 10–24)
BUN: 17 mg/dL (ref 8–27)
Bilirubin Total: 0.5 mg/dL (ref 0.0–1.2)
CO2: 23 mmol/L (ref 20–29)
Calcium: 9.3 mg/dL (ref 8.6–10.2)
Chloride: 103 mmol/L (ref 96–106)
Creatinine, Ser: 0.93 mg/dL (ref 0.76–1.27)
GFR calc Af Amer: 100 mL/min/{1.73_m2} (ref >59)
GFR calc non Af Amer: 86 mL/min/{1.73_m2} (ref >59)
Globulin, Total: 2.5 g/dL (ref 1.5–4.5)
Glucose: 89 mg/dL (ref 65–99)
Potassium: 4.6 mmol/L (ref 3.5–5.2)
Sodium: 140 mmol/L (ref 134–144)
Total Protein: 6.8 g/dL (ref 6.0–8.5)

## 2019-11-27 NOTE — Telephone Encounter (Signed)
FYI

## 2019-11-27 NOTE — Telephone Encounter (Signed)
Done. Thanks.

## 2019-11-27 NOTE — Telephone Encounter (Signed)
Faxed completed FMLA forms to [employer]. Confirmation page 8:37 am.

## 2019-11-28 ENCOUNTER — Encounter: Payer: Self-pay | Admitting: Neurology

## 2019-11-28 ENCOUNTER — Other Ambulatory Visit: Payer: Self-pay

## 2019-11-28 ENCOUNTER — Ambulatory Visit (INDEPENDENT_AMBULATORY_CARE_PROVIDER_SITE_OTHER): Payer: 59 | Admitting: Neurology

## 2019-11-28 ENCOUNTER — Other Ambulatory Visit: Payer: Self-pay | Admitting: Emergency Medicine

## 2019-11-28 VITALS — BP 148/87 | HR 80 | Ht 74.0 in | Wt 269.0 lb

## 2019-11-28 DIAGNOSIS — I10 Essential (primary) hypertension: Secondary | ICD-10-CM

## 2019-11-28 DIAGNOSIS — H4921 Sixth [abducent] nerve palsy, right eye: Secondary | ICD-10-CM

## 2019-11-28 DIAGNOSIS — E785 Hyperlipidemia, unspecified: Secondary | ICD-10-CM

## 2019-11-28 DIAGNOSIS — Z794 Long term (current) use of insulin: Secondary | ICD-10-CM

## 2019-11-28 DIAGNOSIS — H532 Diplopia: Secondary | ICD-10-CM

## 2019-11-28 DIAGNOSIS — E1141 Type 2 diabetes mellitus with diabetic mononeuropathy: Secondary | ICD-10-CM | POA: Diagnosis not present

## 2019-11-28 DIAGNOSIS — G2581 Restless legs syndrome: Secondary | ICD-10-CM

## 2019-11-28 MED ORDER — PRAMIPEXOLE DIHYDROCHLORIDE 0.5 MG PO TABS: 0.5000 mg | ORAL_TABLET | Freq: Three times a day (TID) | ORAL | 5 refills | Status: AC

## 2019-11-28 MED FILL — PRAMIPEXOLE 0.5 MG TABLET: 0.5 | 10 days supply | Qty: 30 | Fill #0

## 2019-11-28 NOTE — Progress Notes (Signed)
[facility]  PATIENT: James Garcia DOB: [birth year]  REFERRING DOCTOR OR PCP: Agustina Caroli MD SOURCE: Patient, notes from PCP, emergency room notes, imaging and lab reports, MRI images personally reviewed.  _________________________________   HISTORICAL  CHIEF COMPLAINT:  Chief Complaint  Patient presents with  . Right abducens nerve palsy    rm 73, New Pt, Dgtr- James Garcia "right eye drooping x almost 2 weeks"    HISTORY OF PRESENT ILLNESS:  I had the pleasure of seeing patient, James Garcia, at [facility] for neurologic consultation regarding his diplopia.  He is a 64 year old man who had the onset of diplopia about 10 days ago.  Diplopia was binocular and seemed to occur in all gaze.   He had sharp right eye pain.  Pain was constant with some fluctuation but not related to moving eyes.   His eyelid seemed heavier.    Ibuprofen helped the pain but not the diplopia.   Pain resolved after a few days.  He also has felt off balanced since the diplopia started.   Compared to last week he is doing the same with vision and balance.    He has had mild numbness in his feet for the past couple years.   He notes some burning in his toes on both sides.   He frequently gets cramps in the legs.   Cramps bother him more at night and he feels better if he removes his legs.     He has not been sleeping as well recently.    He went to the ED 11/19/2019 and he had an MRI/MRA of the brain.   MRI showed mild chronic microvascular ischemic changes but no acute findings.  MR angiogram showed normal variant anatomy.  Labs showed normal/noncontributary ESR, TSH, CMP, RPR, B12.   HgbA1c was 7.3.      He has IDDM Type 2.    He also has well controlled essential hypertension and elevated cholesterol.   He has not smoked since his teen years.    REVIEW OF SYSTEMS: Constitutional: No fevers, chills, sweats, or change in appetite Eyes: As above. Ear, nose and throat: No  hearing loss, ear pain, nasal congestion, sore throat Cardiovascular: No chest pain, palpitations Respiratory: No shortness of breath at rest or with exertion.   No wheezes GastrointestinaI: No nausea, vomiting, diarrhea, abdominal pain, fecal incontinence Genitourinary: No dysuria, urinary retention or frequency.  No nocturia. Musculoskeletal: No neck pain, back pain Integumentary: No rash, pruritus, skin lesions Neurological: as above Psychiatric: No depression at this time.  No anxiety Endocrine: No palpitations, diaphoresis, change in appetite, change in weigh or increased thirst Hematologic/Lymphatic: No anemia, purpura, petechiae. Allergic/Immunologic: No itchy/runny eyes, nasal congestion, recent allergic reactions, rashes  ALLERGIES: No Known Allergies  HOME MEDICATIONS:  Current Outpatient Medications:  .  amLODipine (NORVASC) 5 MG tablet, TAKE 1 TABLET BY MOUTH ONCE A DAY, Disp: 90 tablet, Rfl: 0 .  atorvastatin (LIPITOR) 20 MG tablet, TAKE 1 TABLET BY MOUTH ONCE A DAY, Disp: 90 tablet, Rfl: 0 .  Blood Glucose Monitoring Suppl (FREESTYLE LITE) DEVI, 1 kit by Does not apply route daily. Use to test blood sugars daily, Disp: 1 each, Rfl: 0 .  Blood Pressure Monitor KIT, Use as instructed for HTN, Disp: 1 each, Rfl: 0 .  cetirizine (ZYRTEC) 10 MG tablet, Take 10 mg by mouth daily., Disp: , Rfl:  .  gabapentin (NEURONTIN) 100 MG capsule, TAKE 1 CAPSULE BY MOUTH TWICE A  DAY, Disp: 180 capsule, Rfl: 0 .  glucose blood test strip, , Disp: , Rfl:  .  Insulin Glargine (BASAGLAR KWIKPEN) 100 UNIT/ML SOPN, Inject 0.6 mLs (60 Units total) into the skin every morning. And pen needles 2/day, Disp: 20 pen, Rfl: 3 .  Lancets (FREESTYLE) lancets, USE TO CHECK BLOOD SUGAR 2 TIMES PER DAY., Disp: 200 each, Rfl: 2 .  losartan (COZAAR) 100 MG tablet, Take 1 tablet (100 mg total) by mouth daily., Disp: 90 tablet, Rfl: 0 .  metFORMIN (GLUCOPHAGE-XR) 500 MG 24 hr tablet, TAKE 4 TABLETS BY MOUTH  DAILY WITH BREAKFAST., Disp: 360 tablet, Rfl: 1 .  neomycin-bacitracin-polymyxin (NEOSPORIN) ointment, Apply 1 application topically every 12 (twelve) hours., Disp: 15 g, Rfl: 0 .  omeprazole (PRILOSEC) 20 MG capsule, TAKE 1 CAPSULE (20 MG TOTAL) BY MOUTH EVERY MORNING., Disp: 90 capsule, Rfl: 0 .  Semaglutide, 1 MG/DOSE, (OZEMPIC, 1 MG/DOSE,) 2 MG/1.5ML SOPN, Inject 1 mg into the skin once a week., Disp: 6 pen, Rfl: 3 .  sulfamethoxazole-trimethoprim (BACTRIM DS) 800-160 MG tablet, Take 1 tablet by mouth 2 (two) times daily., Disp: 14 tablet, Rfl: 0 .  UNIFINE PENTIPS 31G X 8 MM MISC, USE WITH INSULIN TWICE DAILY AS DIRECTED, Disp: , Rfl:  .  fluticasone (FLONASE) 50 MCG/ACT nasal spray, Place 1-2 sprays into both nostrils daily for 7 days., Disp: 1 g, Rfl: 0 .  pramipexole (MIRAPEX) 0.5 MG tablet, Take 1 tablet (0.5 mg total) by mouth 3 (three) times daily., Disp: 30 tablet, Rfl: 5  Current Facility-Administered Medications:  .  0.9 %  sodium chloride infusion, 500 mL, Intravenous, Once, Pyrtle, Lajuan Lines, MD  PAST MEDICAL HISTORY: Past Medical History:  Diagnosis Date  . Constipation    uses OTC laxatives - hard stools every morning- small amount and feels like doesnt empty   . Diabetes mellitus without complication ([place])   . DM type 2 (diabetes mellitus, type 2) ([place])   . Hyperlipidemia   . Hypertension   . Leg cramps   . 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 Tuberculosis    pt states was treated for TB    PAST SURGICAL HISTORY: Past Surgical History:  Procedure Laterality Date  . arm surgery    . COLONOSCOPY    . ELBOW SURGERY    . FOOT SURGERY Bilateral   . POLYPECTOMY      FAMILY HISTORY: Family History  Problem Relation Age of Onset  . Diabetes Father   . Colon cancer Neg Hx   . Colon polyps Neg Hx   . Rectal cancer Neg Hx   . Stomach cancer Neg Hx     SOCIAL HISTORY:  Social History   Socioeconomic History  . Marital status: Married    Spouse name: Not on  file  . Number of children: 2  . Years of education: Not on file  . Highest education level: Not on file  Occupational History  . Occupation: Engineer, maintenance (IT) at [employer]  . Smoking status: Former Smoker    Packs/day: 0.25    Years: 5.00    Pack years: 1.25    Types: Cigarettes    Quit date: 06/04/1978    Years since quitting: 41.5  . Smokeless tobacco: Never Used  Vaping Use  . Vaping Use: Never used  Substance and Sexual Activity  . Alcohol use: No  . Drug use: No  . Sexual activity: Not on file  Other Topics Concern  .  Not on file  Social History Narrative   Lives with family       Social Determinants of Health   Financial Resource Strain:   . Difficulty of Paying Living Expenses: Not on file  Food Insecurity:   . Worried About Charity fundraiser in the Last Year: Not on file  . Ran Out of Food in the Last Year: Not on file  Transportation Needs:   . Lack of Transportation (Medical): Not on file  . Lack of Transportation (Non-Medical): Not on file  Physical Activity:   . Days of Exercise per Week: Not on file  . Minutes of Exercise per Session: Not on file  Stress:   . Feeling of Stress : Not on file  Social Connections:   . Frequency of Communication with Friends and Family: Not on file  . Frequency of Social Gatherings with Friends and Family: Not on file  . Attends Religious Services: Not on file  . Active Member of Clubs or Organizations: Not on file  . Attends Archivist Meetings: Not on file  . Marital Status: Not on file  Intimate Partner Violence:   . Fear of Current or Ex-Partner: Not on file  . Emotionally Abused: Not on file  . Physically Abused: Not on file  . Sexually Abused: Not on file     PHYSICAL EXAM  Vitals:   11/28/19 1320  BP: (!) 148/87  Pulse: 80  Weight: 269 lb (122 kg)  Height: '6\' 2"'  (1.88 m)    Body mass index is 34.54 kg/m.   General: The patient is well-developed and well-nourished and in no acute  distress  HEENT:  Head is [place]/AT.  Sclera are anicteric.  Funduscopic exam shows normal optic discs and retinal vessels.  Neck: No carotid bruits are noted.  The neck is nontender.  Cardiovascular: The heart has a regular rate and rhythm with a normal S1 and S2. There were no murmurs, gallops or rubs.    Skin: Extremities are without rash or  edema.  Musculoskeletal:  Back is nontender  Neurologic Exam  Mental status: The patient is alert and oriented x 3 at the time of the examination. The patient has apparent normal recent and remote memory, with an apparently normal attention span and concentration ability.   Speech is normal.  Cranial nerves: Extraocular muscles appear normal in the left eye.  He has a complete right 6th nerve palsy.. Pupils are equal, round, and reactive to light and accomodation.  Visual fields are full.  Facial symmetry is present. There is good facial sensation to soft touch bilaterally.Facial strength is normal.  Trapezius and sternocleidomastoid strength is normal. No dysarthria is noted.  The tongue is midline, and the patient has symmetric elevation of the soft palate. No obvious hearing deficits are noted.  Motor:  Muscle bulk is normal.   Tone is normal. Strength is  5 / 5 in all 4 extremities.   Sensory: Sensory testing is intact to pinprick, soft touch and vibration sensation in the arms and knees.  He has reduced vibration sensation in the ankles and reduced vibration and touch/pinprick sensation in the feet.  He rates vibration about 50% at the toes compared to the knees.  Coordination: Cerebellar testing reveals good finger-nose-finger and heel-to-shin bilaterally.  Gait and station: Station is normal.   Gait is normal. Tandem gait is mildly wide. Romberg is negative.   Reflexes: Deep tendon reflexes are symmetric and normal bilaterally.   Plantar responses  are flexor.    DIAGNOSTIC DATA (LABS, IMAGING, TESTING) - I reviewed patient records, labs,  notes, testing and imaging myself where available.  Lab Results  Component Value Date   WBC 6.7 11/26/2019   HGB 16.6 11/26/2019   HCT 47.9 11/26/2019   MCV 88 11/26/2019   PLT 156 11/26/2019      Component Value Date/Time   NA 140 11/26/2019 1208   NA 138 06/04/2014 1112   K 4.6 11/26/2019 1208   K 4.0 06/04/2014 1112   CL 103 11/26/2019 1208   CO2 23 11/26/2019 1208   CO2 26 06/04/2014 1112   GLUCOSE 89 11/26/2019 1208   GLUCOSE 130 (H) 11/19/2019 2002   GLUCOSE 245 (H) 06/04/2014 1112   BUN 17 11/26/2019 1208   BUN 16.1 06/04/2014 1112   CREATININE 0.93 11/26/2019 1208   CREATININE 0.8 06/04/2014 1112   CALCIUM 9.3 11/26/2019 1208   CALCIUM 8.9 06/04/2014 1112   PROT 6.8 11/26/2019 1208   PROT 7.0 11/26/2019 1208   PROT 6.8 06/04/2014 1112   ALBUMIN 4.3 11/26/2019 1208   ALBUMIN 3.8 06/04/2014 1112   AST 14 11/26/2019 1208   AST 11 06/04/2014 1112   ALT 16 11/26/2019 1208   ALT 20 06/04/2014 1112   ALKPHOS 82 11/26/2019 1208   ALKPHOS 77 06/04/2014 1112   BILITOT 0.5 11/26/2019 1208   BILITOT 0.65 06/04/2014 1112   GFRNONAA 86 11/26/2019 1208   GFRAA 100 11/26/2019 1208   Lab Results  Component Value Date   CHOL 153 12/06/2018   HDL 51 12/06/2018   LDLCALC 88 12/06/2018   TRIG 73 12/06/2018   CHOLHDL 3.0 12/06/2018   Lab Results  Component Value Date   HGBA1C 7.3 (H) 11/20/2019   Lab Results  Component Value Date   VITAMINB12 399 11/26/2019   Lab Results  Component Value Date   TSH 2.226 11/20/2019       ASSESSMENT AND PLAN  Right abducens nerve palsy  Diplopia  Type 2 diabetes mellitus with diabetic mononeuropathy, with long-term current use of insulin (HCC)  Restless leg  In summary, Mr. Main is a 64 year old man who had the onset of a right abducens nerve palsy about 10 days ago.  The most likely etiology is diabetes mellitus.  Lab work including ESR was normal.  MRI did not reveal any stroke or other acute findings.  The mild  chronic microvascular ischemic changes are likely due to diabetes and hypertension.  We discussed the natural history of 6th nerve palsies.  They almost always improve, usually about 2 to 3 months after onset.  Unfortunately, there is not much we can do to speed up the recovery.  He is advised to wear patches when the diplopia can be distracting but not throughout the day.  He also appears to have restless leg syndrome and I wrote him a prescription for Mirapex.  He should take 1 pill at bedtime.  If no better after couple weeks he should stop.  I did not schedule a follow-up visit but advised him to call if he notes no improvement after another 6-8 weeks.  Thank you for asking me to see Mr. Lorusso.  Please let me know if I can be of further assistance with him or other patients in the future.   Damien Batty A. Felecia Shelling, MD, [facility] 6/57/9038, 3:33 PM Certified in Neurology, Clinical Neurophysiology, Sleep Medicine and Neuroimaging  [facility] [address], [place], [place] [address] [phone]

## 2019-11-29 ENCOUNTER — Telehealth: Payer: Self-pay | Admitting: Emergency Medicine

## 2019-11-29 NOTE — Telephone Encounter (Signed)
Pt called requesting lab results to be explained to him. Pt can view results on MyChart and is still experiencing pain. Pt would like to know if his labs indicate to anything.   Advised pt that provider has not signed off on results yet and CMA will return his call when information is released

## 2019-11-30 LAB — PROTEIN ELECTROPHORESIS, SERUM
A/G Ratio: 1.2 (ref 0.7–1.7)
Albumin ELP: 3.8 g/dL (ref 2.9–4.4)
Alpha 1: 0.2 g/dL (ref 0.0–0.4)
Alpha 2: 0.6 g/dL (ref 0.4–1.0)
Beta: 1.2 g/dL (ref 0.7–1.3)
Gamma Globulin: 1.2 g/dL (ref 0.4–1.8)
Globulin, Total: 3.2 g/dL (ref 2.2–3.9)
Total Protein: 7 g/dL (ref 6.0–8.5)

## 2019-11-30 LAB — VITAMIN B12: Vitamin B-12: 399 pg/mL (ref 232–1245)

## 2019-11-30 LAB — SPECIMEN STATUS REPORT

## 2019-12-04 ENCOUNTER — Encounter: Payer: Self-pay | Admitting: Emergency Medicine

## 2019-12-04 ENCOUNTER — Ambulatory Visit (INDEPENDENT_AMBULATORY_CARE_PROVIDER_SITE_OTHER): Payer: 59

## 2019-12-04 ENCOUNTER — Other Ambulatory Visit: Payer: Self-pay

## 2019-12-04 ENCOUNTER — Ambulatory Visit (INDEPENDENT_AMBULATORY_CARE_PROVIDER_SITE_OTHER): Payer: 59 | Admitting: Emergency Medicine

## 2019-12-04 VITALS — BP 144/84 | HR 74 | Temp 98.0°F | Ht 74.0 in | Wt 272.0 lb

## 2019-12-04 DIAGNOSIS — M549 Dorsalgia, unspecified: Secondary | ICD-10-CM

## 2019-12-04 DIAGNOSIS — R109 Unspecified abdominal pain: Secondary | ICD-10-CM | POA: Diagnosis not present

## 2019-12-04 DIAGNOSIS — Z6834 Body mass index (BMI) 34.0-34.9, adult: Secondary | ICD-10-CM | POA: Diagnosis not present

## 2019-12-04 DIAGNOSIS — M545 Low back pain: Secondary | ICD-10-CM | POA: Diagnosis not present

## 2019-12-04 LAB — POCT URINALYSIS DIP (MANUAL ENTRY)
Bilirubin, UA: NEGATIVE
Blood, UA: NEGATIVE
Glucose, UA: NEGATIVE mg/dL
Ketones, POC UA: NEGATIVE mg/dL
Leukocytes, UA: NEGATIVE
Nitrite, UA: NEGATIVE
Protein Ur, POC: NEGATIVE mg/dL
Spec Grav, UA: 1.025
Urobilinogen, UA: 0.2 U/dL
pH, UA: 5

## 2019-12-04 MED ORDER — TRAMADOL HCL 50 MG PO TABS: 50.0000 mg | ORAL_TABLET | Freq: Three times a day (TID) | ORAL | 0 refills | Status: AC | PRN

## 2019-12-04 MED FILL — traMADol HCL 50 MG TABS: 50 | 5 days supply | Qty: 15 | Fill #0

## 2019-12-04 NOTE — Telephone Encounter (Signed)
FMLA forms completed and faxed 11/27/2019. Patient was seen in the office today for follow up.

## 2019-12-04 NOTE — Progress Notes (Signed)
James Garcia 64 y.o.   Chief Complaint  Patient presents with  . R side middle back pian    15 days - burning pain intensifies with movement  . lab results       . FMLA    forms  R eye- neurology appt 9/15    HISTORY OF PRESENT ILLNESS: This is a 64 y.o. male complaining of right flank pain that started about 2 weeks ago.  Sharp constant pain worse with movement. No associated symptoms.  Denies urinary symptoms or hematuria. 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  Believed to be secondary to hypertension and diabetes.  Was evaluated in the emergency room initially and later by neurologist and ophthalmologist. FMLA papers completed last week. Recent blood results are unremarkable  HPI   Prior to Admission medications   Medication Sig Start Date End Date Taking? Authorizing Provider  amLODipine (NORVASC) 5 MG tablet TAKE 1 TABLET BY MOUTH ONCE A DAY 09/18/19  Yes Tylar Amborn, Ines Bloomer, MD  atorvastatin (LIPITOR) 20 MG tablet TAKE 1 TABLET BY MOUTH ONCE A DAY 09/18/19  Yes Kaylon Laroche, Ines Bloomer, MD  Blood Glucose Monitoring Suppl (FREESTYLE LITE) DEVI 1 kit by Does not apply route daily. Use to test blood sugars daily 04/14/18  Yes Renato Shin, MD  Blood Pressure Monitor KIT Use as instructed for HTN 07/12/17  Yes Wendie Agreste, MD  cetirizine (ZYRTEC) 10 MG tablet Take 10 mg by mouth daily. 08/30/19  Yes [provider]  gabapentin (NEURONTIN) 100 MG capsule TAKE 1 CAPSULE BY MOUTH TWICE A DAY 11/15/19  Yes Melesa Lecy, Ines Bloomer, MD  glucose blood test strip  05/08/19  Yes [provider]  Insulin Glargine (BASAGLAR KWIKPEN) 100 UNIT/ML SOPN Inject 0.6 mLs (60 Units total) into the skin every morning. And pen needles 2/day 05/07/19  Yes Renato Shin, MD  Lancets (FREESTYLE) lancets USE TO CHECK BLOOD SUGAR 2 TIMES PER DAY. 11/02/17  Yes Renato Shin, MD  losartan (COZAAR) 100 MG tablet  Take 1 tablet (100 mg total) by mouth daily. 11/30/17  Yes Wendie Agreste, MD  metFORMIN (GLUCOPHAGE-XR) 500 MG 24 hr tablet TAKE 4 TABLETS BY MOUTH DAILY WITH BREAKFAST. 11/12/19  Yes Renato Shin, MD  neomycin-bacitracin-polymyxin (NEOSPORIN) ointment Apply 1 application topically every 12 (twelve) hours. 09/20/19  Yes Rosemarie Ax, MD  omeprazole (PRILOSEC) 20 MG capsule TAKE 1 CAPSULE (20 MG TOTAL) BY MOUTH EVERY MORNING. 03/27/19  Yes Julieann Drummonds, Ines Bloomer, MD  pramipexole (MIRAPEX) 0.5 MG tablet Take 1 tablet (0.5 mg total) by mouth 3 (three) times daily. 11/28/19  Yes Sater, Nanine Means, MD  Semaglutide, 1 MG/DOSE, (OZEMPIC, 1 MG/DOSE,) 2 MG/1.5ML SOPN Inject 1 mg into the skin once a week. 04/04/19  Yes Renato Shin, MD  sulfamethoxazole-trimethoprim (BACTRIM DS) 800-160 MG tablet Take 1 tablet by mouth 2 (two) times daily. 09/24/19  Yes Rosemarie Ax, MD  UNIFINE PENTIPS 31G X 8 MM MISC USE WITH INSULIN TWICE DAILY AS DIRECTED 09/13/19  Yes [provider]  fluticasone (FLONASE) 50 MCG/ACT nasal spray Place 1-2 sprays into both nostrils daily for 7 days. 08/29/19 09/05/19  Wieters, Hallie C, PA-C  albuterol (PROVENTIL HFA;VENTOLIN HFA) 108 (90 Base) MCG/ACT inhaler Inhale 1-2 puffs into the lungs every 4 (four) hours as needed for wheezing or shortness of breath. 12/24/17 08/29/19  Wendie Agreste, MD    No Known Allergies  Patient Active Problem List  Diagnosis Date Noted  . Diplopia 11/28/2019  . Restless leg 11/28/2019  . Right abducens nerve palsy 11/27/2019  . Left leg pain 09/24/2019  . Diabetes ([place]) 05/01/2015    Past Medical History:  Diagnosis Date  . Constipation    uses OTC laxatives - hard stools every morning- small amount and feels like doesnt empty   . Diabetes mellitus without complication ([place])   . DM type 2 (diabetes mellitus, type 2) ([place])   . Hyperlipidemia   . Hypertension   . Leg cramps   . 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  Tuberculosis    pt states was treated for TB    Past Surgical History:  Procedure Laterality Date  . arm surgery    . COLONOSCOPY    . ELBOW SURGERY    . FOOT SURGERY Bilateral   . POLYPECTOMY      Social History   Socioeconomic History  . Marital status: Married    Spouse name: Not on file  . Number of children: 2  . Years of education: Not on file  . Highest education level: Not on file  Occupational History  . Occupation: Engineer, maintenance (IT) at [employer]  . Smoking status: Former Smoker    Packs/day: 0.25    Years: 5.00    Pack years: 1.25    Types: Cigarettes    Quit date: 06/04/1978    Years since quitting: 41.5  . Smokeless tobacco: Never Used  Vaping Use  . Vaping Use: Never used  Substance and Sexual Activity  . Alcohol use: No  . Drug use: No  . Sexual activity: Not on file  Other Topics Concern  . Not on file  Social History Narrative   Lives with family       Social Determinants of Health   Financial Resource Strain:   . Difficulty of Paying Living Expenses: Not on file  Food Insecurity:   . Worried About Charity fundraiser in the Last Year: Not on file  . Ran Out of Food in the Last Year: Not on file  Transportation Needs:   . Lack of Transportation (Medical): Not on file  . Lack of Transportation (Non-Medical): Not on file  Physical Activity:   . Days of Exercise per Week: Not on file  . Minutes of Exercise per Session: Not on file  Stress:   . Feeling of Stress : Not on file  Social Connections:   . Frequency of Communication with Friends and Family: Not on file  . Frequency of Social Gatherings with Friends and Family: Not on file  . Attends Religious Services: Not on file  . Active Member of Clubs or Organizations: Not on file  . Attends Archivist Meetings: Not on file  . Marital Status: Not on file  Intimate Partner Violence:   . Fear of Current or Ex-Partner: Not on file  . Emotionally Abused: Not on file  . Physically  Abused: Not on file  . Sexually Abused: Not on file    Family History  Problem Relation Age of Onset  . Diabetes Father   . Colon cancer Neg Hx   . Colon polyps Neg Hx   . Rectal cancer Neg Hx   . Stomach cancer Neg Hx      Review of Systems  Constitutional: Negative.  Negative for chills and fever.  HENT: Negative.  Negative for congestion, hearing loss and sore throat.   Respiratory: Negative.  Negative for cough and shortness of breath.   Cardiovascular: Negative.  Negative for chest pain and palpitations.  Gastrointestinal: Negative for abdominal pain, nausea and vomiting.  Genitourinary: Positive for flank pain.  Musculoskeletal: Positive for back pain.  Skin: Negative.  Negative for rash.  Neurological: Negative.  Negative for speech change, focal weakness, seizures and loss of consciousness.  All other systems reviewed and are negative.  Today's Vitals   12/04/19 1050 12/04/19 1104  BP: (!) 149/96 (!) 144/84  Pulse: 74   Temp: 98 F (36.7 C)   SpO2: 98%   Weight: 272 lb (123.4 kg)   Height: _0  (1.88 m)    Body mass index is 34.92 kg/m.   Physical Exam Vitals reviewed.  Constitutional:      Appearance: Normal appearance.  HENT:     Head: Normocephalic.  Cardiovascular:     Rate and Rhythm: Normal rate and regular rhythm.     Pulses: Normal pulses.     Heart sounds: Normal heart sounds.  Pulmonary:     Effort: Pulmonary effort is normal.     Breath sounds: Normal breath sounds.  Abdominal:     General: Bowel sounds are normal. There is no distension.     Palpations: Abdomen is soft.     Tenderness: There is no abdominal tenderness. There is no right CVA tenderness or left CVA tenderness.  Musculoskeletal:        General: Normal range of motion.     Cervical back: Normal range of motion.     Comments: Mild tenderness to right flank and lumbar paraspinous area with decreased range of motion.  No spine tenderness.  Skin:    General: Skin is warm and  dry.     Capillary Refill: Capillary refill takes less than 2 seconds.  Neurological:     General: No focal deficit present.     Mental Status: He is alert and oriented to person, place, and time.  Psychiatric:        Mood and Affect: Mood normal.        Behavior: Behavior normal.     DG Lumbar Spine 2-3 Views  Result Date: 12/04/2019 CLINICAL DATA:  Right lumbar pain EXAM: LUMBAR SPINE - 2-3 VIEW COMPARISON:  01/07/2006 FINDINGS: There is no evidence of lumbar spine fracture. Alignment is normal. Intervertebral disc spaces are maintained. Small anterior endplate spurs at all lumbar levels. IMPRESSION: Negative. Electronically Signed   By: Lucrezia Europe M.D.   On: 12/04/2019 11:31   Results for orders placed or performed in visit on 12/04/19 (from the past 24 hour(s))  POCT urinalysis dipstick     Status: None   Collection Time: 12/04/19 11:24 AM  Result Value Ref Range   Color, UA yellow yellow   Clarity, UA clear clear   Glucose, UA negative negative mg/dL   Bilirubin, UA negative negative   Ketones, POC UA negative negative mg/dL   Spec Grav, UA 1.025 1.010 - 1.025   Blood, UA negative negative   pH, UA 5.0 5.0 - 8.0   Protein Ur, POC negative negative mg/dL   Urobilinogen, UA 0.2 0.2 or 1.0 E.U./dL   Nitrite, UA Negative Negative   Leukocytes, UA Negative Negative    ASSESSMENT & PLAN: Ashaun was seen today for r side middle back pian, lab results and fmla.  Diagnoses and all orders for this visit:  Right flank pain -     POCT urinalysis dipstick -     DG  Lumbar Spine 2-3 Views -     US Renal; Future  Musculoskeletal back pain -     traMADol (ULTRAM) 50 MG tablet; Take 1 tablet (50 mg total) by mouth every 8 (eight) hours as needed for up to 5 days.    Patient Instructions       If you have lab work done today you will be contacted with your lab results within the next 2 weeks.  If you have not heard from [place] then please contact us. The fastest way to get your  results is to register for My Chart.   IF you received an x-ray today, you will receive an invoice from [facility]. Please contact [facility] at [phone] with questions or concerns regarding your invoice.   IF you received labwork today, you will receive an invoice from [facility]. Please contact [facility] at [phone] with questions or concerns regarding your invoice.   Our billing staff will not be able to assist you with questions regarding bills from these companies.  You will be contacted with the lab results as soon as they are available. The fastest way to get your results is to activate your My Chart account. Instructions are located on the last page of this paperwork. If you have not heard from [place] regarding the results in 2 weeks, please contact this office.      Dolor en la dolor en la fosa lumbar en adultos Flank Pain, Adult El dolor en la fosa lumbar es aquel dolor que se siente en un lado del cuerpo. El flanco es la zona que se localiza en un lado del cuerpo, entre la parte superior del vientre (abdomen) y la espalda. El Mining engineer en un perodo corto de [place] o puede durar mucho tiempo o reaparecer con frecuencia (crnico). Puede ser leve o muy intenso. El dolor en esta zona puede tener diferentes causas. Siga estas indicaciones en su casa:   Beba suficiente lquido como para mantener el pis (orina) claro o de color amarillo plido.  Haga reposo como se lo haya indicado el mdico.  Tome los medicamentos de venta libre y los recetados solamente como se lo haya indicado el mdico.  Realice un seguimiento por escrito de lo siguiente: ? Lo que le caus dolor en la fosa lumbar. ? Lo que lo alivi.  Concurra a todas las visitas de seguimiento como se lo haya indicado el mdico. Esto es importante. Comunquese con un mdico si:  Los medicamentos no Forensic psychologist.  Aparecen nuevos sntomas.  El dolor [place].  Tiene  fiebre.  Los sntomas duran ms de 2 a 3das.  Tiene dificultad para orinar.  Orina con ms frecuencia que lo normal. Solicite ayuda de inmediato si:  Tiene dificultad para respirar.  Le falta el aire.  Le duele el vientre, o este est hinchado o enrojecido.  Siente malestar estomacal (nuseas).  Vomita.  Siente que se desvanecer o pierde el conocimiento (se desmaya).  Observa sangre en la orina. Resumen  El dolor en la fosa lumbar es aquel dolor que se siente en un lado del cuerpo. El flanco es la zona que se localiza en un lado del cuerpo, entre la parte superior del vientre (abdomen) y la espalda.  El dolor en la fosa lumbar puede aparecer en un perodo corto de tiempo (agudo) o puede durar mucho tiempo o reaparecer con frecuencia (crnico). Puede ser leve o muy intenso.  El dolor en esta zona puede tener diferentes causas.  Comunquese con su mdico si los sntomas empeoran o si duran ms de 2 a 3 das. Esta informacin no tiene Marine scientist el consejo del mdico. Asegrese de hacerle al mdico cualquier pregunta que tenga. Document Revised: 09/07/2016 Document Reviewed: 09/07/2016 Elsevier Patient Education  2020 Elsevier Inc.      Agustina Caroli, MD [facility]

## 2019-12-04 NOTE — Patient Instructions (Addendum)
     If you have lab work done today you will be contacted with your lab results within the next 2 weeks.  If you have not heard from us then please contact us. The fastest way to get your results is to register for My Chart.   IF you received an x-ray today, you will receive an invoice from [facility]. Please contact [facility] at [phone] with questions or concerns regarding your invoice.   IF you received labwork today, you will receive an invoice from [facility]. Please contact [facility] at [phone] with questions or concerns regarding your invoice.   Our billing staff will not be able to assist you with questions regarding bills from these companies.  You will be contacted with the lab results as soon as they are available. The fastest way to get your results is to activate your My Chart account. Instructions are located on the last page of this paperwork. If you have not heard from us regarding the results in 2 weeks, please contact this office.     Dolor en la dolor en la fosa lumbar en adultos Flank Pain, Adult El dolor en la fosa lumbar es aquel dolor que se siente en un lado del cuerpo. El flanco es la zona que se localiza en un lado del cuerpo, entre la parte superior del vientre (abdomen) y la espalda. El dolor puede aparecer en un perodo corto de tiempo (agudo) o puede durar mucho tiempo o reaparecer con frecuencia (crnico). Puede ser leve o muy intenso. El dolor en esta zona puede tener diferentes causas. Siga estas indicaciones en su casa:   Beba suficiente lquido como para mantener el pis (orina) claro o de color amarillo plido.  Haga reposo como se lo haya indicado el mdico.  Tome los medicamentos de venta libre y los recetados solamente como se lo haya indicado el mdico.  Realice un seguimiento por escrito de lo siguiente: ? Lo que le caus dolor en la fosa lumbar. ? Lo que lo alivi.  Concurra a todas las visitas de seguimiento como se  lo haya indicado el mdico. Esto es importante. Comunquese con un mdico si:  Los medicamentos no le alivian el dolor.  Aparecen nuevos sntomas.  El dolor [place].  Tiene fiebre.  Los sntomas duran ms de 2 a 3das.  Tiene dificultad para orinar.  Orina con ms frecuencia que lo normal. Solicite ayuda de inmediato si:  Tiene dificultad para respirar.  Le falta el aire.  Le duele el vientre, o este est hinchado o enrojecido.  Siente malestar estomacal (nuseas).  Vomita.  Siente que se desvanecer o pierde el conocimiento (se desmaya).  Observa sangre en la orina. Resumen  El dolor en la fosa lumbar es aquel dolor que se siente en un lado del cuerpo. El flanco es la zona que se localiza en un lado del cuerpo, entre la parte superior del vientre (abdomen) y la espalda.  El dolor en la fosa lumbar puede aparecer en un perodo corto de tiempo (agudo) o puede durar mucho tiempo o reaparecer con frecuencia (crnico). Puede ser leve o muy intenso.  El dolor en esta zona puede tener diferentes causas.  Comunquese con su mdico si los sntomas empeoran o si duran ms de 2 a 3 das. Esta informacin no tiene como fin reemplazar el consejo del mdico. Asegrese de hacerle al mdico cualquier pregunta que tenga. Document Revised: 09/07/2016 Document Reviewed: 09/07/2016 Elsevier Patient Education  2020 Elsevier Inc.  

## 2019-12-05 ENCOUNTER — Telehealth: Payer: Self-pay | Admitting: *Deleted

## 2019-12-05 NOTE — Telephone Encounter (Signed)
On 12/04/2019, re faxed FMLA to [employer] Intake # [id number]. confirmation page 5:29 pm.

## 2019-12-13 DIAGNOSIS — H4921 Sixth [abducent] nerve palsy, right eye: Secondary | ICD-10-CM | POA: Diagnosis not present

## 2019-12-18 ENCOUNTER — Telehealth: Payer: Self-pay | Admitting: Emergency Medicine

## 2019-12-18 ENCOUNTER — Encounter: Payer: Self-pay | Admitting: Endocrinology

## 2019-12-18 ENCOUNTER — Ambulatory Visit (INDEPENDENT_AMBULATORY_CARE_PROVIDER_SITE_OTHER): Payer: 59 | Admitting: Endocrinology

## 2019-12-18 ENCOUNTER — Other Ambulatory Visit: Payer: Self-pay

## 2019-12-18 VITALS — BP 124/72 | HR 72 | Ht 74.0 in | Wt 272.4 lb

## 2019-12-18 DIAGNOSIS — E1141 Type 2 diabetes mellitus with diabetic mononeuropathy: Secondary | ICD-10-CM

## 2019-12-18 DIAGNOSIS — Z794 Long term (current) use of insulin: Secondary | ICD-10-CM

## 2019-12-18 NOTE — Patient Instructions (Addendum)
Please continue the same Basaglar, Ozempic, and metformin.     check your blood sugar twice a day.  vary the time of day when you check, between before the 3 meals, and at bedtime.  also check if you have symptoms of your blood sugar being too high or too low.  please keep a record of the readings and bring it to your next appointment here (or you can bring the meter itself).  You can write it on any piece of paper.  please call us sooner if your blood sugar goes below 70, or if you have a lot of readings over 200. Please come back for a follow-up appointment in 4 months.     Contine con el mismo Basaglar, Ozempic y metformin. controle su nivel de azcar en [place]. Vare la hora del da en que lo comprueba, entre antes de las 3 comidas y antes de [place]. Compruebe tambin si tiene sntomas de que su nivel de Location manager en sangre es demasiado alto o demasiado bajo. por favor mantenga un registro de las lecturas y Air cabin crew a su prxima cita aqu (o puede traer Nature conservation officer). Puede escribirlo en cualquier hoja de papel. Llmenos antes si su nivel de azcar en sangre es inferior a 89 o si tiene muchas lecturas superiores a 200. Regrese para una cita de seguimiento en 4 meses.

## 2019-12-18 NOTE — Telephone Encounter (Signed)
Received ppw and printed previous disability forms to reference.

## 2019-12-18 NOTE — Telephone Encounter (Signed)
Patient dropped off short term disability paperwork to be completed by provider / placed paper work in [employer] bos at nurses station

## 2019-12-18 NOTE — Progress Notes (Signed)
Subjective:    Patient ID: James Garcia, male    DOB: 06/30/1955, 64 y.o.   MRN: [id number]  HPI Pt returns for f/u of diabetes mellitus:  DM type: Insulin-requiring type 2 Dx'ed: 8756 Complications: PN and DR.   Therapy: insulin since 2016, Ozempic, and metformin.   DKA: never Severe hypoglycemia: never.   Pancreatitis: never.   SDOH: he works 2nd shift. Other: he declines multiple daily injections; He stopped farxiga, due to itching.   Interval history: Pt says he never misses the insulin.  Meter is downloaded today, and the printout is scanned into the record.  Fasting cbg's vary from 80-150.  No recent steroids.   Past Medical History:  Diagnosis Date  . Constipation    uses OTC laxatives - hard stools every morning- small amount and feels like doesnt empty   . Diabetes mellitus without complication ([place])   . DM type 2 (diabetes mellitus, type 2) ([place])   . Hyperlipidemia   . Hypertension   . Leg cramps   . 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 Tuberculosis    pt states was treated for TB    Past Surgical History:  Procedure Laterality Date  . arm surgery    . COLONOSCOPY    . ELBOW SURGERY    . FOOT SURGERY Bilateral   . POLYPECTOMY      Social History   Socioeconomic History  . Marital status: Married    Spouse name: Not on file  . Number of children: 2  . Years of education: Not on file  . Highest education level: Not on file  Occupational History  . Occupation: Engineer, maintenance (IT) at [employer]  . Smoking status: Former Smoker    Packs/day: 0.25    Years: 5.00    Pack years: 1.25    Types: Cigarettes    Quit date: 06/04/1978    Years since quitting: 41.5  . Smokeless tobacco: Never Used  Vaping Use  . Vaping Use: Never used  Substance and Sexual Activity  . Alcohol use: No  . Drug use: No  . Sexual activity: Not on file  Other Topics Concern  . Not on file  Social History Narrative   Lives with family       Social Determinants of Health    Financial Resource Strain:   . Difficulty of Paying Living Expenses: Not on file  Food Insecurity:   . Worried About Charity fundraiser in the Last Year: Not on file  . Ran Out of Food in the Last Year: Not on file  Transportation Needs:   . Lack of Transportation (Medical): Not on file  . Lack of Transportation (Non-Medical): Not on file  Physical Activity:   . Days of Exercise per Week: Not on file  . Minutes of Exercise per Session: Not on file  Stress:   . Feeling of Stress : Not on file  Social Connections:   . Frequency of Communication with Friends and Family: Not on file  . Frequency of Social Gatherings with Friends and Family: Not on file  . Attends Religious Services: Not on file  . Active Member of Clubs or Organizations: Not on file  . Attends Archivist Meetings: Not on file  . Marital Status: Not on file  Intimate Partner Violence:   . Fear of Current or Ex-Partner: Not on file  . Emotionally Abused: Not on file  . Physically Abused: Not on file  .  Sexually Abused: Not on file    Current Outpatient Medications on File Prior to Visit  Medication Sig Dispense Refill  . atorvastatin (LIPITOR) 20 MG tablet TAKE 1 TABLET BY MOUTH ONCE A DAY 90 tablet 0  . Blood Glucose Monitoring Suppl (FREESTYLE LITE) DEVI 1 kit by Does not apply route daily. Use to test blood sugars daily 1 each 0  . Blood Pressure Monitor KIT Use as instructed for HTN 1 each 0  . cetirizine (ZYRTEC) 10 MG tablet Take 10 mg by mouth daily.    Marland Kitchen gabapentin (NEURONTIN) 100 MG capsule TAKE 1 CAPSULE BY MOUTH TWICE A DAY 180 capsule 0  . glucose blood test strip     . Insulin Glargine (BASAGLAR KWIKPEN) 100 UNIT/ML SOPN Inject 0.6 mLs (60 Units total) into the skin every morning. And pen needles 2/day 20 pen 3  . Lancets (FREESTYLE) lancets USE TO CHECK BLOOD SUGAR 2 TIMES PER DAY. 200 each 2  . losartan (COZAAR) 100 MG tablet Take 1 tablet (100 mg total) by mouth daily. 90 tablet 0  .  metFORMIN (GLUCOPHAGE-XR) 500 MG 24 hr tablet TAKE 4 TABLETS BY MOUTH DAILY WITH BREAKFAST. 360 tablet 1  . neomycin-bacitracin-polymyxin (NEOSPORIN) ointment Apply 1 application topically every 12 (twelve) hours. 15 g 0  . pramipexole (MIRAPEX) 0.5 MG tablet Take 1 tablet (0.5 mg total) by mouth 3 (three) times daily. 30 tablet 5  . Semaglutide, 1 MG/DOSE, (OZEMPIC, 1 MG/DOSE,) 2 MG/1.5ML SOPN Inject 1 mg into the skin once a week. 6 pen 3  . sulfamethoxazole-trimethoprim (BACTRIM DS) 800-160 MG tablet Take 1 tablet by mouth 2 (two) times daily. 14 tablet 0  . UNIFINE PENTIPS 31G X 8 MM MISC USE WITH INSULIN TWICE DAILY AS DIRECTED    . fluticasone (FLONASE) 50 MCG/ACT nasal spray Place 1-2 sprays into both nostrils daily for 7 days. 1 g 0  . [DISCONTINUED] albuterol (PROVENTIL HFA;VENTOLIN HFA) 108 (90 Base) MCG/ACT inhaler Inhale 1-2 puffs into the lungs every 4 (four) hours as needed for wheezing or shortness of breath. 1 Inhaler 0   Current Facility-Administered Medications on File Prior to Visit  Medication Dose Route Frequency Provider Last Rate Last Admin  . 0.9 %  sodium chloride infusion  500 mL Intravenous Once Pyrtle, Lajuan Lines, MD        No Known Allergies  Family History  Problem Relation Age of Onset  . Diabetes Father   . Colon cancer Neg Hx   . Colon polyps Neg Hx   . Rectal cancer Neg Hx   . Stomach cancer Neg Hx     BP 124/72   Pulse 72   Ht '6\' 2"'  (1.88 m)   Wt 272 lb 6.4 oz (123.6 kg)   SpO2 98%   BMI 34.97 kg/m    Review of Systems He denies hypoglycemia.      Objective:   Physical Exam VITAL SIGNS:  See vs page GENERAL: no distress Pulses: dorsalis pedis intact bilat.   MSK: no deformity of the feet CV: trace bilat leg edema Skin:  no ulcer on the feet.  normal color and temp on the feet. Neuro: sensation is intact to touch on the feet, but decreased from normal.   Lab Results  Component Value Date   HGBA1C 7.3 (H) 11/20/2019   Lab Results   Component Value Date   CREATININE 0.93 11/26/2019   BUN 17 11/26/2019   NA 140 11/26/2019   K 4.6 11/26/2019  CL 103 11/26/2019   CO2 23 11/26/2019       Assessment & Plan:  Insulin-requiring type 2 DM, with PN: this is the best control this pt should aim for, given this regimen, which does match insulin to his changing needs throughout the day  Patient Instructions  Please continue the same Basaglar, Ozempic, and metformin.     check your blood sugar twice a day.  vary the time of day when you check, between before the 3 meals, and at bedtime.  also check if you have symptoms of your blood sugar being too high or too low.  please keep a record of the readings and bring it to your next appointment here (or you can bring the meter itself).  You can write it on any piece of paper.  please call us sooner if your blood sugar goes below 70, or if you have a lot of readings over 200. Please come back for a follow-up appointment in 4 months.     Contine con el mismo Basaglar, Ozempic y metformin. controle su nivel de azcar en [place]. Vare la hora del da en que lo comprueba, entre antes de las 3 comidas y antes de [place]. Compruebe tambin si tiene sntomas de que su nivel de Location manager en sangre es demasiado alto o demasiado bajo. por favor mantenga un registro de las lecturas y Air cabin crew a su prxima cita aqu (o puede traer Nature conservation officer). Puede escribirlo en cualquier hoja de papel. Llmenos antes si su nivel de azcar en sangre es inferior a 71 o si tiene muchas lecturas superiores a 200. Regrese para una cita de seguimiento en 4 meses.

## 2019-12-20 ENCOUNTER — Ambulatory Visit: Payer: 59 | Admitting: Emergency Medicine

## 2019-12-20 ENCOUNTER — Other Ambulatory Visit: Payer: Self-pay | Admitting: Emergency Medicine

## 2019-12-20 DIAGNOSIS — I10 Essential (primary) hypertension: Secondary | ICD-10-CM

## 2019-12-20 DIAGNOSIS — E785 Hyperlipidemia, unspecified: Secondary | ICD-10-CM

## 2019-12-20 MED FILL — OMEPRAZOLE DR 20 MG CAPSULE: 20 | 90 days supply | Qty: 90 | Fill #0

## 2019-12-20 MED FILL — AMLODIPINE BESYLATE 5 MG TA: 5 | 90 days supply | Qty: 90 | Fill #0

## 2019-12-20 NOTE — Telephone Encounter (Signed)
Requested medication (s) are due for refill today:yes  Requested medication (s) are on the active medication list: yes  Last refill:  09/18/19  #90  Daneil Dolin  Future visit scheduled:No Last OV 12/04/19  Notes to clinic:  Needs lipid panel last done 12/06/18    Requested Prescriptions  Pending Prescriptions Disp Refills   atorvastatin (LIPITOR) 20 MG tablet [Pharmacy Med Name: ATORVASTATIN 20 MG TABLET 20 Tablet] 90 tablet 0    Sig: TAKE 1 TABLET BY MOUTH ONCE A DAY      Cardiovascular:  Antilipid - Statins Failed - 12/20/2019  7:23 AM      Failed - Total Cholesterol in normal range and within 360 days    Cholesterol, Total  Date Value Ref Range Status  12/06/2018 153 100 - 199 mg/dL Final          Failed - LDL in normal range and within 360 days    LDL Chol Calc (NIH)  Date Value Ref Range Status  12/06/2018 88 0 - 99 mg/dL Final          Failed - HDL in normal range and within 360 days    HDL  Date Value Ref Range Status  12/06/2018 51 >39 mg/dL Final          Failed - Triglycerides in normal range and within 360 days    Triglycerides  Date Value Ref Range Status  12/06/2018 73 0 - 149 mg/dL Final          Passed - Patient is not pregnant      Passed - Valid encounter within last 12 months    Recent Outpatient Visits           2 weeks ago Right flank pain   [facility], Ines [place]er, MD   3 weeks ago Right abducens nerve palsy   Primary Care at [place], [place], MD   9 months ago Hypertension associated with type 2 diabetes mellitus [facility])   Primary Care at [place], Ines Bloomer, MD   1 year ago Hypertension associated with type 2 diabetes mellitus [facility])   Primary Care at [facility], Ines [place]er, MD   1 year ago Cough   [facility], Ranell Patrick, MD               Signed Prescriptions Disp Refills   omeprazole (PRILOSEC) 20 MG capsule 90 capsule 0    Sig: TAKE 1 CAPSULE (20 MG TOTAL) BY MOUTH  EVERY MORNING.      Gastroenterology: Proton Pump Inhibitors Passed - 12/20/2019  7:23 AM      Passed - Valid encounter within last 12 months    Recent Outpatient Visits           2 weeks ago Right flank pain   [facility] at [facility], Ines Bloomer, MD   3 weeks ago Right abducens nerve palsy   Primary Care at [facility], [place], MD   9 months ago Hypertension associated with type 2 diabetes mellitus [facility])   Primary Care at [facility], [place], MD   1 year ago Hypertension associated with type 2 diabetes mellitus [facility])   [facility] at [facility], Ines Bloomer, MD   1 year ago Cough   [facility], Ranell Patrick, MD                amLODipine (NORVASC) 5 MG tablet 90 tablet 0  Sig: TAKE 1 TABLET BY MOUTH ONCE A DAY      Cardiovascular:  Calcium Channel Blockers Passed - 12/20/2019  7:23 AM      Passed - Last BP in normal range    BP Readings from Last 1 Encounters:  12/18/19 124/72          Passed - Valid encounter within last 6 months    Recent Outpatient Visits           2 weeks ago Right flank pain   [facility] at [place], Ines Bloomer, MD   3 weeks ago Right abducens nerve palsy   Primary Care at [place], [place], MD   9 months ago Hypertension associated with type 2 diabetes mellitus [facility])   Primary Care at [place], [place], MD   1 year ago Hypertension associated with type 2 diabetes mellitus [facility])   Primary Care at [facility], Ines Bloomer, MD   1 year ago Cough   [facility], Ranell Patrick, MD

## 2019-12-20 NOTE — Telephone Encounter (Signed)
Requested Prescriptions  Pending Prescriptions Disp Refills  . omeprazole (PRILOSEC) 20 MG capsule [Pharmacy Med Name: OMEPRAZOLE 20 MG CAPSULE DR 20 Capsule] 90 capsule 0    Sig: TAKE 1 CAPSULE (20 MG TOTAL) BY MOUTH EVERY MORNING.     Gastroenterology: Proton Pump Inhibitors Passed - 12/20/2019  7:23 AM      Passed - Valid encounter within last 12 months    Recent Outpatient Visits          2 weeks ago Right flank pain   [facility] at [facility], [place], MD   3 weeks ago Right abducens nerve palsy   [facility] at [facility], Ines Bloomer, MD   9 months ago Hypertension associated with type 2 diabetes mellitus [facility])   [facility] at [facility], Ines Bloomer, MD   1 year ago Hypertension associated with type 2 diabetes mellitus [facility])   [facility] at [facility], Ines Bloomer, MD   1 year ago Cough   [facility], Ranell Patrick, MD             . amLODipine ([place]) 5 MG tablet [Pharmacy Med Name: AMLODIPINE BESYLATE 5 MG TA 5 Tablet] 90 tablet 0    Sig: TAKE 1 TABLET BY MOUTH ONCE A DAY     Cardiovascular:  Calcium Channel Blockers Passed - 12/20/2019  7:23 AM      Passed - Last BP in normal range    BP Readings from Last 1 Encounters:  12/18/19 124/72         Passed - Valid encounter within last 6 months    Recent Outpatient Visits          2 weeks ago Right flank pain   Primary Care at [place], Ines Bloomer, MD   3 weeks ago Right abducens nerve palsy   Primary Care at [place], [place], MD   9 months ago Hypertension associated with type 2 diabetes mellitus [facility])   Primary Care at [place], Ines Bloomer, MD   1 year ago Hypertension associated with type 2 diabetes mellitus [facility])   Primary Care at [place], Ines Bloomer, MD   1 year ago Cough   [facility], Ranell Patrick, MD             . atorvastatin (LIPITOR) 20 MG tablet [Pharmacy Med Name: ATORVASTATIN 20 MG TABLET 20  Tablet] 90 tablet 0    Sig: TAKE 1 TABLET BY MOUTH ONCE A DAY     Cardiovascular:  Antilipid - Statins Failed - 12/20/2019  7:23 AM      Failed - Total Cholesterol in normal range and within 360 days    Cholesterol, Total  Date Value Ref Range Status  12/06/2018 153 100 - 199 mg/dL Final         Failed - LDL in normal range and within 360 days    LDL Chol Calc (NIH)  Date Value Ref Range Status  12/06/2018 88 0 - 99 mg/dL Final         Failed - HDL in normal range and within 360 days    HDL  Date Value Ref Range Status  12/06/2018 51 >39 mg/dL Final         Failed - Triglycerides in normal range and within 360 days    Triglycerides  Date Value Ref Range Status  12/06/2018 73 0 - 149 mg/dL Final  Passed - Patient is not pregnant      Passed - Valid encounter within last 12 months    Recent Outpatient Visits          2 weeks ago Right flank pain   Primary Care at [facility], Ines Bloomer, MD   3 weeks ago Right abducens nerve palsy   Primary Care at [facility], [place], MD   9 months ago Hypertension associated with type 2 diabetes mellitus [facility])   Primary Care at [place], [place], MD   1 year ago Hypertension associated with type 2 diabetes mellitus [facility])   [facility], Ines Bloomer, MD   1 year ago Cough   [facility] at [facility], Ranell Patrick, MD

## 2019-12-21 MED FILL — UNIFINE PENTIPS 8MM 31G: 31G X 8 MM | 50 days supply | Qty: 100 | Fill #2

## 2019-12-24 ENCOUNTER — Telehealth: Payer: Self-pay | Admitting: *Deleted

## 2019-12-24 ENCOUNTER — Other Ambulatory Visit: Payer: Self-pay | Admitting: Emergency Medicine

## 2019-12-24 DIAGNOSIS — E785 Hyperlipidemia, unspecified: Secondary | ICD-10-CM

## 2019-12-24 NOTE — Telephone Encounter (Signed)
Spoke to patient the Disability forms were faxed and a copy is ready for pick up with confirmation page attached.

## 2019-12-27 ENCOUNTER — Other Ambulatory Visit: Payer: Self-pay | Admitting: Emergency Medicine

## 2019-12-27 ENCOUNTER — Other Ambulatory Visit: Payer: Self-pay | Admitting: Endocrinology

## 2019-12-27 DIAGNOSIS — E785 Hyperlipidemia, unspecified: Secondary | ICD-10-CM

## 2019-12-27 MED FILL — ATORVASTATIN 20 MG TABLET: 20 | 90 days supply | Qty: 90 | Fill #0

## 2019-12-27 MED FILL — FREESTYLE LITE TEST STRIP: 25 days supply | Qty: 50 | Fill #1

## 2019-12-27 MED FILL — BASAGLAR 100 UNIT/ML KWIKPE: 100 | 90 days supply | Qty: 54 | Fill #7

## 2019-12-27 NOTE — Telephone Encounter (Signed)
Requested medication (s) are due for refill today: yes  Requested medication (s) are on the active medication list: yes  Last refill: 09/18/19   Future visit scheduled: No  Notes to clinic:  failed protocol labs are over due. Note that this has been refused twice stating already responded by other means. Pharmacy note they have never received.    Requested Prescriptions  Pending Prescriptions Disp Refills   atorvastatin (LIPITOR) 20 MG tablet [Pharmacy Med Name: ATORVASTATIN 20 MG TABLET 20 Tablet] 90 tablet 0    Sig: TAKE 1 TABLET BY MOUTH ONCE A DAY      Cardiovascular:  Antilipid - Statins Failed - 12/27/2019 10:25 AM      Failed - Total Cholesterol in normal range and within 360 days    Cholesterol, Total  Date Value Ref Range Status  12/06/2018 153 100 - 199 mg/dL Final          Failed - LDL in normal range and within 360 days    LDL Chol Calc (NIH)  Date Value Ref Range Status  12/06/2018 88 0 - 99 mg/dL Final          Failed - HDL in normal range and within 360 days    HDL  Date Value Ref Range Status  12/06/2018 51 >39 mg/dL Final          Failed - Triglycerides in normal range and within 360 days    Triglycerides  Date Value Ref Range Status  12/06/2018 73 0 - 149 mg/dL Final          Passed - Patient is not pregnant      Passed - Valid encounter within last 12 months    Recent Outpatient Visits           3 weeks ago Right flank pain   Primary Care at [place], Ines Bloomer, MD   1 month ago Right abducens nerve palsy   Primary Care at [place], [place], MD   9 months ago Hypertension associated with type 2 diabetes mellitus [facility])   Primary Care at [place], [place], MD   1 year ago Hypertension associated with type 2 diabetes mellitus [facility])   Primary Care at [facility], Ines Bloomer, MD   2 years ago Cough   [facility] at [facility], Ranell Patrick, MD

## 2020-01-17 ENCOUNTER — Other Ambulatory Visit: Payer: Self-pay | Admitting: Endocrinology

## 2020-01-17 DIAGNOSIS — H4921 Sixth [abducent] nerve palsy, right eye: Secondary | ICD-10-CM | POA: Diagnosis not present

## 2020-01-17 MED FILL — OZEMPIC (1 MG/DOSE) 4 MG/3M: 4 | 84 days supply | Qty: 9 | Fill #0

## 2020-01-18 ENCOUNTER — Other Ambulatory Visit (HOSPITAL_COMMUNITY): Payer: Self-pay | Admitting: Endocrinology

## 2020-01-18 ENCOUNTER — Other Ambulatory Visit: Payer: Self-pay

## 2020-01-18 MED ORDER — OZEMPIC (1 MG/DOSE) 2 MG/1.5ML ~~LOC~~ SOPN: 1.0000 mg | PEN_INJECTOR | SUBCUTANEOUS | 3 refills | Status: DC

## 2020-02-11 ENCOUNTER — Ambulatory Visit: Payer: 59 | Admitting: Neurology

## 2020-02-15 ENCOUNTER — Other Ambulatory Visit: Payer: Self-pay | Admitting: Emergency Medicine

## 2020-02-15 DIAGNOSIS — G6289 Other specified polyneuropathies: Secondary | ICD-10-CM

## 2020-02-15 MED FILL — GABAPENTIN 100 MG CAPSULE: 100 | 90 days supply | Qty: 180 | Fill #0

## 2020-02-18 ENCOUNTER — Other Ambulatory Visit: Payer: Self-pay | Admitting: Endocrinology

## 2020-02-18 MED FILL — FREESTYLE LITE TEST STRIP: 25 days supply | Qty: 50 | Fill #0

## 2020-02-29 DIAGNOSIS — H524 Presbyopia: Secondary | ICD-10-CM | POA: Diagnosis not present

## 2020-02-29 DIAGNOSIS — H4921 Sixth [abducent] nerve palsy, right eye: Secondary | ICD-10-CM | POA: Diagnosis not present

## 2020-03-10 MED FILL — FREESTYLE LITE TEST STRIP: 25 days supply | Qty: 50 | Fill #0

## 2020-03-10 MED FILL — METFORMIN HCL ER 500 MG TB2: 500 | 90 days supply | Qty: 360 | Fill #1

## 2020-03-27 ENCOUNTER — Other Ambulatory Visit: Payer: Self-pay | Admitting: Emergency Medicine

## 2020-03-27 ENCOUNTER — Telehealth: Payer: Self-pay | Admitting: Endocrinology

## 2020-03-27 DIAGNOSIS — E785 Hyperlipidemia, unspecified: Secondary | ICD-10-CM

## 2020-03-27 DIAGNOSIS — I10 Essential (primary) hypertension: Secondary | ICD-10-CM

## 2020-03-27 MED FILL — AMLODIPINE BESYLATE 5 MG TA: 5 | 90 days supply | Qty: 90 | Fill #0

## 2020-03-27 MED FILL — OZEMPIC (1 MG/DOSE) 4 MG/3M: 4 | 84 days supply | Qty: 9 | Fill #0

## 2020-03-27 NOTE — Telephone Encounter (Signed)
Requested medication (s) are due for refill today -yes  Requested medication (s) are on the active medication list -yes  Future visit scheduled -no  Last refill: 12/27/19  Notes to clinic: [facility] lab protocol- last lab 2020- sent for review of request  Requested Prescriptions  Pending Prescriptions Disp Refills   atorvastatin (LIPITOR) 20 MG tablet [Pharmacy Med Name: ATORVASTATIN 20 MG TABLET 20 Tablet] 90 tablet 0    Sig: TAKE 1 TABLET BY MOUTH ONCE A DAY      Cardiovascular:  Antilipid - Statins Failed - 03/27/2020 11:29 AM      Failed - Total Cholesterol in normal range and within 360 days    Cholesterol, Total  Date Value Ref Range Status  12/06/2018 153 100 - 199 mg/dL Final          Failed - LDL in normal range and within 360 days    LDL Chol Calc (NIH)  Date Value Ref Range Status  12/06/2018 88 0 - 99 mg/dL Final          Failed - HDL in normal range and within 360 days    HDL  Date Value Ref Range Status  12/06/2018 51 >39 mg/dL Final          Failed - Triglycerides in normal range and within 360 days    Triglycerides  Date Value Ref Range Status  12/06/2018 73 0 - 149 mg/dL Final          Passed - Patient is not pregnant      Passed - Valid encounter within last 12 months    Recent Outpatient Visits           3 months ago Right flank pain   [facility], Ines Bloomer, MD   4 months ago Right abducens nerve palsy   Primary Care at [place], Ines Bloomer, MD   1 year ago Hypertension associated with type 2 diabetes mellitus [facility])   [facility] at [facility], [place], MD   1 year ago Hypertension associated with type 2 diabetes mellitus [facility])   [facility] at [facility], Ines Bloomer, MD   2 years ago Cough   [facility] at [facility], Ranell Patrick, MD                 Signed Prescriptions Disp Refills   amLODipine (NORVASC) 5 MG tablet 90 tablet 0    Sig: TAKE 1 TABLET BY MOUTH ONCE A DAY       Cardiovascular:  Calcium Channel Blockers Passed - 03/27/2020 11:29 AM      Passed - Last BP in normal range    BP Readings from Last 1 Encounters:  12/18/19 124/72          Passed - Valid encounter within last 6 months    Recent Outpatient Visits           3 months ago Right flank pain   Primary Care at [place], Ines Bloomer, MD   4 months ago Right abducens nerve palsy   Primary Care at [place], [place], MD   1 year ago Hypertension associated with type 2 diabetes mellitus [facility])   Primary Care at [place], [place], MD   1 year ago Hypertension associated with type 2 diabetes mellitus [facility])   [facility] at [facility], Ines Bloomer, MD   2 years ago Cough   [facility] at [facility], Ranell Patrick, MD  Requested Prescriptions  Pending Prescriptions Disp Refills   atorvastatin (LIPITOR) 20 MG tablet [Pharmacy Med Name: ATORVASTATIN 20 MG TABLET 20 Tablet] 90 tablet 0    Sig: TAKE 1 TABLET BY MOUTH ONCE A DAY      Cardiovascular:  Antilipid - Statins Failed - 03/27/2020 11:29 AM      Failed - Total Cholesterol in normal range and within 360 days    Cholesterol, Total  Date Value Ref Range Status  12/06/2018 153 100 - 199 mg/dL Final          Failed - LDL in normal range and within 360 days    LDL Chol Calc (NIH)  Date Value Ref Range Status  12/06/2018 88 0 - 99 mg/dL Final          Failed - HDL in normal range and within 360 days    HDL  Date Value Ref Range Status  12/06/2018 51 >39 mg/dL Final          Failed - Triglycerides in normal range and within 360 days    Triglycerides  Date Value Ref Range Status  12/06/2018 73 0 - 149 mg/dL Final          Passed - Patient is not pregnant      Passed - Valid encounter within last 12 months    Recent Outpatient Visits           3 months ago Right flank pain   Primary Care at [facility], Ines Bloomer, MD   4 months ago Right abducens nerve  palsy   Primary Care at [place], [place], MD   1 year ago Hypertension associated with type 2 diabetes mellitus [facility])   [facility] at [facility], [place], MD   1 year ago Hypertension associated with type 2 diabetes mellitus [facility])   Primary Care at [facility], Ines Bloomer, MD   2 years ago Cough   [facility] at [facility], Ranell Patrick, MD                 Signed Prescriptions Disp Refills   amLODipine (NORVASC) 5 MG tablet 90 tablet 0    Sig: TAKE 1 TABLET BY MOUTH ONCE A DAY      Cardiovascular:  Calcium Channel Blockers Passed - 03/27/2020 11:29 AM      Passed - Last BP in normal range    BP Readings from Last 1 Encounters:  12/18/19 124/72          Passed - Valid encounter within last 6 months    Recent Outpatient Visits           3 months ago Right flank pain   Primary Care at [place], Ines Bloomer, MD   4 months ago Right abducens nerve palsy   Primary Care at [place], [place], MD   1 year ago Hypertension associated with type 2 diabetes mellitus [facility])   Primary Care at [place], [place], MD   1 year ago Hypertension associated with type 2 diabetes mellitus [facility])   [facility] at [facility], Ines Bloomer, MD   2 years ago Cough   [facility], Ranell Patrick, MD

## 2020-03-27 NOTE — Telephone Encounter (Signed)
Pharmacy just called wanted to verify that Ozempic needed to be sent in as a 3 month supply? Please advise   Direct line for pharmacy - [phone]

## 2020-03-27 NOTE — Telephone Encounter (Signed)
Spoke w/ pharmacy- informed okay for 1 month w/ 3 refils.

## 2020-04-01 ENCOUNTER — Other Ambulatory Visit: Payer: Self-pay | Admitting: Emergency Medicine

## 2020-04-01 ENCOUNTER — Other Ambulatory Visit: Payer: Self-pay

## 2020-04-01 DIAGNOSIS — E785 Hyperlipidemia, unspecified: Secondary | ICD-10-CM

## 2020-04-01 MED FILL — ATORVASTATIN CALCIUM 20 MG: 20 | 90 days supply | Qty: 90 | Fill #0

## 2020-04-01 MED FILL — UNIFINE PENTIPS 8MM 31G: 31G X 8 MM | 50 days supply | Qty: 100 | Fill #3

## 2020-04-01 NOTE — Telephone Encounter (Signed)
Pharamcy is requesting faxed approval for medication- sent for review of request.

## 2020-04-02 ENCOUNTER — Telehealth: Payer: Self-pay | Admitting: *Deleted

## 2020-04-02 NOTE — Telephone Encounter (Signed)
Always fine with me

## 2020-04-02 NOTE — Telephone Encounter (Signed)
[facility] --called stating pt Rx Basaglar with cost increase--and alternatives bio similar--Semglee with coupon--zero cost. please advise

## 2020-04-03 MED FILL — SEMGLEE (YFGN) 100 UNIT/ML: 100 | 90 days supply | Qty: 54 | Fill #0

## 2020-04-04 NOTE — Telephone Encounter (Signed)
[place] pharmacy--stated already process this medication.

## 2020-04-14 ENCOUNTER — Encounter: Payer: Self-pay | Admitting: Emergency Medicine

## 2020-04-14 ENCOUNTER — Telehealth (INDEPENDENT_AMBULATORY_CARE_PROVIDER_SITE_OTHER): Payer: 59 | Admitting: Emergency Medicine

## 2020-04-14 ENCOUNTER — Other Ambulatory Visit: Payer: Self-pay

## 2020-04-14 ENCOUNTER — Telehealth: Payer: Self-pay | Admitting: *Deleted

## 2020-04-14 VITALS — Ht 73.0 in | Wt 280.0 lb

## 2020-04-14 DIAGNOSIS — U071 COVID-19: Secondary | ICD-10-CM | POA: Diagnosis not present

## 2020-04-14 NOTE — Progress Notes (Signed)
Telemedicine Encounter- SOAP NOTE Established Patient Patient: Home  Provider: Office     This telephone encounter was conducted with the patient's (or proxy's) verbal consent via audio telecommunications: yes/no: Yes Patient was instructed to have this encounter in a suitably private space; and to only have persons present to whom they give permission to participate. In addition, patient identity was confirmed by use of name plus two identifiers (DOB and address).  I discussed the limitations, risks, security and privacy concerns of performing an evaluation and management service by telephone and the availability of in person appointments. I also discussed with the patient that there may be a patient responsible charge related to this service. The patient expressed understanding and agreed to proceed.  I spent a total of TIME; 0 MIN TO 60 MIN: 20 minutes talking with the patient or their proxy.  Chief Complaint  Patient presents with  . Cough    Nonproductive, per patient the first symptoms started on 03/22/2020 Saturday. Tested positive for Covid 03/24/2020 at [facility]  . Fatigue    Per patient he had the [place]  . Nasal Congestion    Subjective   James Garcia is a 65 y.o. male established patient. Telephone visit today for follow-up of Covid infection.  Day 1 was 03/22/2020.  Feeling much better today.  However still having some runny nose, general weakness and nonproductive occasional cough.  Scheduled to return to work on 04/22/2020.  Needs return to work note.  HPI   Patient Active Problem List   Diagnosis Date Noted  . Diplopia 11/28/2019  . Restless leg 11/28/2019  . Right abducens nerve palsy 11/27/2019  . Left leg pain 09/24/2019  . Diabetes ([place]) 05/01/2015    Past Medical History:  Diagnosis Date  . Constipation    uses OTC laxatives - hard stools every morning- small amount and feels like doesnt empty   . Diabetes mellitus without complication ([place])   .  DM type 2 (diabetes mellitus, type 2) ([place])   . Hyperlipidemia   . Hypertension   . Leg cramps   . 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 Tuberculosis    pt states was treated for TB    Current Outpatient Medications  Medication Sig Dispense Refill  . amLODipine (NORVASC) 5 MG tablet TAKE 1 TABLET BY MOUTH ONCE A DAY 90 tablet 0  . atorvastatin (LIPITOR) 20 MG tablet TAKE 1 TABLET BY MOUTH ONCE A DAY 90 tablet 0  . gabapentin (NEURONTIN) 100 MG capsule TAKE 1 CAPSULE BY MOUTH TWICE A DAY 180 capsule 0  . Insulin Glargine (BASAGLAR KWIKPEN) 100 UNIT/ML SOPN Inject 0.6 mLs (60 Units total) into the skin every morning. And pen needles 2/day 20 pen 3  . losartan (COZAAR) 100 MG tablet Take 1 tablet (100 mg total) by mouth daily. 90 tablet 0  . metFORMIN (GLUCOPHAGE-XR) 500 MG 24 hr tablet TAKE 4 TABLETS BY MOUTH DAILY WITH BREAKFAST. 360 tablet 1  . neomycin-bacitracin-polymyxin (NEOSPORIN) ointment Apply 1 application topically every 12 (twelve) hours. 15 g 0  . Semaglutide, 1 MG/DOSE, (OZEMPIC, 1 MG/DOSE,) 2 MG/1.5ML SOPN Inject 1 mg into the skin once a week. 4.5 mL 3  . Blood Glucose Monitoring Suppl (FREESTYLE LITE) DEVI 1 kit by Does not apply route daily. Use to test blood sugars daily 1 each 0  . Blood Pressure Monitor KIT Use as instructed for HTN 1 each 0  . cetirizine (ZYRTEC) 10 MG tablet Take 10  mg by mouth daily. (Patient not taking: Reported on 04/14/2020)    . fluticasone (FLONASE) 50 MCG/ACT nasal spray Place 1-2 sprays into both nostrils daily for 7 days. 1 g 0  . FREESTYLE LITE test strip USE TO CHECK BLOOD SUGAR TWICE A DAY 50 strip 0  . Lancets (FREESTYLE) lancets USE TO CHECK BLOOD SUGAR 2 TIMES PER DAY. 200 each 2  . omeprazole (PRILOSEC) 20 MG capsule TAKE 1 CAPSULE (20 MG TOTAL) BY MOUTH EVERY MORNING. (Patient not taking: Reported on 04/14/2020) 90 capsule 0  . pramipexole (MIRAPEX) 0.5 MG tablet Take 1 tablet (0.5 mg total) by mouth 3 (three) times daily. (Patient not  taking: Reported on 04/14/2020) 30 tablet 5  . sulfamethoxazole-trimethoprim (BACTRIM DS) 800-160 MG tablet Take 1 tablet by mouth 2 (two) times daily. (Patient not taking: Reported on 04/14/2020) 14 tablet 0  . UNIFINE PENTIPS 31G X 8 MM MISC USE WITH INSULIN TWICE DAILY AS DIRECTED     Current Facility-Administered Medications  Medication Dose Route Frequency Provider Last Rate Last Admin  . 0.9 %  sodium chloride infusion  500 mL Intravenous Once Pyrtle, Lajuan Lines, MD        No Known Allergies  Social History   Socioeconomic History  . Marital status: Married    Spouse name: Not on file  . Number of children: 2  . Years of education: Not on file  . Highest education level: Not on file  Occupational History  . Occupation: Engineer, maintenance (IT) at [employer]  . Smoking status: Former Smoker    Packs/day: 0.25    Years: 5.00    Pack years: 1.25    Types: Cigarettes    Quit date: 06/04/1978    Years since quitting: 41.8  . Smokeless tobacco: Never Used  Vaping Use  . Vaping Use: Never used  Substance and Sexual Activity  . Alcohol use: No  . Drug use: No  . Sexual activity: Not on file  Other Topics Concern  . Not on file  Social History Narrative   Lives with family       Social Determinants of Health   Financial Resource Strain: Not on file  Food Insecurity: Not on file  Transportation Needs: Not on file  Physical Activity: Not on file  Stress: Not on file  Social Connections: Not on file  Intimate Partner Violence: Not on file    Review of Systems  Constitutional: Negative.  Negative for chills and fever.  HENT: Positive for congestion. Negative for sore throat.   Respiratory: Positive for cough. Negative for shortness of breath.   Cardiovascular: Negative.  Negative for chest pain and palpitations.  Gastrointestinal: Negative for abdominal pain, diarrhea, nausea and vomiting.  Genitourinary: Negative.   Skin: Negative.  Negative for rash.  Neurological: Negative  for dizziness and headaches.  All other systems reviewed and are negative.   Objective  Alert and oriented x3 no apparent respiratory distress Vitals as reported by the patient: Today's Vitals   04/14/20 1555  Weight: 280 lb (127 kg)  Height: '6\' 1"'  (1.854 m)    There are no diagnoses linked to this encounter. Clinically stable.  Much improved.  No medical concerns identified during this visit. Yonathan was seen today for cough, fatigue and nasal congestion.  Diagnoses and all orders for this visit:  COVID-19 virus infection     I discussed the assessment and treatment plan with the patient. The patient was provided an opportunity to ask  questions and all were answered. The patient agreed with the plan and demonstrated an understanding of the instructions.   The patient was advised to call back or seek an in-person evaluation if the symptoms worsen or if the condition fails to improve as anticipated.  I provided 20 minutes of non-face-to-face time during this encounter.  Horald Pollen, MD  Primary Care at [facility]

## 2020-04-14 NOTE — Patient Instructions (Signed)
° ° ° °  If you have lab work done today you will be contacted with your lab results within the next 2 weeks.  If you have not heard from us then please contact us. The fastest way to get your results is to register for My Chart. ° ° °IF you received an x-ray today, you will receive an invoice from [facility]. Please contact [facility] at [phone] with questions or concerns regarding your invoice.  ° °IF you received labwork today, you will receive an invoice from [facility]. Please contact [facility] at [phone] with questions or concerns regarding your invoice.  ° °Our billing staff will not be able to assist you with questions regarding bills from these companies. ° °You will be contacted with the lab results as soon as they are available. The fastest way to get your results is to activate your My Chart account. Instructions are located on the last page of this paperwork. If you have not heard from us regarding the results in 2 weeks, please contact this office. °  ° ° ° °

## 2020-04-14 NOTE — Telephone Encounter (Signed)
Patient had a telemedicine visit today and Dr Mitchel Honour printed a out of work note due patient diagnosed with Covid 79. Patient does not have MyChart, he will call tomorrow with fax number for his job.

## 2020-04-21 ENCOUNTER — Ambulatory Visit (INDEPENDENT_AMBULATORY_CARE_PROVIDER_SITE_OTHER): Payer: 59 | Admitting: Endocrinology

## 2020-04-21 ENCOUNTER — Other Ambulatory Visit: Payer: Self-pay

## 2020-04-21 ENCOUNTER — Telehealth: Payer: Self-pay | Admitting: *Deleted

## 2020-04-21 ENCOUNTER — Other Ambulatory Visit: Payer: Self-pay | Admitting: Endocrinology

## 2020-04-21 VITALS — BP 150/84 | HR 69 | Ht 74.0 in | Wt 272.8 lb

## 2020-04-21 DIAGNOSIS — Z794 Long term (current) use of insulin: Secondary | ICD-10-CM | POA: Diagnosis not present

## 2020-04-21 DIAGNOSIS — E1141 Type 2 diabetes mellitus with diabetic mononeuropathy: Secondary | ICD-10-CM

## 2020-04-21 DIAGNOSIS — I10 Essential (primary) hypertension: Secondary | ICD-10-CM | POA: Diagnosis not present

## 2020-04-21 LAB — POCT GLYCOSYLATED HEMOGLOBIN (HGB A1C): Hemoglobin A1C: 7.6 % — AB (ref 4.0–5.6)

## 2020-04-21 MED ORDER — BASAGLAR KWIKPEN 100 UNIT/ML ~~LOC~~ SOPN
62.0000 [IU] | PEN_INJECTOR | SUBCUTANEOUS | 3 refills | Status: DC
Start: 2020-04-21 — End: 2020-04-21

## 2020-04-21 NOTE — Patient Instructions (Addendum)
Your blood pressure is high today.  Please see your primary care provider soon, to have it rechecked.   Please continue the same Basaglar, Ozempic, and metformin.   check your blood sugar twice a day.  vary the time of day when you check, between before the 3 meals, and at bedtime.  also check if you have symptoms of your blood sugar being too high or too low.  please keep a record of the readings and bring it to your next appointment here (or you can bring the meter itself).  You can write it on any piece of paper.  please call us sooner if your blood sugar goes below 70, or if you have a lot of readings over 200.   Please come back for a follow-up appointment in 4 months.

## 2020-04-21 NOTE — Telephone Encounter (Signed)
Spoke to patient to get the fax number to sent the out of work note. The number given was [phone] Attn: Tandy Gaw. Confirmation 11:30 am.

## 2020-04-21 NOTE — Progress Notes (Signed)
 Subjective:    Patient ID: James Garcia, male    DOB: 06/23/1955, 65 y.o.   MRN: [id number]  HPI Pt returns for f/u of diabetes mellitus:  DM type: Insulin-requiring type 2 Dx'ed: 2007 Complications: PN and DR.   Therapy: insulin since 2016, Ozempic, and metformin.   DKA: never Severe hypoglycemia: never.   Pancreatitis: never.   SDOH: he works 2nd shift. Other: he declines multiple daily injections; He stopped farxiga, due to itching.   Interval history: Pt says he never misses the insulin. no cbg record, but states cbg's vary from 134-191.  Almost all are checked fasting.  However, pt says prior to the time frame scanned, cbg has been as low as 86.  No recent steroids.  Past Medical History:  Diagnosis Date  . Constipation    uses OTC laxatives - hard stools every morning- small amount and feels like doesnt empty   . Diabetes mellitus without complication ([facility])   . DM type 2 (diabetes mellitus, type 2) ([facility])   . Hyperlipidemia   . Hypertension   . Leg cramps   . 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 Tuberculosis    pt states was treated for TB    Past Surgical History:  Procedure Laterality Date  . arm surgery    . COLONOSCOPY    . ELBOW SURGERY    . FOOT SURGERY Bilateral   . POLYPECTOMY      Social History   Socioeconomic History  . Marital status: Married    Spouse name: Not on file  . Number of children: 2  . Years of education: Not on file  . Highest education level: Not on file  Occupational History  . Occupation: Floor Tech at [employer]   Tobacco Use  . Smoking status: Former Smoker    Packs/day: 0.25    Years: 5.00    Pack years: 1.25    Types: Cigarettes    Quit date: 06/04/1978    Years since quitting: 41.9  . Smokeless tobacco: Never Used  Vaping Use  . Vaping Use: Never used  Substance and Sexual Activity  . Alcohol use: No  . Drug use: No  . Sexual activity: Not on file  Other Topics Concern  . Not on file  Social History Narrative    Lives with family       Social Determinants of Health   Financial Resource Strain: Not on file  Food Insecurity: Not on file  Transportation Needs: Not on file  Physical Activity: Not on file  Stress: Not on file  Social Connections: Not on file  Intimate Partner Violence: Not on file    Current Outpatient Medications on File Prior to Visit  Medication Sig Dispense Refill  . amLODipine (NORVASC) 5 MG tablet TAKE 1 TABLET BY MOUTH ONCE A DAY 90 tablet 0  . atorvastatin (LIPITOR) 20 MG tablet TAKE 1 TABLET BY MOUTH ONCE A DAY 90 tablet 0  . Blood Glucose Monitoring Suppl (FREESTYLE LITE) DEVI 1 kit by Does not apply route daily. Use to test blood sugars daily 1 each 0  . Blood Pressure Monitor KIT Use as instructed for HTN 1 each 0  . cetirizine (ZYRTEC) 10 MG tablet Take 10 mg by mouth daily.    . FREESTYLE LITE test strip USE TO CHECK BLOOD SUGAR TWICE A DAY 50 strip 0  . gabapentin (NEURONTIN) 100 MG capsule TAKE 1 CAPSULE BY MOUTH TWICE A DAY 180 capsule 0  .   Lancets (FREESTYLE) lancets USE TO CHECK BLOOD SUGAR 2 TIMES PER DAY. 200 each 2  . losartan (COZAAR) 100 MG tablet Take 1 tablet (100 mg total) by mouth daily. 90 tablet 0  . metFORMIN (GLUCOPHAGE-XR) 500 MG 24 hr tablet TAKE 4 TABLETS BY MOUTH DAILY WITH BREAKFAST. 360 tablet 1  . neomycin-bacitracin-polymyxin (NEOSPORIN) ointment Apply 1 application topically every 12 (twelve) hours. 15 g 0  . omeprazole (PRILOSEC) 20 MG capsule TAKE 1 CAPSULE (20 MG TOTAL) BY MOUTH EVERY MORNING. 90 capsule 0  . pramipexole (MIRAPEX) 0.5 MG tablet Take 1 tablet (0.5 mg total) by mouth 3 (three) times daily. 30 tablet 5  . Semaglutide, 1 MG/DOSE, (OZEMPIC, 1 MG/DOSE,) 2 MG/1.5ML SOPN Inject 1 mg into the skin once a week. 4.5 mL 3  . sulfamethoxazole-trimethoprim (BACTRIM DS) 800-160 MG tablet Take 1 tablet by mouth 2 (two) times daily. 14 tablet 0  . UNIFINE PENTIPS 31G X 8 MM MISC USE WITH INSULIN TWICE DAILY AS DIRECTED    . fluticasone  (FLONASE) 50 MCG/ACT nasal spray Place 1-2 sprays into both nostrils daily for 7 days. 1 g 0  . [DISCONTINUED] albuterol (PROVENTIL HFA;VENTOLIN HFA) 108 (90 Base) MCG/ACT inhaler Inhale 1-2 puffs into the lungs every 4 (four) hours as needed for wheezing or shortness of breath. 1 Inhaler 0   Current Facility-Administered Medications on File Prior to Visit  Medication Dose Route Frequency Provider Last Rate Last Admin  . 0.9 %  sodium chloride infusion  500 mL Intravenous Once Pyrtle, Jay M, MD        No Known Allergies  Family History  Problem Relation Age of Onset  . Diabetes Father   . Colon cancer Neg Hx   . Colon polyps Neg Hx   . Rectal cancer Neg Hx   . Stomach cancer Neg Hx     BP (!) 150/84 (BP Location: Right Arm, Patient Position: Sitting, Cuff Size: Small)   Pulse 69   Ht 6' 2" (1.88 m)   Wt 272 lb 12.8 oz (123.7 kg)   SpO2 97%   BMI 35.03 kg/m    Review of Systems He denies hypoglycemia.      Objective:   Physical Exam VITAL SIGNS:  See vs page GENERAL: no distress Pulses: dorsalis pedis intact bilat.   MSK: no deformity of the feet CV: trace bilat leg edema Skin:  no ulcer on the feet.  normal color and temp on the feet. Neuro: sensation is intact to touch on the feet, but decreased from normal.   Lab Results  Component Value Date   HGBA1C 7.6 (A) 04/21/2020       Assessment & Plan:  HTN: is noted today. Insulin-requiring type 2 DM, with PN: uncontrolled.  Increase Basaglar to 62 units each morning.   Patient Instructions  Your blood pressure is high today.  Please see your primary care provider soon, to have it rechecked.   Please continue the same Basaglar, Ozempic, and metformin.   check your blood sugar twice a day.  vary the time of day when you check, between before the 3 meals, and at bedtime.  also check if you have symptoms of your blood sugar being too high or too low.  please keep a record of the readings and bring it to your next  appointment here (or you can bring the meter itself).  You can write it on any piece of paper.  please call us sooner if your blood sugar goes below   70, or if you have a lot of readings over 200.   Please come back for a follow-up appointment in 4 months.     

## 2020-04-22 MED FILL — BASAGLAR 100 UNIT/ML KWIKPE: 100 | 87 days supply | Qty: 54 | Fill #0

## 2020-04-24 ENCOUNTER — Telehealth: Payer: Self-pay | Admitting: Emergency Medicine

## 2020-04-24 NOTE — Telephone Encounter (Signed)
Received a fax from [employer] for provider to fill out for pt. Paperwork will be left with Yosseline or in her box. Please advise.

## 2020-04-25 NOTE — Telephone Encounter (Signed)
Scanned to e-mail. Hardcopy in fmla box 

## 2020-04-28 NOTE — Telephone Encounter (Signed)
Called pt and pt wife answered said she would have him call back.   I need to know the first date he was unable to work? What condition he is out because of. Last visit was COVID 19 is this the reason for leave? Does he need continuous leave, intermittent leave or reduced schedule?

## 2020-04-29 ENCOUNTER — Telehealth: Payer: Self-pay | Admitting: Emergency Medicine

## 2020-04-29 NOTE — Telephone Encounter (Signed)
Patient called to follow up on FMLA paperwork. He's having an issue with work release papers that were sent over to his employer and patient won't get paid until paperwork corrected. Patient's employer stated they faxed the paperwork that needs to be corrected/adjusted.  Paperwork needs to be resubmitted to his employer by 2/16 at the latest. Please advise at [phone].

## 2020-04-29 NOTE — Telephone Encounter (Signed)
Not sure if you received these FMLA forms. Please let me know if there is anything I would need to do

## 2020-05-01 NOTE — Telephone Encounter (Signed)
The fax with FMLA had another patients information and no forms for him included had to send new fax requesting we be sent full forms I do not have these at this time I will look into them today.   Was out of office 04/30/2020 and we require 7-10 business days to submit forms and any corrections.

## 2020-05-16 ENCOUNTER — Other Ambulatory Visit: Payer: Self-pay | Admitting: Emergency Medicine

## 2020-05-16 DIAGNOSIS — G6289 Other specified polyneuropathies: Secondary | ICD-10-CM

## 2020-05-16 MED FILL — GABAPENTIN 100 MG CAPSULE: 100 | 90 days supply | Qty: 180 | Fill #0

## 2020-05-28 ENCOUNTER — Other Ambulatory Visit: Payer: Self-pay | Admitting: Endocrinology

## 2020-05-28 NOTE — Telephone Encounter (Signed)
[place], [place] - [address] [facility]. Phone:  [phone]  Fax:  [phone]     Pt called to request a refill on his test strips

## 2020-05-30 ENCOUNTER — Telehealth: Payer: Self-pay | Admitting: Endocrinology

## 2020-05-30 NOTE — Telephone Encounter (Signed)
Patient called to advise that his is having fasting blood sugars in the 50's.  His end of day blood sugar was 72 after eating.  This morning it was 55.  It not hungry and not taking mediation.  Wants advice on how to proceed with lows, medications, etc.  Call back [phone] (cell) and [phone])

## 2020-05-30 NOTE — Telephone Encounter (Signed)
Please Advise

## 2020-05-31 NOTE — Telephone Encounter (Signed)
Spoke with pt and she stated that she would,like to stay on the pod not the pump.  Please Advise

## 2020-05-31 NOTE — Telephone Encounter (Signed)
Please verify basaglar is 62 units qam Then please reduce to 58 units qam.

## 2020-05-31 NOTE — Telephone Encounter (Signed)
Please verify that this message is for this pt.  TY

## 2020-06-02 NOTE — Telephone Encounter (Signed)
LVM for pt regarding basaglar. Ask that he contact the office to let us know how he is currently taking the basaglar

## 2020-06-05 ENCOUNTER — Other Ambulatory Visit (HOSPITAL_COMMUNITY): Payer: Self-pay | Admitting: Endocrinology

## 2020-06-06 NOTE — Telephone Encounter (Signed)
Pt called back, I asked what he is currently taking and pt states he is taking 58 units of Basaglar in the morning as well as his Metformin, 3 pills.

## 2020-06-06 NOTE — Telephone Encounter (Signed)
Please Advise

## 2020-06-07 NOTE — Telephone Encounter (Signed)
LVM for pt in regards to his insulin medication and ask the he reduce to 56u qam per Loanne Drilling.

## 2020-06-07 NOTE — Telephone Encounter (Signed)
please reduce to 56 units qam.

## 2020-06-09 MED FILL — OZEMPIC (1 MG/DOSE) 4 MG/3M: 4 | 28 days supply | Qty: 3 | Fill #1

## 2020-06-23 ENCOUNTER — Other Ambulatory Visit (HOSPITAL_COMMUNITY): Payer: Self-pay

## 2020-06-23 ENCOUNTER — Telehealth: Payer: Self-pay | Admitting: Endocrinology

## 2020-06-23 ENCOUNTER — Other Ambulatory Visit: Payer: Self-pay

## 2020-06-23 ENCOUNTER — Other Ambulatory Visit: Payer: Self-pay | Admitting: Endocrinology

## 2020-06-23 DIAGNOSIS — Z794 Long term (current) use of insulin: Secondary | ICD-10-CM

## 2020-06-23 MED ORDER — FREESTYLE LITE TEST VI STRP
ORAL_STRIP | 12 refills | Status: DC
  Filled 2020-06-23: qty 100, 50d supply, fill #0

## 2020-06-23 NOTE — Telephone Encounter (Signed)
Rx sent 

## 2020-06-23 NOTE — Telephone Encounter (Signed)
MEDICATION: freestyle libre test strips  PHARMACY:  [facility]  [address] [place] [address]  Phone:  [phone] Fax:  [phone]   HAS THE PATIENT CONTACTED THEIR PHARMACY?  yes  IS THIS A 90 DAY SUPPLY : same as last time  IS PATIENT OUT OF MEDICATION: unsure  IF NOT; HOW MUCH IS LEFT:   LAST APPOINTMENT DATE: @3 /18/2022  NEXT APPOINTMENT DATE:@6 /20/2022  DO WE HAVE YOUR PERMISSION TO LEAVE A DETAILED MESSAGE?: yes  OTHER COMMENTS:    **Let patient know to contact pharmacy at the end of the day to make sure medication is ready. **  ** Please notify patient to allow 48-72 hours to process**  **Encourage patient to contact the pharmacy for refills or they can request refills through [facility]**

## 2020-06-24 ENCOUNTER — Other Ambulatory Visit (HOSPITAL_COMMUNITY): Payer: Self-pay

## 2020-06-25 ENCOUNTER — Other Ambulatory Visit (HOSPITAL_COMMUNITY): Payer: Self-pay

## 2020-06-25 NOTE — Telephone Encounter (Signed)
Spoke with pt to let him know that he can contact [facility] and tell them to send it to the pharmacy of his choice

## 2020-06-25 NOTE — Telephone Encounter (Signed)
Patient called and requested that we send this RX to the [facility] on [address]  [facility] is $80 and [facility] is cheaper

## 2020-06-26 ENCOUNTER — Other Ambulatory Visit: Payer: Self-pay | Admitting: *Deleted

## 2020-06-26 ENCOUNTER — Other Ambulatory Visit (HOSPITAL_COMMUNITY): Payer: Self-pay

## 2020-06-26 ENCOUNTER — Other Ambulatory Visit: Payer: Self-pay | Admitting: Emergency Medicine

## 2020-06-26 DIAGNOSIS — E785 Hyperlipidemia, unspecified: Secondary | ICD-10-CM

## 2020-06-26 DIAGNOSIS — I10 Essential (primary) hypertension: Secondary | ICD-10-CM

## 2020-06-26 MED ORDER — ATORVASTATIN CALCIUM 20 MG PO TABS
20.0000 mg | ORAL_TABLET | Freq: Every day | ORAL | 3 refills | Status: DC
  Filled 2020-06-26: qty 90, 90d supply, fill #0
  Filled 2020-09-23 – 2020-09-26 (×2): qty 90, 90d supply, fill #1
  Filled 2020-12-25: qty 90, 90d supply, fill #2
  Filled 2021-03-29: qty 90, 90d supply, fill #3

## 2020-06-26 MED ORDER — AMLODIPINE BESYLATE 5 MG PO TABS
5.0000 mg | ORAL_TABLET | Freq: Every day | ORAL | 3 refills | Status: DC
  Filled 2020-06-26: qty 90, 90d supply, fill #0
  Filled 2020-09-23 – 2020-09-26 (×2): qty 90, 90d supply, fill #1
  Filled 2020-12-25: qty 90, 90d supply, fill #2
  Filled 2021-03-29: qty 90, 90d supply, fill #3

## 2020-06-30 ENCOUNTER — Other Ambulatory Visit: Payer: Self-pay | Admitting: Endocrinology

## 2020-06-30 ENCOUNTER — Other Ambulatory Visit (HOSPITAL_COMMUNITY): Payer: Self-pay

## 2020-06-30 MED ORDER — OZEMPIC (1 MG/DOSE) 4 MG/3ML ~~LOC~~ SOPN
1.0000 mg | PEN_INJECTOR | SUBCUTANEOUS | 3 refills | Status: DC
  Filled 2020-06-30: qty 9, 84d supply, fill #0

## 2020-07-01 ENCOUNTER — Other Ambulatory Visit (HOSPITAL_COMMUNITY): Payer: Self-pay

## 2020-07-04 ENCOUNTER — Other Ambulatory Visit (HOSPITAL_COMMUNITY): Payer: Self-pay

## 2020-07-10 ENCOUNTER — Other Ambulatory Visit: Payer: Self-pay | Admitting: Endocrinology

## 2020-07-10 MED ORDER — UNIFINE PENTIPS 31G X 8 MM MISC
3 refills | Status: DC
  Filled 2020-07-10: qty 100, 50d supply, fill #0
  Filled 2020-10-22: qty 100, 50d supply, fill #1
  Filled 2021-02-02: qty 100, 50d supply, fill #2
  Filled 2021-05-20: qty 100, 50d supply, fill #3

## 2020-07-11 ENCOUNTER — Other Ambulatory Visit (HOSPITAL_COMMUNITY): Payer: Self-pay

## 2020-07-15 ENCOUNTER — Other Ambulatory Visit: Payer: Self-pay | Admitting: Endocrinology

## 2020-07-16 ENCOUNTER — Telehealth: Payer: Self-pay | Admitting: Endocrinology

## 2020-07-16 ENCOUNTER — Other Ambulatory Visit (HOSPITAL_COMMUNITY): Payer: Self-pay

## 2020-07-16 MED ORDER — METFORMIN HCL ER 500 MG PO TB24
1000.0000 mg | ORAL_TABLET | Freq: Every day | ORAL | 0 refills | Status: DC
  Filled 2020-07-16: qty 180, 90d supply, fill #0

## 2020-07-16 NOTE — Addendum Note (Signed)
Addended by: Renato Shin on: 07/16/2020 06:35 PM   Modules accepted: Orders

## 2020-07-16 NOTE — Telephone Encounter (Signed)
Patient called re: Patient states PHARM sent request for the following medication but that the RX was denied by Dr. Loanne Drilling and [facility] told Patient that he needed to schedule appointment (Patient was scheduled for his 4 month follow up on 09/01/20). Patient requests the following RX:  MEDICATION: metFORMIN (GLUCOPHAGE-XR) 500 MG 24 hr tablet  PHARMACY:    [place] Phone:  [phone]  Fax:  [phone]      HAS THE PATIENT CONTACTED [place]?  Yes-Please see above note  IS THIS A 90 DAY SUPPLY : Yes  IS PATIENT OUT OF MEDICATION: Yes  IF NOT; HOW MUCH IS LEFT: 0  LAST APPOINTMENT DATE: @2 /09/2020  NEXT APPOINTMENT DATE:@6 /20/2022  DO WE HAVE YOUR PERMISSION TO LEAVE A DETAILED MESSAGE?: Yes  OTHER COMMENTS:    **Let patient know to contact pharmacy at the end of the day to make sure medication is ready. **  ** Please notify patient to allow 48-72 hours to process**  **Encourage patient to contact the pharmacy for refills or they can request refills through [facility]**

## 2020-07-16 NOTE — Telephone Encounter (Signed)
I have sent a prescription to your pharmacy  

## 2020-07-17 ENCOUNTER — Other Ambulatory Visit (HOSPITAL_COMMUNITY): Payer: Self-pay

## 2020-07-28 ENCOUNTER — Other Ambulatory Visit (HOSPITAL_COMMUNITY): Payer: Self-pay

## 2020-07-28 MED FILL — Insulin Glargine Soln Pen-Injector 100 Unit/ML: SUBCUTANEOUS | 87 days supply | Qty: 54 | Fill #0 | Status: AC

## 2020-07-28 MED FILL — Insulin Glargine Soln Pen-Injector 100 Unit/ML: SUBCUTANEOUS | 29 days supply | Qty: 18 | Fill #0 | Status: CN

## 2020-07-31 DIAGNOSIS — E113392 Type 2 diabetes mellitus with moderate nonproliferative diabetic retinopathy without macular edema, left eye: Secondary | ICD-10-CM | POA: Diagnosis not present

## 2020-07-31 DIAGNOSIS — H40013 Open angle with borderline findings, low risk, bilateral: Secondary | ICD-10-CM | POA: Diagnosis not present

## 2020-07-31 DIAGNOSIS — E113311 Type 2 diabetes mellitus with moderate nonproliferative diabetic retinopathy with macular edema, right eye: Secondary | ICD-10-CM | POA: Diagnosis not present

## 2020-07-31 DIAGNOSIS — H524 Presbyopia: Secondary | ICD-10-CM | POA: Diagnosis not present

## 2020-07-31 DIAGNOSIS — H2513 Age-related nuclear cataract, bilateral: Secondary | ICD-10-CM | POA: Diagnosis not present

## 2020-07-31 LAB — HM DIABETES EYE EXAM

## 2020-08-18 ENCOUNTER — Other Ambulatory Visit: Payer: Self-pay | Admitting: Emergency Medicine

## 2020-08-18 ENCOUNTER — Other Ambulatory Visit (HOSPITAL_COMMUNITY): Payer: Self-pay

## 2020-08-18 DIAGNOSIS — G6289 Other specified polyneuropathies: Secondary | ICD-10-CM

## 2020-08-18 MED ORDER — GABAPENTIN 100 MG PO CAPS
100.0000 mg | ORAL_CAPSULE | Freq: Two times a day (BID) | ORAL | 0 refills | Status: DC
  Filled 2020-08-18: qty 180, 90d supply, fill #0

## 2020-09-01 ENCOUNTER — Other Ambulatory Visit (HOSPITAL_COMMUNITY): Payer: Self-pay

## 2020-09-01 ENCOUNTER — Other Ambulatory Visit: Payer: Self-pay

## 2020-09-01 ENCOUNTER — Ambulatory Visit (INDEPENDENT_AMBULATORY_CARE_PROVIDER_SITE_OTHER): Payer: 59 | Admitting: Endocrinology

## 2020-09-01 VITALS — BP 160/88 | HR 8 | Ht 74.0 in | Wt 263.8 lb

## 2020-09-01 DIAGNOSIS — E1141 Type 2 diabetes mellitus with diabetic mononeuropathy: Secondary | ICD-10-CM | POA: Diagnosis not present

## 2020-09-01 DIAGNOSIS — Z794 Long term (current) use of insulin: Secondary | ICD-10-CM

## 2020-09-01 LAB — POCT GLYCOSYLATED HEMOGLOBIN (HGB A1C): Hemoglobin A1C: 6.2 % — AB (ref 4.0–5.6)

## 2020-09-01 MED ORDER — INSULIN GLARGINE-YFGN 100 UNIT/ML ~~LOC~~ SOPN
40.0000 [IU] | PEN_INJECTOR | SUBCUTANEOUS | 3 refills | Status: DC
  Filled 2020-09-01: qty 36, 90d supply, fill #0
  Filled 2020-09-03: qty 45, 80d supply, fill #0

## 2020-09-01 NOTE — Progress Notes (Signed)
Subjective:    Patient ID: James Garcia, male    DOB: 1956/02/06, 65 y.o.   MRN: [id number]  HPI Pt returns for f/u of diabetes mellitus:  DM type: Insulin-requiring type 2 Dx'ed: 7412 Complications: PN and DR.   Therapy: insulin since 2016, Ozempic, and metformin.   DKA: never Severe hypoglycemia: never.   Pancreatitis: never.   SDOH: he works 2nd shift. Other: he declines multiple daily injections; He stopped farxiga, due to itching.   Interval history: Pt says he never misses the insulin. He brings his meter with his cbg's which I have reviewed today.  cbg's vary from 80-214.  Since the dosage reductions, he still has mild hypoglycemia approx once per month.  Past Medical History:  Diagnosis Date   Constipation    uses OTC laxatives - hard stools every morning- small amount and feels like doesnt empty    Diabetes mellitus without complication (HCC)    DM type 2 (diabetes mellitus, type 2) (HCC)    Hyperlipidemia    Hypertension    Leg cramps    Tuberculin skin test (TST) positive    Tuberculosis    pt states was treated for TB    Past Surgical History:  Procedure Laterality Date   arm surgery     COLONOSCOPY     ELBOW SURGERY     FOOT SURGERY Bilateral    POLYPECTOMY      Social History   Socioeconomic History   Marital status: Married    Spouse name: Not on file   Number of children: 2   Years of education: Not on file   Highest education level: Not on file  Occupational History   Occupation: Floor Tech at [employer]   Tobacco Use   Smoking status: Former    Packs/day: 0.25    Years: 5.00    Pack years: 1.25    Types: Cigarettes    Quit date: 06/04/1978    Years since quitting: 42.2   Smokeless tobacco: Never  Vaping Use   Vaping Use: Never used  Substance and Sexual Activity   Alcohol use: No   Drug use: No   Sexual activity: Not on file  Other Topics Concern   Not on file  Social History Narrative   Lives with family       Social Determinants of  Health   Financial Resource Strain: Not on file  Food Insecurity: Not on file  Transportation Needs: Not on file  Physical Activity: Not on file  Stress: Not on file  Social Connections: Not on file  Intimate Partner Violence: Not on file    Current Outpatient Medications on File Prior to Visit  Medication Sig Dispense Refill   amLODipine (NORVASC) 5 MG tablet TAKE 1 TABLET BY MOUTH ONCE A DAY 90 tablet 3   atorvastatin (LIPITOR) 20 MG tablet TAKE 1 TABLET BY MOUTH ONCE DAILY 90 tablet 3   Blood Glucose Monitoring Suppl (FREESTYLE LITE) DEVI 1 kit by Does not apply route daily. Use to test blood sugars daily 1 each 0   Blood Pressure Monitor KIT Use as instructed for HTN 1 each 0   cetirizine (ZYRTEC) 10 MG tablet Take 10 mg by mouth daily.     gabapentin (NEURONTIN) 100 MG capsule Take 1 capsule (100 mg total) by mouth 2 (two) times daily. 180 capsule 0   glucose blood (FREESTYLE LITE) test strip Use as instructed 200 each 12   glucose blood test strip USE TO CHECK  BLOOD SUGAR TWICE A DAY 50 strip 0   Insulin Pen Needle (UNIFINE PENTIPS) 31G X 8 MM MISC USE WITH INSULIN TWICE DAILY AS DIRECTED 100 each 3   Lancets (FREESTYLE) lancets USE TO CHECK BLOOD SUGAR 2 TIMES PER DAY. 200 each 2   losartan (COZAAR) 100 MG tablet Take 1 tablet (100 mg total) by mouth daily. 90 tablet 0   metFORMIN (GLUCOPHAGE-XR) 500 MG 24 hr tablet Take 2 tablets (1,000 mg total) by mouth daily with breakfast. 180 tablet 0   neomycin-bacitracin-polymyxin (NEOSPORIN) ointment Apply 1 application topically every 12 (twelve) hours. 15 g 0   pramipexole (MIRAPEX) 0.5 MG tablet Take 1 tablet (0.5 mg total) by mouth 3 (three) times daily. 30 tablet 5   Semaglutide, 1 MG/DOSE, (OZEMPIC, 1 MG/DOSE,) 2 MG/1.5ML SOPN Inject 1 mg into the skin once a week. 4.5 mL 3   UNIFINE PENTIPS 31G X 8 MM MISC USE WITH INSULIN TWICE DAILY AS DIRECTED     fluticasone (FLONASE) 50 MCG/ACT nasal spray Place 1-2 sprays into both nostrils  daily for 7 days. 1 g 0   [DISCONTINUED] albuterol (PROVENTIL HFA;VENTOLIN HFA) 108 (90 Base) MCG/ACT inhaler Inhale 1-2 puffs into the lungs every 4 (four) hours as needed for wheezing or shortness of breath. 1 Inhaler 0   Current Facility-Administered Medications on File Prior to Visit  Medication Dose Route Frequency Provider Last Rate Last Admin   0.9 %  sodium chloride infusion  500 mL Intravenous Once Pyrtle, Lajuan Lines, MD        No Known Allergies  Family History  Problem Relation Age of Onset   Diabetes Father    Colon cancer Neg Hx    Colon polyps Neg Hx    Rectal cancer Neg Hx    Stomach cancer Neg Hx     BP (!) 160/88   Pulse (!) 8   Ht '6\' 2"'  (1.88 m)   Wt 263 lb 12.8 oz (119.7 kg)   SpO2 98%   BMI 33.87 kg/m    Review of Systems     Objective:   Physical Exam GENERAL: no distress Pulses: dorsalis pedis intact bilat.   MSK: no deformity of the feet CV: trace bilat leg edema Skin:  no ulcer on the feet.  normal color and temp on the feet. Neuro: sensation is intact to touch on the feet, but decreased from normal.   Lab Results  Component Value Date   HGBA1C 6.2 (A) 09/01/2020   Lab Results  Component Value Date   CREATININE 0.93 11/26/2019   BUN 17 11/26/2019   NA 140 11/26/2019   K 4.6 11/26/2019   CL 103 11/26/2019   CO2 23 11/26/2019       Assessment & Plan:  Insulin-requiring type 2 DM: overcontrolled.    Patient Instructions  Please reduce the insulin to 40 units each morning, and continue the same Ozempic, and metformin.   check your blood sugar twice a day.  vary the time of day when you check, between before the 3 meals, and at bedtime.  also check if you have symptoms of your blood sugar being too high or too low.  please keep a record of the readings and bring it to your next appointment here (or you can bring the meter itself).  You can write it on any piece of paper.  please call us sooner if your blood sugar goes below 70, or if you have  a lot of readings over 200.  Please come back for a follow-up appointment in 3 months.

## 2020-09-01 NOTE — Patient Instructions (Addendum)
Please reduce the insulin to 40 units each morning, and continue the same Ozempic, and metformin.   check your blood sugar twice a day.  vary the time of day when you check, between before the 3 meals, and at bedtime.  also check if you have symptoms of your blood sugar being too high or too low.  please keep a record of the readings and bring it to your next appointment here (or you can bring the meter itself).  You can write it on any piece of paper.  please call us sooner if your blood sugar goes below 70, or if you have a lot of readings over 200.   Please come back for a follow-up appointment in 3 months.

## 2020-09-02 ENCOUNTER — Other Ambulatory Visit (HOSPITAL_COMMUNITY): Payer: Self-pay

## 2020-09-02 ENCOUNTER — Other Ambulatory Visit: Payer: Self-pay | Admitting: Emergency Medicine

## 2020-09-02 MED ORDER — OMEPRAZOLE 20 MG PO CPDR
20.0000 mg | DELAYED_RELEASE_CAPSULE | Freq: Every morning | ORAL | 0 refills | Status: DC
  Filled 2020-09-02: qty 90, 90d supply, fill #0

## 2020-09-03 ENCOUNTER — Other Ambulatory Visit (HOSPITAL_COMMUNITY): Payer: Self-pay

## 2020-09-03 ENCOUNTER — Other Ambulatory Visit: Payer: Self-pay | Admitting: Endocrinology

## 2020-09-03 ENCOUNTER — Ambulatory Visit: Payer: 59 | Attending: Internal Medicine

## 2020-09-03 DIAGNOSIS — Z23 Encounter for immunization: Secondary | ICD-10-CM

## 2020-09-03 MED ORDER — BASAGLAR KWIKPEN 100 UNIT/ML ~~LOC~~ SOPN
40.0000 [IU] | PEN_INJECTOR | SUBCUTANEOUS | 3 refills | Status: DC
  Filled 2020-09-03: qty 45, 112d supply, fill #0
  Filled 2020-10-21: qty 36, 90d supply, fill #0

## 2020-09-03 NOTE — Progress Notes (Signed)
   Covid-19 Vaccination Clinic  Name:  James Garcia    MRN: [id number] DOB: 1955-09-13  09/03/2020  James Garcia was observed post Covid-19 immunization for 15 minutes without incident. He was provided with Vaccine Information Sheet and instruction to access the V-Safe system.   James Garcia was instructed to call 911 with any severe reactions post vaccine: Difficulty breathing  Swelling of face and throat  A fast heartbeat  A bad rash all over body  Dizziness and weakness   Immunizations Administered     Name Date Dose VIS Date Route   PFIZER Comrnaty(Gray TOP) Covid-19 Vaccine 09/03/2020 12:06 PM 0.3 mL 02/21/2020 Intramuscular   Manufacturer: [place]   Lot: [id number]   [place]: [place], PharmD, MBA Clinical Acute Care Pharmacist

## 2020-09-04 ENCOUNTER — Other Ambulatory Visit (HOSPITAL_COMMUNITY): Payer: Self-pay

## 2020-09-04 MED ORDER — COVID-19 MRNA VAC-TRIS(PFIZER) 30 MCG/0.3ML IM SUSP
INTRAMUSCULAR | 0 refills | Status: DC
  Filled 2020-09-04: qty 0.3, 17d supply, fill #0

## 2020-09-05 ENCOUNTER — Other Ambulatory Visit (HOSPITAL_COMMUNITY): Payer: Self-pay

## 2020-09-23 ENCOUNTER — Other Ambulatory Visit (HOSPITAL_COMMUNITY): Payer: Self-pay

## 2020-09-23 ENCOUNTER — Other Ambulatory Visit: Payer: Self-pay | Admitting: Internal Medicine

## 2020-09-26 ENCOUNTER — Other Ambulatory Visit (HOSPITAL_COMMUNITY): Payer: Self-pay

## 2020-10-14 ENCOUNTER — Other Ambulatory Visit (HOSPITAL_COMMUNITY): Payer: Self-pay

## 2020-10-14 ENCOUNTER — Other Ambulatory Visit: Payer: Self-pay | Admitting: Endocrinology

## 2020-10-14 MED ORDER — METFORMIN HCL ER 500 MG PO TB24
1000.0000 mg | ORAL_TABLET | Freq: Every day | ORAL | 0 refills | Status: DC
  Filled 2020-10-14: qty 180, 90d supply, fill #0

## 2020-10-15 ENCOUNTER — Other Ambulatory Visit (HOSPITAL_COMMUNITY): Payer: Self-pay

## 2020-10-21 ENCOUNTER — Other Ambulatory Visit: Payer: Self-pay | Admitting: Endocrinology

## 2020-10-21 ENCOUNTER — Other Ambulatory Visit (HOSPITAL_COMMUNITY): Payer: Self-pay

## 2020-10-21 MED ORDER — OZEMPIC (1 MG/DOSE) 4 MG/3ML ~~LOC~~ SOPN
1.0000 mg | PEN_INJECTOR | SUBCUTANEOUS | 3 refills | Status: DC
  Filled 2020-10-21: qty 3, 28d supply, fill #0
  Filled 2020-12-01: qty 9, 84d supply, fill #1

## 2020-10-22 ENCOUNTER — Other Ambulatory Visit (HOSPITAL_COMMUNITY): Payer: Self-pay

## 2020-10-28 ENCOUNTER — Other Ambulatory Visit (HOSPITAL_COMMUNITY): Payer: Self-pay

## 2020-11-10 ENCOUNTER — Encounter: Payer: Self-pay | Admitting: Emergency Medicine

## 2020-11-10 ENCOUNTER — Other Ambulatory Visit: Payer: Self-pay

## 2020-11-10 ENCOUNTER — Ambulatory Visit: Payer: 59 | Admitting: Emergency Medicine

## 2020-11-10 VITALS — BP 130/70 | HR 66 | Temp 98.2°F | Ht 74.0 in | Wt 263.0 lb

## 2020-11-10 DIAGNOSIS — I152 Hypertension secondary to endocrine disorders: Secondary | ICD-10-CM | POA: Diagnosis not present

## 2020-11-10 DIAGNOSIS — E1159 Type 2 diabetes mellitus with other circulatory complications: Secondary | ICD-10-CM | POA: Diagnosis not present

## 2020-11-10 DIAGNOSIS — Z23 Encounter for immunization: Secondary | ICD-10-CM

## 2020-11-10 DIAGNOSIS — R109 Unspecified abdominal pain: Secondary | ICD-10-CM | POA: Diagnosis not present

## 2020-11-10 LAB — COMPREHENSIVE METABOLIC PANEL
ALT: 28 U/L (ref 0–53)
AST: 20 U/L (ref 0–37)
Albumin: 4 g/dL (ref 3.5–5.2)
Alkaline Phosphatase: 73 U/L (ref 39–117)
BUN: 15 mg/dL (ref 6–23)
CO2: 28 mEq/L (ref 19–32)
Calcium: 8.8 mg/dL (ref 8.4–10.5)
Chloride: 102 mEq/L (ref 96–112)
Creatinine, Ser: 0.86 mg/dL (ref 0.40–1.50)
GFR: 90.79 mL/min (ref >60.00)
Glucose, Bld: 104 mg/dL — ABNORMAL HIGH (ref 70–99)
Potassium: 4.1 mEq/L (ref 3.5–5.1)
Sodium: 138 mEq/L (ref 135–145)
Total Bilirubin: 0.8 mg/dL (ref 0.2–1.2)
Total Protein: 7.1 g/dL (ref 6.0–8.3)

## 2020-11-10 LAB — POCT URINALYSIS DIP (MANUAL ENTRY)
Bilirubin, UA: NEGATIVE
Blood, UA: NEGATIVE
Glucose, UA: NEGATIVE mg/dL
Ketones, POC UA: NEGATIVE mg/dL
Leukocytes, UA: NEGATIVE
Nitrite, UA: NEGATIVE
Protein Ur, POC: NEGATIVE mg/dL
Spec Grav, UA: 1.02 (ref 1.010–1.025)
Urobilinogen, UA: 0.2 E.U./dL
pH, UA: 6.5 (ref 5.0–8.0)

## 2020-11-10 LAB — CBC WITH DIFFERENTIAL/PLATELET
Basophils Absolute: 0 10*3/uL (ref 0.0–0.1)
Basophils Relative: 0.6 % (ref 0.0–3.0)
Eosinophils Absolute: 0.3 10*3/uL (ref 0.0–0.7)
Eosinophils Relative: 3.6 % (ref 0.0–5.0)
HCT: 46.3 % (ref 39.0–52.0)
Hemoglobin: 15.7 g/dL (ref 13.0–17.0)
Lymphocytes Relative: 25.6 % (ref 12.0–46.0)
Lymphs Abs: 2 10*3/uL (ref 0.7–4.0)
MCHC: 33.8 g/dL (ref 30.0–36.0)
MCV: 90.3 fl (ref 78.0–100.0)
Monocytes Absolute: 0.6 10*3/uL (ref 0.1–1.0)
Monocytes Relative: 7.3 % (ref 3.0–12.0)
Neutro Abs: 4.8 10*3/uL (ref 1.4–7.7)
Neutrophils Relative %: 62.9 % (ref 43.0–77.0)
Platelets: 132 10*3/uL — ABNORMAL LOW (ref 150.0–400.0)
RBC: 5.13 Mil/uL (ref 4.22–5.81)
RDW: 13.4 % (ref 11.5–15.5)
WBC: 7.6 10*3/uL (ref 4.0–10.5)

## 2020-11-10 NOTE — Assessment & Plan Note (Signed)
Differential diagnosis discussed with patient. Needs diagnostic work-up including renal ultrasound. Blood work and urinalysis done today. Follow-up after ultrasound is done.

## 2020-11-10 NOTE — Patient Instructions (Signed)
Dolor en la dolor en la fosa lumbar en adultos Flank Pain, Adult El dolor en la fosa lumbar es aquel dolor que se siente en un lado del cuerpo. El flanco es la zona que se localiza en un lado del cuerpo, entre la parte superior del vientre (abdomen) y la espalda. El Mining engineer en un perodo corto de [place] o puede durar mucho tiempo o reaparecer con frecuencia (crnico). Puede ser leve o muy intenso. El dolor en esta zona puede tener diferentescausas. Siga estas instrucciones en su casa:  Beba suficiente lquido como para mantener el pis (orina) claro o de color amarillo plido. Haga reposo como se lo haya indicado el mdico. Use los medicamentos de venta libre y los recetados solamente como se lo haya indicado el mdico. Realice un seguimiento por escrito de lo siguiente: Lo que le caus dolor en la fosa lumbar. Lo que lo alivi. Concurra a todas las visitas de seguimiento como se lo haya indicado el mdico. Esto es importante. Comunquese con un mdico si: Los medicamentos no Forensic psychologist. Aparecen nuevos sntomas. El dolor [place]. Tiene fiebre. Los sntomas duran ms de 2 a 3 das. Tiene dificultad para orinar. Orina con ms frecuencia que lo normal. Solicita ayuda de inmediato si: Tiene dificultad para respirar. Le falta el aire. Le duele el vientre, o este est hinchado o enrojecido. Siente malestar estomacal (nuseas). Vomita. Siente que se desvanecer o pierde el conocimiento (se desmaya). Observa sangre en la orina. Resumen El dolor en la fosa lumbar es aquel dolor que se siente en un lado del cuerpo. El flanco es la zona que se localiza en un lado del cuerpo, entre la parte superior del vientre (abdomen) y la espalda. El dolor en la fosa lumbar puede aparecer en un perodo corto de tiempo (agudo) o puede durar mucho tiempo o reaparecer con frecuencia (crnico). Puede ser leve o muy intenso. El dolor en esta zona puede tener diferentes  causas. Comunquese con su mdico si los sntomas empeoran o si duran ms de 2 a 3 das. Esta informacin no tiene Marine scientist el consejo del mdico. Asegresede hacerle al mdico cualquier pregunta que tenga. Document Revised: 12/19/2019 Document Reviewed: 11/27/2019 Elsevier Patient Education  2022 [employer].

## 2020-11-10 NOTE — Progress Notes (Signed)
James Garcia 65 y.o.   Chief Complaint  Patient presents with   Back Pain    Lower right side, pain comes and goes    HISTORY OF PRESENT ILLNESS: This is a 65 y.o. male complaining of right flank pain for close to a year. Urine at times is reddish and sometimes it burns. Seen by me last September for the same.  Renal ultrasound was ordered but never scheduled. Diabetic who sees endocrinologist on a regular basis.  Last hemoglobin A1c was 6.2.  On multiple medications including insulin.  Doing well.  Stable. No other significant associated symptoms.  Back Pain Pertinent negatives include no abdominal pain, chest pain, dysuria, fever or headaches.    Prior to Admission medications   Medication Sig Start Date End Date Taking? Authorizing Provider  amLODipine (NORVASC) 5 MG tablet TAKE 1 TABLET BY MOUTH ONCE A DAY 06/26/20  Yes Kvon Mcilhenny, Ines Bloomer, MD  atorvastatin (LIPITOR) 20 MG tablet TAKE 1 TABLET BY MOUTH ONCE DAILY 06/26/20  Yes Amylia Collazos, Ines Bloomer, MD  Blood Glucose Monitoring Suppl (FREESTYLE LITE) DEVI 1 kit by Does not apply route daily. Use to test blood sugars daily 04/14/18  Yes Renato Shin, MD  cetirizine (ZYRTEC) 10 MG tablet Take 10 mg by mouth daily. 08/30/19  Yes [provider]  COVID-19 mRNA Vac-TriS, Pfizer, SUSP injection Inject into the muscle. 09/03/20  Yes Carlyle Basques, MD  gabapentin (NEURONTIN) 100 MG capsule Take 1 capsule (100 mg total) by mouth 2 (two) times daily. 08/18/20  Yes Rayn Enderson, Ines Bloomer, MD  glucose blood (FREESTYLE LITE) test strip Use as instructed 06/23/20  Yes Renato Shin, MD  glucose blood test strip USE TO CHECK BLOOD SUGAR TWICE A DAY 05/28/20 05/28/21 Yes Renato Shin, MD  Insulin Glargine [facility]) 100 UNIT/ML Inject 40 Units into the skin every morning. 09/03/20  Yes Renato Shin, MD  Insulin Pen Needle (UNIFINE PENTIPS) 31G X 8 MM MISC USE WITH INSULIN TWICE DAILY AS DIRECTED 07/10/20 07/10/21 Yes Renato Shin, MD   Lancets (FREESTYLE) lancets USE TO CHECK BLOOD SUGAR 2 TIMES PER DAY. 11/02/17  Yes Renato Shin, MD  losartan (COZAAR) 100 MG tablet Take 1 tablet (100 mg total) by mouth daily. 11/30/17  Yes Wendie Agreste, MD  metFORMIN (GLUCOPHAGE-XR) 500 MG 24 hr tablet Take 2 tablets (1,000 mg total) by mouth daily with breakfast. 10/14/20  Yes Renato Shin, MD  neomycin-bacitracin-polymyxin (NEOSPORIN) ointment Apply 1 application topically every 12 (twelve) hours. 09/20/19  Yes Rosemarie Ax, MD  omeprazole (PRILOSEC) 20 MG capsule Take 1 capsule (20 mg total) by mouth every morning. 09/02/20 09/02/21 Yes Dayshon Roback, Ines Bloomer, MD  pramipexole (MIRAPEX) 0.5 MG tablet Take 1 tablet (0.5 mg total) by mouth 3 (three) times daily. 11/28/19  Yes Sater, Nanine Means, MD  Semaglutide, 1 MG/DOSE, (OZEMPIC, 1 MG/DOSE,) 2 MG/1.5ML SOPN Inject 1 mg into the skin once a week. 01/18/20  Yes Renato Shin, MD  Semaglutide, 1 MG/DOSE, (OZEMPIC, 1 MG/DOSE,) 4 MG/3ML SOPN INJECT 1 MG INTO THE SKIN ONCE A WEEK. 10/21/20 10/21/21 Yes Renato Shin, MD  UNIFINE PENTIPS 31G X 8 MM MISC USE WITH INSULIN TWICE DAILY AS DIRECTED 09/13/19  Yes [provider]  Blood Pressure Monitor KIT Use as instructed for HTN Patient not taking: Reported on 11/10/2020 07/12/17   Wendie Agreste, MD  fluticasone [facility]) 50 MCG/ACT nasal spray Place 1-2 sprays into both nostrils daily for 7 days. 08/29/19 09/05/19  Wieters, Elesa Hacker, PA-C  albuterol (PROVENTIL HFA;VENTOLIN HFA) 108 (90 Base) MCG/ACT inhaler Inhale 1-2 puffs into the lungs every 4 (four) hours as needed for wheezing or shortness of breath. 12/24/17 08/29/19  Wendie Agreste, MD    Allergies  Allergen Reactions   Insulin Glargine Hives    specifically Semglee    Patient Active Problem List   Diagnosis Date Noted   Diplopia 11/28/2019   Restless leg 11/28/2019   Right abducens nerve palsy 11/27/2019   Left leg pain 09/24/2019   Diabetes ([place]) 05/01/2015    Past Medical  History:  Diagnosis Date   Constipation    uses OTC laxatives - hard stools every morning- small amount and feels like doesnt empty    Diabetes mellitus without complication ([place])    DM type 2 (diabetes mellitus, type 2) (HCC)    Hyperlipidemia    Hypertension    Leg cramps    Tuberculin skin test (TST) positive    Tuberculosis    pt states was treated for TB    Past Surgical History:  Procedure Laterality Date   arm surgery     COLONOSCOPY     ELBOW SURGERY     FOOT SURGERY Bilateral    POLYPECTOMY      Social History   Socioeconomic History   Marital status: Married    Spouse name: Not on file   Number of children: 2   Years of education: Not on file   Highest education level: Not on file  Occupational History   Occupation: Floor Tech at [employer]   Tobacco Use   Smoking status: Former    Packs/day: 0.25    Years: 5.00    Pack years: 1.25    Types: Cigarettes    Quit date: 06/04/1978    Years since quitting: 42.4   Smokeless tobacco: Never  Vaping Use   Vaping Use: Never used  Substance and Sexual Activity   Alcohol use: No   Drug use: No   Sexual activity: Not on file  Other Topics Concern   Not on file  Social History Narrative   Lives with family       Social Determinants of Health   Financial Resource Strain: Not on file  Food Insecurity: Not on file  Transportation Needs: Not on file  Physical Activity: Not on file  Stress: Not on file  Social Connections: Not on file  Intimate Partner Violence: Not on file    Family History  Problem Relation Age of Onset   Diabetes Father    Colon cancer Neg Hx    Colon polyps Neg Hx    Rectal cancer Neg Hx    Stomach cancer Neg Hx      Review of Systems  Constitutional: Negative.  Negative for chills, fever and malaise/fatigue.  HENT: Negative.  Negative for congestion and sore throat.   Respiratory:  Negative for cough and shortness of breath.   Cardiovascular:  Negative for chest pain and  palpitations.  Gastrointestinal: Negative.  Negative for abdominal pain, diarrhea, nausea and vomiting.  Genitourinary:  Positive for hematuria. Negative for dysuria.  Musculoskeletal:  Positive for back pain.  Skin: Negative.  Negative for rash.  Neurological: Negative.  Negative for dizziness and headaches.  All other systems reviewed and are negative.   Physical Exam Vitals reviewed.  Constitutional:      Appearance: Normal appearance.  HENT:     Head: Normocephalic.  Eyes:     Extraocular Movements: Extraocular movements intact.  Conjunctiva/sclera: Conjunctivae normal.     Pupils: Pupils are equal, round, and reactive to light.  Cardiovascular:     Rate and Rhythm: Normal rate and regular rhythm.     Pulses: Normal pulses.     Heart sounds: Normal heart sounds.  Pulmonary:     Effort: Pulmonary effort is normal.     Breath sounds: Normal breath sounds.  Abdominal:     General: Bowel sounds are normal.     Palpations: Abdomen is soft.     Tenderness: There is no right CVA tenderness or left CVA tenderness.  Musculoskeletal:     Cervical back: Normal range of motion and neck supple.     Right lower leg: No edema.     Left lower leg: No edema.  Skin:    General: Skin is warm and dry.     Capillary Refill: Capillary refill takes less than 2 seconds.  Neurological:     General: No focal deficit present.     Mental Status: He is alert and oriented to person, place, and time.  Psychiatric:        Mood and Affect: Mood normal.        Behavior: Behavior normal.   Vitals:   11/10/20 1325  BP: (!) 148/86  Pulse: 66  Temp: 98.2 F (36.8 C)  SpO2: 98%   Results for orders placed or performed in visit on 11/10/20 (from the past 24 hour(s))  POCT urinalysis dipstick     Status: None   Collection Time: 11/10/20  2:12 PM  Result Value Ref Range   Color, UA yellow yellow   Clarity, UA clear clear   Glucose, UA negative negative mg/dL   Bilirubin, UA negative negative    Ketones, POC UA negative negative mg/dL   Spec Grav, UA 1.020 1.010 - 1.025   Blood, UA negative negative   pH, UA 6.5 5.0 - 8.0   Protein Ur, POC negative negative mg/dL   Urobilinogen, UA 0.2 0.2 or 1.0 E.U./dL   Nitrite, UA Negative Negative   Leukocytes, UA Negative Negative     ASSESSMENT & PLAN: Right flank pain Differential diagnosis discussed with patient. Needs diagnostic work-up including renal ultrasound. Blood work and urinalysis done today. Follow-up after ultrasound is done.  Hypertension associated with diabetes ([place]) Well-controlled hypertension.  Continue losartan 100 mg and amlodipine 5 mg daily. Well-controlled diabetes with hemoglobin A1c of 6.2.  Continue present medications.  No changes.  Continue follow-up with endocrinologist. Diet and nutrition discussed.  Travas was seen today for back pain.  Diagnoses and all orders for this visit:  Right flank pain -     Comprehensive metabolic panel -     CBC with Differential/Platelet -     POCT urinalysis dipstick -     US Renal; Future  Need for influenza vaccination -     Flu Vaccine QUAD High Dose(Fluad)  Hypertension associated with diabetes [facility])  Patient Instructions  Dolor en la dolor en la fosa lumbar en adultos Flank Pain, Adult El dolor en la fosa lumbar es aquel dolor que se siente en un lado del cuerpo. El flanco es la zona que se localiza en un lado del cuerpo, entre la parte superior del vientre (abdomen) y la espalda. El Mining engineer en un perodo corto de [place] ([place]) o puede durar mucho tiempo o reaparecer con frecuencia (crnico). Puede ser leve o muy intenso. El dolor en esta zona puede tener diferentescausas. Siga estas instrucciones en su  casa:  Beba suficiente lquido como para mantener el pis (orina) claro o de color amarillo plido. Haga reposo como se lo haya indicado el mdico. Use los medicamentos de venta libre y los recetados solamente como se lo haya indicado el  mdico. Realice un seguimiento por escrito de lo siguiente: Lo que le caus dolor en la fosa lumbar. Lo que lo alivi. Concurra a todas las visitas de seguimiento como se lo haya indicado el mdico. Esto es importante. Comunquese con un mdico si: Los medicamentos no Forensic psychologist. Aparecen nuevos sntomas. El dolor [place]. Tiene fiebre. Los sntomas duran ms de 2 a 3 das. Tiene dificultad para orinar. Orina con ms frecuencia que lo normal. Solicita ayuda de inmediato si: Tiene dificultad para respirar. Le falta el aire. Le duele el vientre, o este est hinchado o enrojecido. Siente malestar estomacal (nuseas). Vomita. Siente que se desvanecer o pierde el conocimiento (se desmaya). Observa sangre en la orina. Resumen El dolor en la fosa lumbar es aquel dolor que se siente en un lado del cuerpo. El flanco es la zona que se localiza en un lado del cuerpo, entre la parte superior del vientre (abdomen) y la espalda. El dolor en la fosa lumbar puede aparecer en un perodo corto de tiempo (agudo) o puede durar mucho tiempo o reaparecer con frecuencia (crnico). Puede ser leve o muy intenso. El dolor en esta zona puede tener diferentes causas. Comunquese con su mdico si los sntomas empeoran o si duran ms de 2 a 3 das. Esta informacin no tiene Marine scientist el consejo del mdico. Asegresede hacerle al mdico cualquier pregunta que tenga. Document Revised: 12/19/2019 Document Reviewed: 11/27/2019 Elsevier Patient Education  2022 [place], MD [facility]

## 2020-11-10 NOTE — Assessment & Plan Note (Signed)
Well-controlled hypertension.  Continue losartan 100 mg and amlodipine 5 mg daily. Well-controlled diabetes with hemoglobin A1c of 6.2.  Continue present medications.  No changes.  Continue follow-up with endocrinologist. Diet and nutrition discussed.

## 2020-11-12 ENCOUNTER — Other Ambulatory Visit: Payer: Self-pay

## 2020-11-12 ENCOUNTER — Ambulatory Visit
Admission: RE | Admit: 2020-11-12 | Discharge: 2020-11-12 | Disposition: A | Discharge status: Home / Self Care | Payer: 59 | Source: Ambulatory Visit | Attending: Emergency Medicine | Admitting: Emergency Medicine

## 2020-11-12 DIAGNOSIS — R109 Unspecified abdominal pain: Secondary | ICD-10-CM | POA: Diagnosis not present

## 2020-11-12 DIAGNOSIS — G8929 Other chronic pain: Secondary | ICD-10-CM | POA: Diagnosis not present

## 2020-11-13 ENCOUNTER — Telehealth: Payer: Self-pay | Admitting: Emergency Medicine

## 2020-11-13 ENCOUNTER — Other Ambulatory Visit (HOSPITAL_COMMUNITY): Payer: Self-pay

## 2020-11-13 ENCOUNTER — Other Ambulatory Visit: Payer: Self-pay | Admitting: Emergency Medicine

## 2020-11-13 ENCOUNTER — Encounter: Payer: Self-pay | Admitting: Emergency Medicine

## 2020-11-13 DIAGNOSIS — G6289 Other specified polyneuropathies: Secondary | ICD-10-CM

## 2020-11-13 MED ORDER — GABAPENTIN 100 MG PO CAPS
100.0000 mg | ORAL_CAPSULE | Freq: Two times a day (BID) | ORAL | 0 refills | Status: DC
  Filled 2020-11-13 – 2020-11-19 (×2): qty 180, 90d supply, fill #0

## 2020-11-13 NOTE — Telephone Encounter (Signed)
Patient would like nurse or provider to give him a call w/ the results from his imaging test  Please call [phone]

## 2020-11-14 ENCOUNTER — Other Ambulatory Visit: Payer: Self-pay | Admitting: Emergency Medicine

## 2020-11-14 DIAGNOSIS — R109 Unspecified abdominal pain: Secondary | ICD-10-CM

## 2020-11-14 DIAGNOSIS — R1011 Right upper quadrant pain: Secondary | ICD-10-CM

## 2020-11-18 NOTE — Telephone Encounter (Signed)
Please provide work excuse. Thanks.

## 2020-11-18 NOTE — Telephone Encounter (Signed)
Please advise as the pt has called in regards to his CT Scan referral and is wondering if this has been done.  **Pt has also stated he is still in a lot of pain and is wanting to know if the note can be for until after his CT Scan is done and tx for whatever the findings are. Pt states he can not work with the amount of pain he is currently in.

## 2020-11-18 NOTE — Telephone Encounter (Signed)
Called and left a vm to ask about work excuse note.

## 2020-11-18 NOTE — Telephone Encounter (Signed)
Dr. Mitchel Honour sent pt a mychart message regarding is results.

## 2020-11-19 ENCOUNTER — Other Ambulatory Visit (HOSPITAL_COMMUNITY): Payer: Self-pay

## 2020-11-20 NOTE — Telephone Encounter (Signed)
Called and spoke with pt, pt has CT scan scheduled for 11/27/20. Created a work for pt and faxed to employer.

## 2020-11-25 ENCOUNTER — Telehealth: Payer: Self-pay

## 2020-11-25 NOTE — Telephone Encounter (Signed)
Okay, thanks

## 2020-11-25 NOTE — Telephone Encounter (Signed)
[place] [facility] called and stated in order to conduct pt's exam, they needed to change the CT order to renal study to rule out kidney stones. Advised them the change would be fine.

## 2020-11-26 NOTE — Telephone Encounter (Signed)
Patient requesting a call to discuss work note. Also requesting note be sent to [employer]. Patient made aware he can sent note to [employer] as well   Please call

## 2020-11-26 NOTE — Telephone Encounter (Signed)
Called and spoke with pt about FMLA forms. Forms have been completed and faxed back to [employer].

## 2020-11-27 ENCOUNTER — Other Ambulatory Visit: Payer: Self-pay

## 2020-11-27 ENCOUNTER — Ambulatory Visit (INDEPENDENT_AMBULATORY_CARE_PROVIDER_SITE_OTHER)
Admission: RE | Admit: 2020-11-27 | Discharge: 2020-11-27 | Disposition: A | Discharge status: Home / Self Care | Payer: 59 | Source: Ambulatory Visit | Attending: Emergency Medicine | Admitting: Emergency Medicine

## 2020-11-27 DIAGNOSIS — R1011 Right upper quadrant pain: Secondary | ICD-10-CM | POA: Diagnosis not present

## 2020-11-27 DIAGNOSIS — R109 Unspecified abdominal pain: Secondary | ICD-10-CM | POA: Diagnosis not present

## 2020-11-27 DIAGNOSIS — K8020 Calculus of gallbladder without cholecystitis without obstruction: Secondary | ICD-10-CM | POA: Diagnosis not present

## 2020-11-27 DIAGNOSIS — K5730 Diverticulosis of large intestine without perforation or abscess without bleeding: Secondary | ICD-10-CM | POA: Diagnosis not present

## 2020-11-27 DIAGNOSIS — R319 Hematuria, unspecified: Secondary | ICD-10-CM | POA: Diagnosis not present

## 2020-11-27 DIAGNOSIS — K429 Umbilical hernia without obstruction or gangrene: Secondary | ICD-10-CM | POA: Diagnosis not present

## 2020-12-01 ENCOUNTER — Other Ambulatory Visit (HOSPITAL_COMMUNITY): Payer: Self-pay

## 2020-12-01 ENCOUNTER — Ambulatory Visit: Payer: 59 | Admitting: Emergency Medicine

## 2020-12-01 ENCOUNTER — Encounter: Payer: Self-pay | Admitting: Emergency Medicine

## 2020-12-01 ENCOUNTER — Other Ambulatory Visit: Payer: Self-pay

## 2020-12-01 VITALS — BP 130/70 | HR 71 | Ht 74.0 in | Wt 264.0 lb

## 2020-12-01 DIAGNOSIS — K429 Umbilical hernia without obstruction or gangrene: Secondary | ICD-10-CM | POA: Insufficient documentation

## 2020-12-01 DIAGNOSIS — K8020 Calculus of gallbladder without cholecystitis without obstruction: Secondary | ICD-10-CM | POA: Diagnosis not present

## 2020-12-01 DIAGNOSIS — K5790 Diverticulosis of intestine, part unspecified, without perforation or abscess without bleeding: Secondary | ICD-10-CM

## 2020-12-01 DIAGNOSIS — I700 Atherosclerosis of aorta: Secondary | ICD-10-CM | POA: Diagnosis not present

## 2020-12-01 DIAGNOSIS — M549 Dorsalgia, unspecified: Secondary | ICD-10-CM | POA: Insufficient documentation

## 2020-12-01 DIAGNOSIS — E1159 Type 2 diabetes mellitus with other circulatory complications: Secondary | ICD-10-CM

## 2020-12-01 DIAGNOSIS — M545 Low back pain, unspecified: Secondary | ICD-10-CM | POA: Diagnosis not present

## 2020-12-01 DIAGNOSIS — K5909 Other constipation: Secondary | ICD-10-CM | POA: Diagnosis not present

## 2020-12-01 DIAGNOSIS — I152 Hypertension secondary to endocrine disorders: Secondary | ICD-10-CM

## 2020-12-01 MED ORDER — LINACLOTIDE 145 MCG PO CAPS
145.0000 ug | ORAL_CAPSULE | Freq: Every day | ORAL | 1 refills | Status: DC
  Filled 2020-12-01 – 2020-12-18 (×2): qty 30, 30d supply, fill #0

## 2020-12-01 NOTE — Assessment & Plan Note (Signed)
Well-controlled hypertension.  Continue losartan 100 mg daily and amlodipine 5 mg daily. BP Readings from Last 3 Encounters:  12/01/20 130/70  11/10/20 130/70  09/01/20 (!) 160/88

## 2020-12-01 NOTE — Assessment & Plan Note (Addendum)
With history of chronic constipation.  May try Linzess as needed. High-fiber diet recommended.

## 2020-12-01 NOTE — Assessment & Plan Note (Signed)
Needs orthopedic evaluation.  May need MRI of lumbar spine.  May benefit from physical therapy.

## 2020-12-01 NOTE — Progress Notes (Signed)
James Garcia 65 y.o.   Chief Complaint  Patient presents with   Results    Pt here to discuss CT results on abd.    HISTORY OF PRESENT ILLNESS: This is a 65 y.o. male with history of chronic right-sided flank/lumbar pain here for review of most recent CT scan of abdomen and pelvis done on 11/27/2020.  It showed the following: #1 cholelithiasis without cholecystitis #2 diverticulosis without diverticulitis #3 umbilical hernia #4 aortic atherosclerosis #5 degenerative changes of thoracic spine Pain most likely of musculoskeletal origin. States pain is worse with work and movement, better with rest.  HPI   Prior to Admission medications   Medication Sig Start Date End Date Taking? Authorizing Provider  amLODipine (NORVASC) 5 MG tablet TAKE 1 TABLET BY MOUTH ONCE A DAY 06/26/20  Yes Nyia Tsao, Ines Bloomer, MD  atorvastatin (LIPITOR) 20 MG tablet TAKE 1 TABLET BY MOUTH ONCE DAILY 06/26/20  Yes Viveka Wilmeth, Ines Bloomer, MD  Blood Glucose Monitoring Suppl (FREESTYLE LITE) DEVI 1 kit by Does not apply route daily. Use to test blood sugars daily 04/14/18  Yes Renato Shin, MD  Blood Pressure Monitor KIT Use as instructed for HTN 07/12/17  Yes Wendie Agreste, MD  cetirizine (ZYRTEC) 10 MG tablet Take 10 mg by mouth daily. 08/30/19  Yes [provider]  COVID-19 mRNA Vac-TriS, Pfizer, SUSP injection Inject into the muscle. 09/03/20  Yes Carlyle Basques, MD  gabapentin (NEURONTIN) 100 MG capsule Take 1 capsule (100 mg total) by mouth 2 (two) times daily. 11/13/20  Yes Keyani Rigdon, Ines Bloomer, MD  glucose blood (FREESTYLE LITE) test strip Use as instructed 06/23/20  Yes Renato Shin, MD  glucose blood test strip USE TO CHECK BLOOD SUGAR TWICE A DAY 05/28/20 05/28/21 Yes Renato Shin, MD  Insulin Glargine [facility]) 100 UNIT/ML Inject 40 Units into the skin every morning. 09/03/20  Yes Renato Shin, MD  Insulin Pen Needle (UNIFINE PENTIPS) 31G X 8 MM MISC USE WITH INSULIN TWICE DAILY AS  DIRECTED 07/10/20 07/10/21 Yes Renato Shin, MD  Lancets (FREESTYLE) lancets USE TO CHECK BLOOD SUGAR 2 TIMES PER DAY. 11/02/17  Yes Renato Shin, MD  losartan (COZAAR) 100 MG tablet Take 1 tablet (100 mg total) by mouth daily. 11/30/17  Yes Wendie Agreste, MD  metFORMIN (GLUCOPHAGE-XR) 500 MG 24 hr tablet Take 2 tablets (1,000 mg total) by mouth daily with breakfast. 10/14/20  Yes Renato Shin, MD  neomycin-bacitracin-polymyxin (NEOSPORIN) ointment Apply 1 application topically every 12 (twelve) hours. 09/20/19  Yes Rosemarie Ax, MD  omeprazole (PRILOSEC) 20 MG capsule Take 1 capsule (20 mg total) by mouth every morning. 09/02/20 09/02/21 Yes Zamariyah Furukawa, Ines Bloomer, MD  pramipexole (MIRAPEX) 0.5 MG tablet Take 1 tablet (0.5 mg total) by mouth 3 (three) times daily. 11/28/19  Yes Sater, Nanine Means, MD  Semaglutide, 1 MG/DOSE, (OZEMPIC, 1 MG/DOSE,) 2 MG/1.5ML SOPN Inject 1 mg into the skin once a week. 01/18/20  Yes Renato Shin, MD  Semaglutide, 1 MG/DOSE, (OZEMPIC, 1 MG/DOSE,) 4 MG/3ML SOPN INJECT 1 MG INTO THE SKIN ONCE A WEEK. 10/21/20 10/21/21 Yes Renato Shin, MD  UNIFINE PENTIPS 31G X 8 MM MISC USE WITH INSULIN TWICE DAILY AS DIRECTED 09/13/19  Yes [provider]  fluticasone (FLONASE) 50 MCG/ACT nasal spray Place 1-2 sprays into both nostrils daily for 7 days. 08/29/19 09/05/19  Wieters, Hallie C, PA-C  albuterol (PROVENTIL HFA;VENTOLIN HFA) 108 (90 Base) MCG/ACT inhaler Inhale 1-2 puffs into the lungs every 4 (four) hours as needed  for wheezing or shortness of breath. 12/24/17 08/29/19  Wendie Agreste, MD    Allergies  Allergen Reactions   Insulin Glargine Hives    specifically Semglee    Patient Active Problem List   Diagnosis Date Noted   Right flank pain 11/10/2020   Hypertension associated with diabetes ([place]) 11/10/2020   Diplopia 11/28/2019   Restless leg 11/28/2019   Right abducens nerve palsy 11/27/2019   Left leg pain 09/24/2019   Diabetes ([place]) 05/01/2015    Past  Medical History:  Diagnosis Date   Constipation    uses OTC laxatives - hard stools every morning- small amount and feels like doesnt empty    Diabetes mellitus without complication ([place])    DM type 2 (diabetes mellitus, type 2) (HCC)    Hyperlipidemia    Hypertension    Leg cramps    Tuberculin skin test (TST) positive    Tuberculosis    pt states was treated for TB    Past Surgical History:  Procedure Laterality Date   arm surgery     COLONOSCOPY     ELBOW SURGERY     FOOT SURGERY Bilateral    POLYPECTOMY      Social History   Socioeconomic History   Marital status: Married    Spouse name: Not on file   Number of children: 2   Years of education: Not on file   Highest education level: Not on file  Occupational History   Occupation: Floor Tech at [employer]   Tobacco Use   Smoking status: Former    Packs/day: 0.25    Years: 5.00    Pack years: 1.25    Types: Cigarettes    Quit date: 06/04/1978    Years since quitting: 42.5   Smokeless tobacco: Never  Vaping Use   Vaping Use: Never used  Substance and Sexual Activity   Alcohol use: No   Drug use: No   Sexual activity: Not on file  Other Topics Concern   Not on file  Social History Narrative   Lives with family       Social Determinants of Health   Financial Resource Strain: Not on file  Food Insecurity: Not on file  Transportation Needs: Not on file  Physical Activity: Not on file  Stress: Not on file  Social Connections: Not on file  Intimate Partner Violence: Not on file    Family History  Problem Relation Age of Onset   Diabetes Father    Colon cancer Neg Hx    Colon polyps Neg Hx    Rectal cancer Neg Hx    Stomach cancer Neg Hx      Review of Systems  Constitutional: Negative.  Negative for chills and fever.  HENT: Negative.  Negative for congestion and sore throat.   Respiratory: Negative.  Negative for cough and shortness of breath.   Cardiovascular:  Negative for chest pain and  palpitations.  Gastrointestinal:  Negative for abdominal pain, nausea and vomiting.  Genitourinary:  Negative for dysuria and hematuria.  Musculoskeletal:  Positive for back pain.  Skin: Negative.  Negative for rash.  Neurological: Negative.  Negative for dizziness and headaches.  All other systems reviewed and are negative. Today's Vitals   12/01/20 1328  BP: (!) 162/80  Pulse: 71  SpO2: 97%  Weight: 264 lb (119.7 kg)  Height: '6\' 2"'  (1.88 m)   Body mass index is 33.9 kg/m.   Physical Exam Vitals reviewed.  Constitutional:  Appearance: Normal appearance. He is obese.  HENT:     Head: Normocephalic.  Eyes:     Extraocular Movements: Extraocular movements intact.     Pupils: Pupils are equal, round, and reactive to light.  Cardiovascular:     Rate and Rhythm: Normal rate and regular rhythm.     Pulses: Normal pulses.     Heart sounds: Normal heart sounds.  Pulmonary:     Effort: Pulmonary effort is normal.     Breath sounds: Normal breath sounds.  Abdominal:     Tenderness: There is no abdominal tenderness.  Musculoskeletal:        General: Normal range of motion.     Cervical back: Normal range of motion and neck supple.  Skin:    General: Skin is warm and dry.     Capillary Refill: Capillary refill takes less than 2 seconds.  Neurological:     General: No focal deficit present.     Mental Status: He is alert and oriented to person, place, and time.  Psychiatric:        Mood and Affect: Mood normal.        Behavior: Behavior normal.     ASSESSMENT & PLAN: Problems addressed today: Atherosclerosis of aorta (HCC) Diet and nutrition discussed.  Continue atorvastatin 20 mg daily. The 10-year ASCVD risk score (Arnett DK, et al., 2019) is: 22.7%   Values used to calculate the score:     Age: 57 years     Sex: Male     Is Non-Hispanic African American: No     Diabetic: Yes     Tobacco smoker: No     Systolic Blood Pressure: 944 mmHg     Is BP treated: Yes      HDL Cholesterol: 51 mg/dL     Total Cholesterol: 153 mg/dL   Hypertension associated with diabetes ([place]) Well-controlled hypertension.  Continue losartan 100 mg daily and amlodipine 5 mg daily. BP Readings from Last 3 Encounters:  12/01/20 130/70  11/10/20 130/70  09/01/20 (!) 160/88     Calculus of gallbladder without cholecystitis without obstruction Diet and nutrition discussed.  Recommended low-fat moderate carbohydrate diet. Mostly asymptomatic.  No fatty food intolerance or pain associated with ingestion of fatty foods.  Incidental finding most likely and not the cause of his chronic pain.  Diverticulosis With history of chronic constipation.  May try Linzess as needed. High-fiber diet recommended.  Musculoskeletal back pain Needs orthopedic evaluation.  May need MRI of lumbar spine.  May benefit from physical therapy.  Merrel was seen today for results.  Diagnoses and all orders for this visit:  Musculoskeletal back pain -     Ambulatory referral to Orthopedic Surgery  Lumbar pain -     Ambulatory referral to Orthopedic Surgery  Atherosclerosis of aorta [facility])  Diverticulosis  Calculus of gallbladder without cholecystitis without obstruction  Paraumbilical hernia  Chronic constipation -     linaclotide (LINZESS) 145 MCG CAPS capsule; Take 1 capsule (145 mcg total) by mouth daily before breakfast.  Patient Instructions  Dolor de espalda agudo en los adultos Acute Back Pain, Adult El dolor de espalda agudo es repentino y por lo general no dura mucho tiempo. Se debe generalmente a una lesin de los msculos y tejidos de la espalda. La lesin puede ser el resultado de: Estiramiento en exceso o desgarro de un msculo, tendn o ligamento. Los ligamentos son tejidos que [employer]. Levantar algo de forma incorrecta puede producir un esguince  de espalda. Desgaste (degeneracin) de los discos vertebrales. Los discos vertebrales son tejidos circulares que  proporcionan amortiguacin entre los huesos de la columna vertebral (vrtebras). Movimientos de giro, como al practicar deportes o realizar trabajos de [place]. Un golpe en la espalda. Artritis. Es posible Hydrologist un examen fsico, anlisis de laboratorio u otros estudios de diagnstico por imgenes para Animator causa del Social research officer, government. El dolor de espalda agudo generalmente desaparece con reposo y cuidados en la casa. Siga estas instrucciones en su casa: Control del dolor, la rigidez y Forensic psychologist los medicamentos de venta libre y los recetados solamente como se lo haya indicado el mdico. El tratamiento puede incluir medicamentos para Conservation officer, historic buildings y la inflamacin que se toman por la boca o que se aplican sobre la piel, o relajantes musculares. El mdico puede recomendarle que se aplique hielo durante las primeras 24 a 23 horas despus del comienzo del Social research officer, government. Para hacer esto: Ponga el hielo en una bolsa plstica. Coloque una toalla entre la piel y Therapist, nutritional. Aplique el hielo durante 20 minutos, 2 o 3 veces por da. Retire el hielo si la piel se pone de color rojo brillante. Esto es [employer]. Si no puede sentir dolor, calor o fro, tiene un mayor riesgo de que se dae la zona. Si se lo indican, aplique calor en la zona afectada con la frecuencia que le haya indicado el mdico. Use la fuente de calor que el mdico le recomiende, como una compresa de calor hmedo o una almohadilla trmica. Coloque una toalla entre la piel y la fuente de Freight forwarder. Aplique calor durante 20 a 30 minutos. Retire la fuente de calor si la piel se pone de color rojo brillante. Esto es especialmente importante si no puede sentir dolor, calor o fro. Corre un mayor riesgo de sufrir quemaduras. Actividad  No permanezca en la cama. Hacer reposo en la cama por ms de 1 a 2 das puede demorar su recuperacin. Mantenga una buena postura al sentarse y pararse. No se incline hacia adelante al sentarse ni se encorve al  pararse. Si trabaja en un escritorio, sintese cerca de este para no tener que inclinarse. Mantenga el mentn hacia abajo. Mantenga el cuello hacia atrs y los codos flexionados en un ngulo de 90 grados (ngulo recto). Cuando conduzca, sintese elevado y cerca del volante. Agregue un apoyo para la espalda (lumbar) al asiento del automvil, si es necesario. Realice caminatas cortas en superficies planas tan pronto como le sea posible. Trate de caminar un poco ms de Publishing copy. No se siente, conduzca o permanezca de pie en un mismo lugar durante ms de 30 minutos seguidos. Pararse o sentarse durante largos perodos de Radiographer, therapeutic la espalda. No conduzca ni use maquinaria pesada mientras toma analgsicos recetados. Use tcnicas apropiadas para levantar objetos. Cuando se inclina y Chief Executive Officer un [place], utilice posiciones que no sobrecarguen tanto la espalda: [place]. Mantenga la carga cerca del cuerpo. No se tuerza. Haga actividad fsica habitualmente como se lo haya indicado el mdico. Hacer ejercicios ayuda a que la espalda sane ms rpido y [place] a Product/process development scientist las lesiones de la espalda al [employer] msculos fuertes y flexibles. Trabaje con un fisioterapeuta para crear un programa de ejercicios seguros, segn lo recomiende el mdico. Haga ejercicios como se lo haya indicado el fisioterapeuta. Estilo de vida Mantenga un peso saludable. El sobrepeso sobrecarga la espalda y hace que resulte difcil tener una buena [place]. Evite actividades o situaciones que  lo hagan sentirse ansioso o estresado. El estrs y la ansiedad aumentan la tensin muscular y pueden empeorar el dolor de espalda. Aprenda formas de [employer] ansiedad y [place], como a travs del ejercicio. Instrucciones generales [place] sobre un colchn firme en una posicin cmoda. Intente acostarse de costado, con las rodillas ligeramente flexionadas. Si se recuesta [employer], coloque una almohada debajo de  las rodillas. Mantenga la cabeza y el cuello en lnea recta con la columna vertebral (posicin neutra) cuando use equipos electrnicos como telfonos inteligentes o tablets. Para hacer esto: Levante el telfono inteligente o la tablet para Consulting civil engineer de inclinar la cabeza o el cuello para mirar hacia abajo. Coloque el telfono inteligente o la tablet al nivel de su cara mientras mira la pantalla. Siga el plan de tratamiento como se lo haya indicado el mdico. Esto puede incluir: Terapia cognitiva o conductual. Acupuntura o terapia de masajes. Yoga o meditacin. Comunquese con un mdico si: Siente un dolor que no se alivia con reposo o medicamentos. Siente mucho dolor que se extiende a las piernas o las nalgas. El dolor no mejora luego de 2 semanas. Siente dolor por la noche. Pierde peso sin proponrselo. Tiene fiebre o escalofros. Siente nuseas o vmitos. Siente dolor abdominal. Solicite ayuda de inmediato si: Tiene nuevos problemas para controlar la vejiga o los intestinos. Siente debilidad o adormecimiento inusuales en los brazos o en las piernas. Siente que va a desmayarse. Estos sntomas pueden representar un problema grave que constituye Engineer, maintenance (IT). No espere a ver si los sntomas desaparecen. Solicite atencin mdica de inmediato. Comunquese con el servicio de emergencias de su localidad (911 en los [place]). No conduzca por sus propios medios [employer]. Resumen El dolor de espalda agudo es repentino y por lo general no dura mucho tiempo. Use tcnicas apropiadas para levantar objetos. Cuando se inclina y levanta un [place], utilice posiciones que no sobrecarguen tanto la espalda. Tome los medicamentos de venta libre y los recetados solamente como se lo haya indicado el mdico, y aplquese calor o hielo segn las indicaciones. Esta informacin no tiene Marine scientist el consejo del mdico. Asegrese de hacerle al mdico cualquier pregunta que  tenga. Document Revised: 06/18/2020 Document Reviewed: 06/18/2020 Elsevier Patient Education  2022 [place], MD [facility]

## 2020-12-01 NOTE — Patient Instructions (Signed)
Dolor de espalda agudo en los adultos Acute Back Pain, Adult El dolor de espalda agudo es repentino y por lo general no dura mucho tiempo. Se debe generalmente a una lesin de los msculos y tejidos de la espalda. La lesin puede ser el resultado de: Estiramiento en exceso o desgarro de un msculo, tendn o ligamento. Los ligamentos son tejidos que conectan los huesos. Levantar algo de forma incorrecta puede producir un esguince de espalda. Desgaste (degeneracin) de los discos vertebrales. Los discos vertebrales son tejidos circulares que proporcionan amortiguacin entre los huesos de la columna vertebral (vrtebras). Movimientos de giro, como al practicar deportes o realizar trabajos de jardinera. Un golpe en la espalda. Artritis. Es posible que le realicen un examen fsico, anlisis de laboratorio u otros estudios de diagnstico por imgenes para encontrar la causa del dolor. El dolor de espalda agudo generalmente desaparece con reposo y cuidados en la casa. Siga estas instrucciones en su casa: Control del dolor, la rigidez y la hinchazn Use los medicamentos de venta libre y los recetados solamente como se lo haya indicado el mdico. El tratamiento puede incluir medicamentos para el dolor y la inflamacin que se toman por la boca o que se aplican sobre la piel, o relajantes musculares. El mdico puede recomendarle que se aplique hielo durante las primeras 24 a 48 horas despus del comienzo del dolor. Para hacer esto: Ponga el hielo en una bolsa plstica. Coloque una toalla entre la piel y la bolsa. Aplique el hielo durante 20 minutos, 2 o 3 veces por da. Retire el hielo si la piel se pone de color rojo brillante. Esto es muy importante. Si no puede sentir dolor, calor o fro, tiene un mayor riesgo de que se dae la zona. Si se lo indican, aplique calor en la zona afectada con la frecuencia que le haya indicado el mdico. Use la fuente de calor que el mdico le recomiende, como una compresa de  calor hmedo o una almohadilla trmica. Coloque una toalla entre la piel y la fuente de calor. Aplique calor durante 20 a 30 minutos. Retire la fuente de calor si la piel se pone de color rojo brillante. Esto es especialmente importante si no puede sentir dolor, calor o fro. Corre un mayor riesgo de sufrir quemaduras. Actividad  No permanezca en la cama. Hacer reposo en la cama por ms de 1 a 2 das puede demorar su recuperacin. Mantenga una buena postura al sentarse y pararse. No se incline hacia adelante al sentarse ni se encorve al pararse. Si trabaja en un escritorio, sintese cerca de este para no tener que inclinarse. Mantenga el mentn hacia abajo. Mantenga el cuello hacia atrs y los codos flexionados en un ngulo de 90 grados (ngulo recto). Cuando conduzca, sintese elevado y cerca del volante. Agregue un apoyo para la espalda (lumbar) al asiento del automvil, si es necesario. Realice caminatas cortas en superficies planas tan pronto como le sea posible. Trate de caminar un poco ms de tiempo cada da. No se siente, conduzca o permanezca de pie en un mismo lugar durante ms de 30 minutos seguidos. Pararse o sentarse durante largos perodos de tiempo puede sobrecargar la espalda. No conduzca ni use maquinaria pesada mientras toma analgsicos recetados. Use tcnicas apropiadas para levantar objetos. Cuando se inclina y levanta un objeto, utilice posiciones que no sobrecarguen tanto la espalda: Flexione las rodillas. Mantenga la carga cerca del cuerpo. No se tuerza. Haga actividad fsica habitualmente como se lo haya indicado el mdico. Hacer ejercicios ayuda   a que la espalda sane ms rpido y [place] a Product/process development scientist las lesiones de la espalda al [employer] msculos fuertes y flexibles. Trabaje con un fisioterapeuta para crear un programa de ejercicios seguros, segn lo recomiende el mdico. Haga ejercicios como se lo haya indicado el fisioterapeuta. Estilo de vida Mantenga un peso saludable.  El sobrepeso sobrecarga la espalda y hace que resulte difcil tener una buena [place]. Evite actividades o situaciones que lo hagan sentirse ansioso o estresado. El estrs y la ansiedad aumentan la tensin muscular y pueden empeorar el dolor de espalda. Aprenda formas de [employer] ansiedad y [place], como a travs del ejercicio. Instrucciones generales [place] sobre un colchn firme en una posicin cmoda. Intente acostarse de costado, con las rodillas ligeramente flexionadas. Si se recuesta [employer], coloque una almohada debajo de las rodillas. Mantenga la cabeza y el cuello en lnea recta con la columna vertebral (posicin neutra) cuando use equipos electrnicos como telfonos inteligentes o tablets. Para hacer esto: [place] el telfono inteligente o la tablet para Consulting civil engineer de inclinar la cabeza o el cuello para mirar hacia abajo. Coloque el telfono inteligente o la tablet al nivel de su cara mientras mira la pantalla. Siga el plan de tratamiento como se lo haya indicado el mdico. Esto puede incluir: Terapia cognitiva o conductual. Acupuntura o terapia de masajes. Yoga o meditacin. Comunquese con un mdico si: Siente un dolor que no se alivia con reposo o medicamentos. Siente mucho dolor que se extiende a las piernas o las nalgas. El dolor no mejora luego de 2 semanas. Siente dolor por la noche. Pierde peso sin proponrselo. Tiene fiebre o escalofros. Siente nuseas o vmitos. Siente dolor abdominal. Solicite ayuda de inmediato si: Tiene nuevos problemas para controlar la vejiga o los intestinos. Siente debilidad o adormecimiento inusuales en los brazos o en las piernas. Siente que va a desmayarse. Estos sntomas pueden representar un problema grave que constituye Engineer, maintenance (IT). No espere a ver si los sntomas desaparecen. Solicite atencin mdica de inmediato. Comunquese con el servicio de emergencias de su localidad (911 en los [place]). No conduzca por sus  propios medios [employer]. Resumen El dolor de espalda agudo es repentino y por lo general no dura mucho tiempo. Use tcnicas apropiadas para levantar objetos. Cuando se inclina y levanta un [place], utilice posiciones que no sobrecarguen tanto la espalda. Tome los medicamentos de venta libre y los recetados solamente como se lo haya indicado el mdico, y aplquese calor o hielo segn las indicaciones. Esta informacin no tiene Marine scientist el consejo del mdico. Asegrese de hacerle al mdico cualquier pregunta que tenga. Document Revised: 06/18/2020 Document Reviewed: 06/18/2020 Elsevier Patient Education  [place].

## 2020-12-01 NOTE — Assessment & Plan Note (Signed)
Diet and nutrition discussed.  Continue atorvastatin 20 mg daily. The 10-year ASCVD risk score (Arnett DK, et al., 2019) is: 22.7%   Values used to calculate the score:     Age: 66 years     Sex: Male     Is Non-Hispanic African American: No     Diabetic: Yes     Tobacco smoker: No     Systolic Blood Pressure: [id number]     Is BP treated: Yes     HDL Cholesterol: 51 mg/dL     Total Cholesterol: 153 mg/dL

## 2020-12-01 NOTE — Assessment & Plan Note (Signed)
Diet and nutrition discussed.  Recommended low-fat moderate carbohydrate diet. Mostly asymptomatic.  No fatty food intolerance or pain associated with ingestion of fatty foods.  Incidental finding most likely and not the cause of his chronic pain.

## 2020-12-02 ENCOUNTER — Other Ambulatory Visit (HOSPITAL_COMMUNITY): Payer: Self-pay

## 2020-12-02 ENCOUNTER — Telehealth: Payer: Self-pay

## 2020-12-02 NOTE — Telephone Encounter (Signed)
Patient says provider released him back to work for "light duty" but his job sent him home bc they dont have any light duty work  Says provider needs to extend patients time out of work until he is able to perform all duties   Patient also says rx linaclotide (LINZESS) 145 MCG CAPS capsule sent to pharmacy yesterday has a high copay & he is unable to pay.. would like to know if there are some cheaper alternatives

## 2020-12-02 NOTE — Telephone Encounter (Signed)
Forms received via E-fax by Erick Colace.  Isac Caddy CMA, has placed the form inside Dr. Mitchel Honour E-Folder to be printed by his assistance.   Type of form received E-Fax Form placed in Dr. Mitchel Honour E-Folder  Don't forget to attach a charge sheet.

## 2020-12-03 ENCOUNTER — Ambulatory Visit: Payer: Self-pay

## 2020-12-03 ENCOUNTER — Other Ambulatory Visit: Payer: Self-pay

## 2020-12-03 ENCOUNTER — Ambulatory Visit (INDEPENDENT_AMBULATORY_CARE_PROVIDER_SITE_OTHER): Payer: 59 | Admitting: Surgery

## 2020-12-03 ENCOUNTER — Encounter: Payer: Self-pay | Admitting: Surgery

## 2020-12-03 VITALS — BP 145/86 | HR 66 | Ht 74.0 in | Wt 264.0 lb

## 2020-12-03 DIAGNOSIS — M545 Low back pain, unspecified: Secondary | ICD-10-CM | POA: Diagnosis not present

## 2020-12-03 DIAGNOSIS — M4726 Other spondylosis with radiculopathy, lumbar region: Secondary | ICD-10-CM

## 2020-12-03 NOTE — Progress Notes (Signed)
Office Visit Note   Patient: James Garcia           Date of Birth: 1956/02/23           MRN: [id number] Visit Date: 12/03/2020              Requested by: Horald Pollen, [place],  [place] [address] PCP: Horald Pollen, MD   Assessment & Plan: Visit Diagnoses:  1. Acute right-sided low back pain without sciatica   2. Other spondylosis with radiculopathy, lumbar region     Plan: Discussed lumbar spine x-rays with patient today.  He has had this chronic right flank/back pain for at least a year.  I am not quite sure why his PCP did not order a lumbar MRI despite the fact that this was mentioned in his note as an option.  I will go ahead and order this study and he can follow-up with Dr. Lorin Mercy in 3 weeks to review and discuss treatment options.  Follow-Up Instructions: Return in about 3 weeks (around 12/24/2020) for With Dr. Lorin Mercy to review lumbar MRI.   Orders:  Orders Placed This Encounter  Procedures   XR Lumbar Spine 2-3 Views   MR Lumbar Spine w/o contrast   No orders of the defined types were placed in this encounter.     Procedures: No procedures performed   Clinical Data: No additional findings.   Subjective: Chief Complaint  Patient presents with   Lower Back - Pain    HPI 65 year old male who is new patient clinic comes in with complaints of chronic back pain.  He has been seen at request of his primary care physician Dr. Agustina Caroli.  I reviewed patient's chart and he started seeing his PCP for this about a year ago.  He did order x-rays December 04, 2018 one of the lumbar spine and that report showed:  CLINICAL DATA:  Right lumbar pain   EXAM: LUMBAR SPINE - 2-3 VIEW   COMPARISON:  01/07/2006   FINDINGS: There is no evidence of lumbar spine fracture. Alignment is normal. Intervertebral disc spaces are maintained. Small anterior endplate spurs at all lumbar levels.   IMPRESSION: Negative.      Electronically Signed   By: Lucrezia Europe M.D.   On: 12/04/2019 11:31  He recently had CT renal stone study again ordered by his PCP and this was done November 27, 2020 and that report showed: IMPRESSION: 1. No acute or focal lesion to explain the patient's symptoms. 2. Cholelithiasis without cholecystitis. 3. Sigmoid diverticulosis without diverticulitis. 4. Paraumbilical hernia contains fat without bowel. 5. Aortic Atherosclerosis (ICD10-I70.0).     Electronically Signed   By: San Morelle M.D.   On: 11/28/2020 09:17  He was seen by his primary care provider December 01, 2020 and he did mention that patient may needing the MRI of the lumbar spine and may benefit from physical therapy but I do not see where either one was ordered.  Increased chronic low back pain worsening for about a year.  Pain most on the right side and radiates to the waist.  Hurts to walk and stand.  Also vomiting with prolonged sitting and twisting movements.  Admits to having some pain down into his feet when walking.  Describes neurogenic claudication symptoms when grocery shopping where he has to lean over a cart.  Review of Systems No current cardiac pulm issues.   Objective: Vital Signs: BP (!) 145/86   Pulse  66   Ht 6\' 2"  (1.88 m)   Wt 264 lb (119.7 kg)   BMI 33.90 kg/m   Physical Exam HENT:     Head: Normocephalic.  Eyes:     Extraocular Movements: Extraocular movements intact.  Pulmonary:     Effort: No respiratory distress.  Musculoskeletal:     Comments: Gait is antalgic.  Positive bilateral lumbar paraspinal tenderness.  Positive sciatic notch tenderness.  Positive bilateral straight leg raise.  No focal motor deficits.  Negative logroll bilateral hips.  Neurological:     General: No focal deficit present.     Mental Status: He is alert and oriented to person, place, and time.    Ortho Exam  Specialty Comments:  No specialty comments available.  Imaging: No results  found.   PMFS History: Patient Active Problem List   Diagnosis Date Noted   Paraumbilical hernia 35/57/3220   Calculus of gallbladder without cholecystitis without obstruction 12/01/2020   Diverticulosis 12/01/2020   Atherosclerosis of aorta ([place]) 12/01/2020   Musculoskeletal back pain 12/01/2020   Right flank pain 11/10/2020   Hypertension associated with diabetes ([place]) 11/10/2020   Diplopia 11/28/2019   Restless leg 11/28/2019   Right abducens nerve palsy 11/27/2019   Left leg pain 09/24/2019   Diabetes ([place]) 05/01/2015   Past Medical History:  Diagnosis Date   Constipation    uses OTC laxatives - hard stools every morning- small amount and feels like doesnt empty    Diabetes mellitus without complication ([place])    DM type 2 (diabetes mellitus, type 2) (HCC)    Hyperlipidemia    Hypertension    Leg cramps    Tuberculin skin test (TST) positive    Tuberculosis    pt states was treated for TB    Family History  Problem Relation Age of Onset   Diabetes Father    Colon cancer Neg Hx    Colon polyps Neg Hx    Rectal cancer Neg Hx    Stomach cancer Neg Hx     Past Surgical History:  Procedure Laterality Date   arm surgery     COLONOSCOPY     ELBOW SURGERY     FOOT SURGERY Bilateral    POLYPECTOMY     Social History   Occupational History   Occupation: Engineer, maintenance (IT) at [employer]   Tobacco Use   Smoking status: Former    Packs/day: 0.25    Years: 5.00    Pack years: 1.25    Types: Cigarettes    Quit date: 06/04/1978    Years since quitting: 42.5   Smokeless tobacco: Never  Vaping Use   Vaping Use: Never used  Substance and Sexual Activity   Alcohol use: No   Drug use: No   Sexual activity: Not on file

## 2020-12-03 NOTE — Telephone Encounter (Signed)
Patient states he needs form completed by 9.27.22   Very important that [employer] approve, because he is concerned about losing his job   Form needs to be faxed to [employer] Case Worker: Perfecto Kingdom

## 2020-12-05 ENCOUNTER — Other Ambulatory Visit (HOSPITAL_COMMUNITY): Payer: Self-pay

## 2020-12-05 ENCOUNTER — Encounter: Payer: Self-pay | Admitting: Internal Medicine

## 2020-12-05 NOTE — Telephone Encounter (Signed)
Forms completed and signed by Dr. Mitchel Honour. Forms faxed, received confirmation.

## 2020-12-08 ENCOUNTER — Ambulatory Visit (INDEPENDENT_AMBULATORY_CARE_PROVIDER_SITE_OTHER): Payer: 59 | Admitting: Endocrinology

## 2020-12-08 ENCOUNTER — Other Ambulatory Visit (HOSPITAL_COMMUNITY): Payer: Self-pay

## 2020-12-08 ENCOUNTER — Other Ambulatory Visit: Payer: Self-pay

## 2020-12-08 VITALS — BP 118/60 | HR 66 | Ht 74.0 in | Wt 263.8 lb

## 2020-12-08 DIAGNOSIS — Z794 Long term (current) use of insulin: Secondary | ICD-10-CM | POA: Diagnosis not present

## 2020-12-08 DIAGNOSIS — E1141 Type 2 diabetes mellitus with diabetic mononeuropathy: Secondary | ICD-10-CM | POA: Diagnosis not present

## 2020-12-08 LAB — POCT GLYCOSYLATED HEMOGLOBIN (HGB A1C): Hemoglobin A1C: 7.5 % — AB (ref 4.0–5.6)

## 2020-12-08 MED ORDER — OZEMPIC (2 MG/DOSE) 8 MG/3ML ~~LOC~~ SOPN
2.0000 mg | PEN_INJECTOR | SUBCUTANEOUS | 3 refills | Status: DC
  Filled 2020-12-08 – 2020-12-17 (×2): qty 3, 28d supply, fill #0
  Filled 2021-01-13: qty 3, 28d supply, fill #1
  Filled 2021-02-10: qty 3, 28d supply, fill #2
  Filled 2021-03-24: qty 3, 28d supply, fill #3
  Filled 2021-03-25: qty 9, 84d supply, fill #3
  Filled 2021-05-11 – 2021-06-24 (×2): qty 9, 84d supply, fill #4
  Filled 2021-08-26: qty 9, 84d supply, fill #5

## 2020-12-08 MED ORDER — BASAGLAR KWIKPEN 100 UNIT/ML ~~LOC~~ SOPN
30.0000 [IU] | PEN_INJECTOR | SUBCUTANEOUS | 3 refills | Status: DC
  Filled 2020-12-08: qty 30, 100d supply, fill #0

## 2020-12-08 NOTE — Progress Notes (Signed)
Subjective:    Patient ID: James Garcia, male    DOB: 07-24-1955, 65 y.o.   MRN: [id number]  HPI Pt returns for f/u of diabetes mellitus:  DM type: Insulin-requiring type 2 Dx'ed: 3151 Complications: PN and DR.   Therapy: insulin since 2016, Ozempic, and metformin.   DKA: never Severe hypoglycemia: never.   Pancreatitis: never.   SDOH: he works 2nd shift. Other: he declines multiple daily injections; He stopped farxiga, due to itching.   Interval history: no cbg record, but states cbg's vary from 90-250.  Since the dosage reductions, he still has mild hypoglycemia approx once per month.   Past Medical History:  Diagnosis Date   Constipation    uses OTC laxatives - hard stools every morning- small amount and feels like doesnt empty    Diabetes mellitus without complication (HCC)    DM type 2 (diabetes mellitus, type 2) (HCC)    Hyperlipidemia    Hypertension    Leg cramps    Tuberculin skin test (TST) positive    Tuberculosis    pt states was treated for TB    Past Surgical History:  Procedure Laterality Date   arm surgery     COLONOSCOPY     ELBOW SURGERY     FOOT SURGERY Bilateral    POLYPECTOMY      Social History   Socioeconomic History   Marital status: Married    Spouse name: Not on file   Number of children: 2   Years of education: Not on file   Highest education level: Not on file  Occupational History   Occupation: Floor Tech at [employer]   Tobacco Use   Smoking status: Former    Packs/day: 0.25    Years: 5.00    Pack years: 1.25    Types: Cigarettes    Quit date: 06/04/1978    Years since quitting: 42.5   Smokeless tobacco: Never  Vaping Use   Vaping Use: Never used  Substance and Sexual Activity   Alcohol use: No   Drug use: No   Sexual activity: Not on file  Other Topics Concern   Not on file  Social History Narrative   Lives with family       Social Determinants of Health   Financial Resource Strain: Not on file  Food Insecurity: Not  on file  Transportation Needs: Not on file  Physical Activity: Not on file  Stress: Not on file  Social Connections: Not on file  Intimate Partner Violence: Not on file    Current Outpatient Medications on File Prior to Visit  Medication Sig Dispense Refill   amLODipine (NORVASC) 5 MG tablet TAKE 1 TABLET BY MOUTH ONCE A DAY 90 tablet 3   atorvastatin (LIPITOR) 20 MG tablet TAKE 1 TABLET BY MOUTH ONCE DAILY 90 tablet 3   Blood Glucose Monitoring Suppl (FREESTYLE LITE) DEVI 1 kit by Does not apply route daily. Use to test blood sugars daily 1 each 0   Blood Pressure Monitor KIT Use as instructed for HTN 1 each 0   cetirizine (ZYRTEC) 10 MG tablet Take 10 mg by mouth daily.     COVID-19 mRNA Vac-TriS, Pfizer, SUSP injection Inject into the muscle. 0.3 mL 0   gabapentin (NEURONTIN) 100 MG capsule Take 1 capsule (100 mg total) by mouth 2 (two) times daily. 180 capsule 0   glucose blood (FREESTYLE LITE) test strip Use as instructed 200 each 12   glucose blood test strip USE TO  CHECK BLOOD SUGAR TWICE A DAY 50 strip 0   Insulin Pen Needle (UNIFINE PENTIPS) 31G X 8 MM MISC USE WITH INSULIN TWICE DAILY AS DIRECTED 100 each 3   Lancets (FREESTYLE) lancets USE TO CHECK BLOOD SUGAR 2 TIMES PER DAY. 200 each 2   linaclotide (LINZESS) 145 MCG CAPS capsule Take 1 capsule (145 mcg total) by mouth daily before breakfast. 30 capsule 1   losartan (COZAAR) 100 MG tablet Take 1 tablet (100 mg total) by mouth daily. 90 tablet 0   metFORMIN (GLUCOPHAGE-XR) 500 MG 24 hr tablet Take 2 tablets (1,000 mg total) by mouth daily with breakfast. 180 tablet 0   neomycin-bacitracin-polymyxin (NEOSPORIN) ointment Apply 1 application topically every 12 (twelve) hours. 15 g 0   omeprazole (PRILOSEC) 20 MG capsule Take 1 capsule (20 mg total) by mouth every morning. 90 capsule 0   pramipexole (MIRAPEX) 0.5 MG tablet Take 1 tablet (0.5 mg total) by mouth 3 (three) times daily. 30 tablet 5   UNIFINE PENTIPS 31G X 8 MM MISC USE  WITH INSULIN TWICE DAILY AS DIRECTED     fluticasone (FLONASE) 50 MCG/ACT nasal spray Place 1-2 sprays into both nostrils daily for 7 days. 1 g 0   [DISCONTINUED] albuterol (PROVENTIL HFA;VENTOLIN HFA) 108 (90 Base) MCG/ACT inhaler Inhale 1-2 puffs into the lungs every 4 (four) hours as needed for wheezing or shortness of breath. 1 Inhaler 0   Current Facility-Administered Medications on File Prior to Visit  Medication Dose Route Frequency Provider Last Rate Last Admin   0.9 %  sodium chloride infusion  500 mL Intravenous Once Pyrtle, Lajuan Lines, MD        Allergies  Allergen Reactions   Insulin Glargine Hives    specifically Semglee    Family History  Problem Relation Age of Onset   Diabetes Father    Colon cancer Neg Hx    Colon polyps Neg Hx    Rectal cancer Neg Hx    Stomach cancer Neg Hx     BP 118/60 (BP Location: Right Arm, Patient Position: Sitting, Cuff Size: Large)   Pulse 66   Ht '6\' 2"'  (1.88 m)   Wt 263 lb 12.8 oz (119.7 kg)   SpO2 96%   BMI 33.87 kg/m    Review of Systems He denies hypoglycemia/n/v.  HB is well-controlled.      Objective:   Physical Exam Pulses: dorsalis pedis intact bilat.   MSK: no deformity of the feet CV: trace bilat leg edema Skin:  no ulcer on the feet.  normal color and temp on the feet. Neuro: sensation is intact to touch on the feet, but decreased from normal.    Lab Results  Component Value Date   HGBA1C 7.5 (A) 12/08/2020      Assessment & Plan:  Insulin-requiring type 2 DM Hypoglycemia, due to insulin: we'll favor GLP over insulin  Patient Instructions  Please reduce the insulin to 30 units each morning, and:  I have sent a prescription to your pharmacy, to double the [place], and:  Please continue the same metformin.   check your blood sugar twice a day.  vary the time of day when you check, between before the 3 meals, and at bedtime.  also check if you have symptoms of your blood sugar being too high or too low.  please  keep a record of the readings and bring it to your next appointment here (or you can bring the meter itself).  You can write  it on any piece of paper.  please call us sooner if your blood sugar goes below 70, or if you have a lot of readings over 200.   Please come back for a follow-up appointment in January.

## 2020-12-08 NOTE — Patient Instructions (Addendum)
Please reduce the insulin to 30 units each morning, and:  I have sent a prescription to your pharmacy, to double the [place], and:  Please continue the same metformin.   check your blood sugar twice a day.  vary the time of day when you check, between before the 3 meals, and at bedtime.  also check if you have symptoms of your blood sugar being too high or too low.  please keep a record of the readings and bring it to your next appointment here (or you can bring the meter itself).  You can write it on any piece of paper.  please call us sooner if your blood sugar goes below 70, or if you have a lot of readings over 200.   Please come back for a follow-up appointment in January.

## 2020-12-09 ENCOUNTER — Other Ambulatory Visit (HOSPITAL_COMMUNITY): Payer: Self-pay

## 2020-12-10 ENCOUNTER — Other Ambulatory Visit (HOSPITAL_COMMUNITY): Payer: Self-pay

## 2020-12-17 ENCOUNTER — Telehealth: Payer: Self-pay | Admitting: Emergency Medicine

## 2020-12-17 ENCOUNTER — Other Ambulatory Visit: Payer: Self-pay | Admitting: Emergency Medicine

## 2020-12-17 ENCOUNTER — Other Ambulatory Visit (HOSPITAL_COMMUNITY): Payer: Self-pay

## 2020-12-17 NOTE — Telephone Encounter (Signed)
Wants to know if provider has completed FMLA short term disability forms.. please follow up w/ patient

## 2020-12-18 ENCOUNTER — Telehealth: Payer: Self-pay | Admitting: Emergency Medicine

## 2020-12-18 ENCOUNTER — Other Ambulatory Visit: Payer: Self-pay | Admitting: Emergency Medicine

## 2020-12-18 ENCOUNTER — Other Ambulatory Visit (HOSPITAL_COMMUNITY): Payer: Self-pay

## 2020-12-18 ENCOUNTER — Telehealth: Payer: Self-pay

## 2020-12-18 MED ORDER — FREESTYLE LANCETS MISC
2 refills | Status: AC
  Filled 2020-12-18: qty 200, 90d supply, fill #0

## 2020-12-18 NOTE — Telephone Encounter (Signed)
Called and spoke with patient and he states that he need another prescription for diabetic lancets. He states is co pay for the ones he has now is too expensive.

## 2020-12-18 NOTE — Telephone Encounter (Signed)
Prescription sent for lancets to pharmacy of record.  Thanks.

## 2020-12-22 ENCOUNTER — Telehealth: Payer: Self-pay

## 2020-12-22 ENCOUNTER — Other Ambulatory Visit: Payer: 59

## 2020-12-22 NOTE — Telephone Encounter (Signed)
Pt called stating that he didn't get his MRI today since no medication was sent in due to him being claustrophobic.

## 2020-12-23 ENCOUNTER — Other Ambulatory Visit (HOSPITAL_COMMUNITY): Payer: Self-pay

## 2020-12-23 MED ORDER — DIAZEPAM 5 MG PO TABS
10.0000 mg | ORAL_TABLET | ORAL | 0 refills | Status: DC
  Filled 2020-12-23: qty 3, 1d supply, fill #0

## 2020-12-23 NOTE — Telephone Encounter (Signed)
Signed. Thanks.

## 2020-12-23 NOTE — Telephone Encounter (Signed)
Patient aware this was called in for him  

## 2020-12-23 NOTE — Telephone Encounter (Signed)
Called and spoke with pt to let him know forms where completed and place in provider box for signature.

## 2020-12-24 NOTE — Telephone Encounter (Signed)
Faxed FMLA forms.

## 2020-12-25 ENCOUNTER — Other Ambulatory Visit (HOSPITAL_COMMUNITY): Payer: Self-pay

## 2020-12-25 ENCOUNTER — Other Ambulatory Visit: Payer: Self-pay | Admitting: Endocrinology

## 2020-12-25 ENCOUNTER — Telehealth: Payer: Self-pay | Admitting: Endocrinology

## 2020-12-25 MED ORDER — BASAGLAR KWIKPEN 100 UNIT/ML ~~LOC~~ SOPN
40.0000 [IU] | PEN_INJECTOR | SUBCUTANEOUS | 3 refills | Status: DC
  Filled 2020-12-25 – 2021-02-08 (×2): qty 36, 90d supply, fill #0

## 2020-12-25 NOTE — Telephone Encounter (Signed)
Message sent thru MyChart 

## 2020-12-25 NOTE — Telephone Encounter (Signed)
Patient called to advise that for the last 2 weeks his blood sugars have been running high, between 200-250's. States that this started when Is medication change  Patient requests a call back for advice on how to lower his blood sugars - call back # [phone]

## 2020-12-26 ENCOUNTER — Ambulatory Visit: Payer: 59 | Admitting: Orthopaedic Surgery

## 2020-12-29 DIAGNOSIS — Z760 Encounter for issue of repeat prescription: Secondary | ICD-10-CM | POA: Diagnosis not present

## 2021-01-07 ENCOUNTER — Ambulatory Visit
Admission: RE | Admit: 2021-01-07 | Discharge: 2021-01-07 | Disposition: A | Discharge status: Home / Self Care | Payer: 59 | Source: Ambulatory Visit | Attending: Surgery | Admitting: Surgery

## 2021-01-07 ENCOUNTER — Other Ambulatory Visit: Payer: Self-pay

## 2021-01-07 DIAGNOSIS — M4726 Other spondylosis with radiculopathy, lumbar region: Secondary | ICD-10-CM

## 2021-01-07 DIAGNOSIS — M5126 Other intervertebral disc displacement, lumbar region: Secondary | ICD-10-CM | POA: Diagnosis not present

## 2021-01-07 DIAGNOSIS — M47816 Spondylosis without myelopathy or radiculopathy, lumbar region: Secondary | ICD-10-CM | POA: Diagnosis not present

## 2021-01-07 DIAGNOSIS — M545 Low back pain, unspecified: Secondary | ICD-10-CM

## 2021-01-08 ENCOUNTER — Telehealth: Payer: Self-pay | Admitting: Endocrinology

## 2021-01-08 NOTE — Telephone Encounter (Signed)
Patient requests to be called at ph# [phone] re: Patient states he has been having high blood sugars for at least 1 week. Last week blood sugars were over 300. Patient states today blood sugars are 220.

## 2021-01-08 NOTE — Telephone Encounter (Signed)
Message sent thru MyChart 

## 2021-01-13 ENCOUNTER — Other Ambulatory Visit (HOSPITAL_COMMUNITY): Payer: Self-pay

## 2021-01-13 ENCOUNTER — Other Ambulatory Visit: Payer: Self-pay | Admitting: Endocrinology

## 2021-01-13 ENCOUNTER — Telehealth: Payer: Self-pay | Admitting: Emergency Medicine

## 2021-01-13 MED ORDER — METFORMIN HCL ER 500 MG PO TB24
1000.0000 mg | ORAL_TABLET | Freq: Every day | ORAL | 0 refills | Status: DC
  Filled 2021-01-13: qty 180, 90d supply, fill #0

## 2021-01-13 NOTE — Telephone Encounter (Signed)
Patient requesting flu vac confirmation 11-10-2020 emailed   Patient will call back w/ email address

## 2021-01-14 ENCOUNTER — Telehealth: Payer: Self-pay

## 2021-01-14 NOTE — Telephone Encounter (Signed)
Patient is complaining of having issues such as diarrhea  after taking Liziness.

## 2021-01-14 NOTE — Telephone Encounter (Signed)
[email or web address]

## 2021-01-15 NOTE — Telephone Encounter (Signed)
Scanned and emailed pt's employer record of his flu vaccine.

## 2021-01-16 ENCOUNTER — Ambulatory Visit (INDEPENDENT_AMBULATORY_CARE_PROVIDER_SITE_OTHER): Payer: 59 | Admitting: Orthopaedic Surgery

## 2021-01-16 ENCOUNTER — Encounter: Payer: Self-pay | Admitting: Orthopaedic Surgery

## 2021-01-16 VITALS — BP 177/91 | Ht 74.0 in | Wt 264.0 lb

## 2021-01-16 DIAGNOSIS — M549 Dorsalgia, unspecified: Secondary | ICD-10-CM | POA: Diagnosis not present

## 2021-01-16 NOTE — Telephone Encounter (Signed)
Take Linzess as needed or put on hold. Sch OV w/Dr Maretta Bees

## 2021-01-16 NOTE — Telephone Encounter (Signed)
Sent pt mychart message

## 2021-01-19 NOTE — Progress Notes (Signed)
Office Visit Note   Patient: James Garcia           Date of Birth: 1956-03-10           MRN: [id number] Visit Date: 01/16/2021              Requested by: Horald Pollen, [place],  [place] [address] PCP: Horald Pollen, MD   Assessment & Plan: Visit Diagnoses:  1. Musculoskeletal back pain     Plan: Patient request return to work on 01/19/2021.  We reviewed images and report.  He has some moderate thecal sac narrowing at L3-4 and also some impingement on the right at L4-5.  We discussed possible epidural but this likely flare his diabetes last A1c 7.5 in the past its been 6.24 months ago.  We discussed walking program weight loss.  We discussed activities working out that is likely to bother his lumbar symptoms.  To work on weight loss and I will recheck him again in 3 months.  Follow-Up Instructions: Return in about 3 months (around 04/18/2021).   Orders:  No orders of the defined types were placed in this encounter.  No orders of the defined types were placed in this encounter.     Procedures: No procedures performed   Clinical Data: No additional findings.   Subjective: Chief Complaint  Patient presents with   Lower Back - Follow-up, Pain    MRI lumbar review    HPI 65 year old male, employee with low back pain.  Patient's had low back pain radiates into the right buttocks into the right leg for greater than a year.  No history of fever chills associated bowel bladder symptoms.Patient does have , hypertension and a history of a kidney stones.  Review of Systems all the systems noncontributory HPI.   Objective: Vital Signs: BP (!) 177/91   Ht 6\' 2"  (1.88 m)   Wt 264 lb (119.7 kg)   BMI 33.90 kg/m   Physical Exam Constitutional:      Appearance: He is well-developed.  HENT:     Head: Normocephalic and atraumatic.     Right Ear: External ear normal.     Left Ear: External ear normal.  Eyes:     Pupils: Pupils are equal,  round, and reactive to light.  Neck:     Thyroid: No thyromegaly.     Trachea: No tracheal deviation.  Cardiovascular:     Rate and Rhythm: Normal rate.  Pulmonary:     Effort: Pulmonary effort is normal.     Breath sounds: No wheezing.  Abdominal:     General: Bowel sounds are normal.     Palpations: Abdomen is soft.  Musculoskeletal:     Cervical back: Neck supple.  Skin:    General: Skin is warm and dry.     Capillary Refill: Capillary refill takes less than 2 seconds.  Neurological:     Mental Status: He is alert and oriented to person, place, and time.  Psychiatric:        Behavior: Behavior normal.        Thought Content: Thought content normal.        Judgment: Judgment normal.    Ortho Exam patient is negative logroll is negative popliteal compression test some pain with straight leg raising.  At 90 degrees worse on the right than left.  Anterior tib EHL is intact.  Specialty Comments:  No specialty comments available.  Imaging:   CLINICAL DATA:  Low back pain for over 6 weeks   EXAM: MRI LUMBAR SPINE WITHOUT CONTRAST   TECHNIQUE: Multiplanar, multisequence MR imaging of the lumbar spine was performed. No intravenous contrast was administered.   COMPARISON:  None.   FINDINGS: Segmentation:  Standard.   Alignment:  Physiologic.   Vertebrae:  No fracture, evidence of discitis, or bone lesion.   Conus medullaris and cauda equina: Conus extends to the T12 level. Conus and cauda equina appear normal.   Paraspinal and other soft tissues: No evidence of perispinal mass or inflammation.   Disc levels:   T12- L1: Ventral spondylitic spurring   L1-L2: Mild facet spurring   L2-L3: Mild facet spurring   L3-L4: Disc narrowing and bilateral inferior foraminal bulging with left inferior foraminal protrusion. Asymmetric left facet spurring. Moderate thecal sac compression. Moderate left foraminal narrowing   L4-L5: Disc narrowing and bulging with mild facet  spurring. There is suggestion of a right foraminal herniation on axial T1 weighted imaging that is not clearly defined on sagittal images, although there does appear to be moderate right foraminal narrowing. Mild right foraminal narrowing   L5-S1:Mild disc bulging and facet spurring.   IMPRESSION: 1. Lumbar spine degeneration most notable at L3-4 where there is moderate thecal sac narrowing. 2. L3-4 moderate left foraminal narrowing. 3. Axial images suggest right foraminal impingement at L4-5, but this is less apparent on sagittal imaging.     Electronically Signed   By: Jorje Guild M.D.   On: 01/09/2021 10:56        PMFS History: Patient Active Problem List   Diagnosis Date Noted   Paraumbilical hernia 50/53/9767   Calculus of gallbladder without cholecystitis without obstruction 12/01/2020   Diverticulosis 12/01/2020   Atherosclerosis of aorta ([place]) 12/01/2020   Musculoskeletal back pain 12/01/2020   Right flank pain 11/10/2020   Hypertension associated with diabetes ([place]) 11/10/2020   Diplopia 11/28/2019   Restless leg 11/28/2019   Right abducens nerve palsy 11/27/2019   Left leg pain 09/24/2019   Diabetes ([place]) 05/01/2015   Past Medical History:  Diagnosis Date   Constipation    uses OTC laxatives - hard stools every morning- small amount and feels like doesnt empty    Diabetes mellitus without complication ([place])    DM type 2 (diabetes mellitus, type 2) (HCC)    Hyperlipidemia    Hypertension    Leg cramps    Tuberculin skin test (TST) positive    Tuberculosis    pt states was treated for TB    Family History  Problem Relation Age of Onset   Diabetes Father    Colon cancer Neg Hx    Colon polyps Neg Hx    Rectal cancer Neg Hx    Stomach cancer Neg Hx     Past Surgical History:  Procedure Laterality Date   arm surgery     COLONOSCOPY     ELBOW SURGERY     FOOT SURGERY Bilateral    POLYPECTOMY     Social History   Occupational History    Occupation: Engineer, maintenance (IT) at [employer]   Tobacco Use   Smoking status: Former    Packs/day: 0.25    Years: 5.00    Pack years: 1.25    Types: Cigarettes    Quit date: 06/04/1978    Years since quitting: 42.6   Smokeless tobacco: Never  Vaping Use   Vaping Use: Never used  Substance and Sexual Activity   Alcohol use: No   Drug use:  No   Sexual activity: Not on file

## 2021-01-29 DIAGNOSIS — E113392 Type 2 diabetes mellitus with moderate nonproliferative diabetic retinopathy without macular edema, left eye: Secondary | ICD-10-CM | POA: Diagnosis not present

## 2021-01-29 DIAGNOSIS — E113311 Type 2 diabetes mellitus with moderate nonproliferative diabetic retinopathy with macular edema, right eye: Secondary | ICD-10-CM | POA: Diagnosis not present

## 2021-01-29 DIAGNOSIS — H04123 Dry eye syndrome of bilateral lacrimal glands: Secondary | ICD-10-CM | POA: Diagnosis not present

## 2021-01-29 DIAGNOSIS — H40013 Open angle with borderline findings, low risk, bilateral: Secondary | ICD-10-CM | POA: Diagnosis not present

## 2021-02-02 ENCOUNTER — Other Ambulatory Visit (HOSPITAL_COMMUNITY): Payer: Self-pay

## 2021-02-04 ENCOUNTER — Other Ambulatory Visit: Payer: Self-pay | Admitting: Endocrinology

## 2021-02-04 ENCOUNTER — Other Ambulatory Visit (HOSPITAL_COMMUNITY): Payer: Self-pay

## 2021-02-04 DIAGNOSIS — E1141 Type 2 diabetes mellitus with diabetic mononeuropathy: Secondary | ICD-10-CM

## 2021-02-04 DIAGNOSIS — Z794 Long term (current) use of insulin: Secondary | ICD-10-CM

## 2021-02-04 MED ORDER — FREESTYLE LITE TEST VI STRP
ORAL_STRIP | Freq: Two times a day (BID) | 12 refills | Status: DC
  Filled 2021-02-04: qty 150, 75d supply, fill #0

## 2021-02-09 ENCOUNTER — Other Ambulatory Visit: Payer: Self-pay | Admitting: Endocrinology

## 2021-02-09 ENCOUNTER — Other Ambulatory Visit (HOSPITAL_COMMUNITY): Payer: Self-pay

## 2021-02-09 MED ORDER — BASAGLAR KWIKPEN 100 UNIT/ML ~~LOC~~ SOPN
55.0000 [IU] | PEN_INJECTOR | SUBCUTANEOUS | 3 refills | Status: DC
  Filled 2021-02-09: qty 48, 87d supply, fill #0

## 2021-02-10 ENCOUNTER — Other Ambulatory Visit (HOSPITAL_COMMUNITY): Payer: Self-pay

## 2021-02-12 ENCOUNTER — Other Ambulatory Visit (HOSPITAL_COMMUNITY): Payer: Self-pay

## 2021-02-12 ENCOUNTER — Other Ambulatory Visit: Payer: Self-pay | Admitting: Emergency Medicine

## 2021-02-12 DIAGNOSIS — G6289 Other specified polyneuropathies: Secondary | ICD-10-CM

## 2021-02-12 MED ORDER — GABAPENTIN 100 MG PO CAPS
100.0000 mg | ORAL_CAPSULE | Freq: Two times a day (BID) | ORAL | 0 refills | Status: DC
  Filled 2021-02-12 – 2021-02-19 (×2): qty 180, 90d supply, fill #0

## 2021-02-16 ENCOUNTER — Other Ambulatory Visit: Payer: Self-pay | Admitting: Endocrinology

## 2021-02-16 ENCOUNTER — Other Ambulatory Visit (HOSPITAL_COMMUNITY): Payer: Self-pay

## 2021-02-16 MED ORDER — INSULIN GLARGINE-YFGN 100 UNIT/ML ~~LOC~~ SOPN
55.0000 [IU] | PEN_INJECTOR | Freq: Every day | SUBCUTANEOUS | 3 refills | Status: DC
  Filled 2021-02-16 – 2021-05-15 (×3): qty 60, 109d supply, fill #0
  Filled 2021-05-18: qty 48, 87d supply, fill #0
  Filled 2021-05-18: qty 60, 109d supply, fill #0

## 2021-02-19 ENCOUNTER — Other Ambulatory Visit (HOSPITAL_COMMUNITY): Payer: Self-pay

## 2021-03-23 ENCOUNTER — Other Ambulatory Visit (HOSPITAL_COMMUNITY): Payer: Self-pay

## 2021-03-23 ENCOUNTER — Ambulatory Visit (INDEPENDENT_AMBULATORY_CARE_PROVIDER_SITE_OTHER): Payer: 59 | Admitting: Endocrinology

## 2021-03-23 ENCOUNTER — Other Ambulatory Visit: Payer: Self-pay

## 2021-03-23 VITALS — BP 110/66 | HR 71 | Ht 74.0 in | Wt 269.7 lb

## 2021-03-23 DIAGNOSIS — E1141 Type 2 diabetes mellitus with diabetic mononeuropathy: Secondary | ICD-10-CM

## 2021-03-23 DIAGNOSIS — Z794 Long term (current) use of insulin: Secondary | ICD-10-CM | POA: Diagnosis not present

## 2021-03-23 LAB — POCT GLYCOSYLATED HEMOGLOBIN (HGB A1C): Hemoglobin A1C: 7 % — AB (ref 4.0–5.6)

## 2021-03-23 MED ORDER — FREESTYLE LITE W/DEVICE KIT
1.0000 | PACK | Freq: Every day | 0 refills | Status: AC
  Filled 2021-03-23: qty 1, 1d supply, fill #0

## 2021-03-23 NOTE — Progress Notes (Signed)
Subjective:    Patient ID: James Garcia, male    DOB: [birth year], 66 y.o.   MRN: [id number]  HPI Pt returns for f/u of diabetes mellitus:  DM type: Insulin-requiring type 2 Dx'ed: 2505 Complications: PN and DR.   Therapy: insulin since 2016, Ozempic, and metformin.   DKA: never Severe hypoglycemia: never.   Pancreatitis: never.   SDOH: he works 2nd shift. Other: he declines multiple daily injections; He stopped farxiga, due to itching.   Interval history: no cbg record, but states cbg's vary from 99-218.  All are checked at 11AM-12N.  he has mild hypoglycemia less than once per month.  He has increased insulin to 55 units qam.   Past Medical History:  Diagnosis Date   Constipation    uses OTC laxatives - hard stools every morning- small amount and feels like doesnt empty    Diabetes mellitus without complication (HCC)    DM type 2 (diabetes mellitus, type 2) (HCC)    Hyperlipidemia    Hypertension    Leg cramps    Tuberculin skin test (TST) positive    Tuberculosis    pt states was treated for TB    Past Surgical History:  Procedure Laterality Date   arm surgery     COLONOSCOPY     ELBOW SURGERY     FOOT SURGERY Bilateral    POLYPECTOMY      Social History   Socioeconomic History   Marital status: Married    Spouse name: Not on file   Number of children: 2   Years of education: Not on file   Highest education level: Not on file  Occupational History   Occupation: Floor Tech at [employer]   Tobacco Use   Smoking status: Former    Packs/day: 0.25    Years: 5.00    Pack years: 1.25    Types: Cigarettes    Quit date: 06/04/1978    Years since quitting: 42.8   Smokeless tobacco: Never  Vaping Use   Vaping Use: Never used  Substance and Sexual Activity   Alcohol use: No   Drug use: No   Sexual activity: Not on file  Other Topics Concern   Not on file  Social History Narrative   Lives with family       Social Determinants of Health   Financial Resource  Strain: Not on file  Food Insecurity: Not on file  Transportation Needs: Not on file  Physical Activity: Not on file  Stress: Not on file  Social Connections: Not on file  Intimate Partner Violence: Not on file    Current Outpatient Medications on File Prior to Visit  Medication Sig Dispense Refill   amLODipine (NORVASC) 5 MG tablet TAKE 1 TABLET BY MOUTH ONCE A DAY 90 tablet 3   atorvastatin (LIPITOR) 20 MG tablet TAKE 1 TABLET BY MOUTH ONCE DAILY 90 tablet 3   Blood Pressure Monitor KIT Use as instructed for HTN 1 each 0   cetirizine (ZYRTEC) 10 MG tablet Take 10 mg by mouth daily.     COVID-19 mRNA Vac-TriS, Pfizer, SUSP injection Inject into the muscle. 0.3 mL 0   diazepam (VALIUM) 5 MG tablet Take 2 tablets (10 mg total) by mouth 1 hour before MRI may take other tab as needed, no driving 3 tablet 0   gabapentin (NEURONTIN) 100 MG capsule Take 1 capsule (100 mg total) by mouth 2 (two) times daily. 180 capsule 0   glucose blood test strip  USE TO CHECK BLOOD SUGAR TWICE A DAY 50 strip 0   insulin glargine-yfgn (SEMGLEE, YFGN,) 100 UNIT/ML Pen Inject 55 Units into the skin daily. 60 mL 3   Insulin Pen Needle (UNIFINE PENTIPS) 31G X 8 MM MISC USE WITH INSULIN TWICE DAILY AS DIRECTED 100 each 3   Lancets (FREESTYLE) lancets USE TO CHECK BLOOD SUGAR 2 TIMES PER DAY. 200 each 2   losartan (COZAAR) 100 MG tablet Take 1 tablet (100 mg total) by mouth daily. 90 tablet 0   metFORMIN (GLUCOPHAGE-XR) 500 MG 24 hr tablet Take 2 tablets (1,000 mg total) by mouth daily with breakfast. 180 tablet 0   neomycin-bacitracin-polymyxin (NEOSPORIN) ointment Apply 1 application topically every 12 (twelve) hours. 15 g 0   omeprazole (PRILOSEC) 20 MG capsule Take 1 capsule (20 mg total) by mouth every morning. 90 capsule 0   pramipexole (MIRAPEX) 0.5 MG tablet Take 1 tablet (0.5 mg total) by mouth 3 (three) times daily. 30 tablet 5   Semaglutide, 2 MG/DOSE, (OZEMPIC, 2 MG/DOSE,) 8 MG/3ML SOPN Inject 2 mg into  the skin once a week. 9 mL 3   UNIFINE PENTIPS 31G X 8 MM MISC USE WITH INSULIN TWICE DAILY AS DIRECTED     fluticasone (FLONASE) 50 MCG/ACT nasal spray Place 1-2 sprays into both nostrils daily for 7 days. 1 g 0   linaclotide (LINZESS) 145 MCG CAPS capsule Take 1 capsule (145 mcg total) by mouth daily before breakfast. 30 capsule 1   [DISCONTINUED] albuterol (PROVENTIL HFA;VENTOLIN HFA) 108 (90 Base) MCG/ACT inhaler Inhale 1-2 puffs into the lungs every 4 (four) hours as needed for wheezing or shortness of breath. 1 Inhaler 0   Current Facility-Administered Medications on File Prior to Visit  Medication Dose Route Frequency Provider Last Rate Last Admin   0.9 %  sodium chloride infusion  500 mL Intravenous Once Pyrtle, Lajuan Lines, MD        Allergies  Allergen Reactions   Insulin Glargine Hives    specifically Semglee    Family History  Problem Relation Age of Onset   Diabetes Father    Colon cancer Neg Hx    Colon polyps Neg Hx    Rectal cancer Neg Hx    Stomach cancer Neg Hx     BP 110/66    Pulse 71    Ht 6' 2" (1.88 m)    Wt 269 lb 11.2 oz (122.3 kg)    SpO2 96%    BMI 34.63 kg/m    Review of Systems     Objective:   Physical Exam VITAL SIGNS:  See vs page GENERAL: no distress Pulses: dorsalis pedis intact bilat.   MSK: no deformity of the feet CV: trace bilat leg edema Skin:  no ulcer on the feet.  normal color and temp on the feet. Neuro: sensation is intact to touch on the feet.     Lab Results  Component Value Date   HGBA1C 7.0 (A) 03/23/2021       Assessment & Plan:  Insulin-requiring type 2 DM.  Hypoglycemia, due to insulin: this limits aggressiveness of glycemic control.    Patient Instructions  Please continue the same insulin, Ozempic, and metformin.   check your blood sugar twice a day.  vary the time of day when you check, between before the 3 meals, and at bedtime.  also check if you have symptoms of your blood sugar being too high or too low.   please keep a record of the  readings and bring it to your next appointment here (or you can bring the meter itself).  You can write it on any piece of paper.  please call us sooner if your blood sugar goes below 70, or if you have a lot of readings over 200.   Please come back for a follow-up appointment in 3-4 months.

## 2021-03-23 NOTE — Patient Instructions (Addendum)
Please continue the same insulin, Ozempic, and metformin.   check your blood sugar twice a day.  vary the time of day when you check, between before the 3 meals, and at bedtime.  also check if you have symptoms of your blood sugar being too high or too low.  please keep a record of the readings and bring it to your next appointment here (or you can bring the meter itself).  You can write it on any piece of paper.  please call us sooner if your blood sugar goes below 70, or if you have a lot of readings over 200.   Please come back for a follow-up appointment in 3-4 months.

## 2021-03-24 ENCOUNTER — Other Ambulatory Visit (HOSPITAL_COMMUNITY): Payer: Self-pay

## 2021-03-25 ENCOUNTER — Other Ambulatory Visit (HOSPITAL_COMMUNITY): Payer: Self-pay

## 2021-03-27 ENCOUNTER — Other Ambulatory Visit (HOSPITAL_COMMUNITY): Payer: Self-pay

## 2021-03-27 ENCOUNTER — Other Ambulatory Visit: Payer: Self-pay | Admitting: Emergency Medicine

## 2021-03-27 MED ORDER — OMEPRAZOLE 20 MG PO CPDR
20.0000 mg | DELAYED_RELEASE_CAPSULE | Freq: Every morning | ORAL | 0 refills | Status: DC
  Filled 2021-03-27: qty 90, 90d supply, fill #0

## 2021-03-30 ENCOUNTER — Other Ambulatory Visit (HOSPITAL_COMMUNITY): Payer: Self-pay

## 2021-04-19 ENCOUNTER — Other Ambulatory Visit: Payer: Self-pay | Admitting: Endocrinology

## 2021-04-20 ENCOUNTER — Other Ambulatory Visit (HOSPITAL_COMMUNITY): Payer: Self-pay

## 2021-04-20 MED ORDER — METFORMIN HCL ER 500 MG PO TB24
1000.0000 mg | ORAL_TABLET | Freq: Every day | ORAL | 0 refills | Status: DC
  Filled 2021-04-20: qty 180, 90d supply, fill #0

## 2021-04-21 ENCOUNTER — Ambulatory Visit: Payer: 59 | Admitting: Orthopaedic Surgery

## 2021-05-10 ENCOUNTER — Encounter (HOSPITAL_COMMUNITY): Payer: Self-pay | Admitting: *Deleted

## 2021-05-10 ENCOUNTER — Other Ambulatory Visit: Payer: Self-pay

## 2021-05-10 ENCOUNTER — Ambulatory Visit (HOSPITAL_COMMUNITY)
Admission: EM | Admit: 2021-05-10 | Discharge: 2021-05-10 | Disposition: A | Discharge status: Home / Self Care | Payer: 59 | Attending: Physician Assistant | Admitting: Physician Assistant

## 2021-05-10 DIAGNOSIS — H9192 Unspecified hearing loss, left ear: Secondary | ICD-10-CM | POA: Diagnosis not present

## 2021-05-10 DIAGNOSIS — H9202 Otalgia, left ear: Secondary | ICD-10-CM | POA: Diagnosis not present

## 2021-05-10 DIAGNOSIS — H9312 Tinnitus, left ear: Secondary | ICD-10-CM

## 2021-05-10 MED ORDER — FLUTICASONE PROPIONATE 50 MCG/ACT NA SUSP
1.0000 | Freq: Every day | NASAL | 0 refills | Status: DC
  Filled 2021-05-10: qty 16, 30d supply, fill #0

## 2021-05-10 NOTE — Discharge Instructions (Signed)
Your ear appears normal on exam.  Please continue your allergy medication as previously prescribed and restart Flonase 1 spray in each nostril twice daily for 1 week then decrease to 1 spray in each nostril daily thereafter.  It is important that you follow-up with ENT first thing tomorrow.  Please call to schedule an appointment first thing Monday morning.  If you develop any additional symptoms you need to be seen immediately.

## 2021-05-10 NOTE — ED Provider Notes (Signed)
[place]    CSN: [id number] Arrival date & time: 05/10/21  1324      History   Chief Complaint Chief Complaint  Patient presents with   Otalgia    Lt    HPI TEDD COTTRILL is a 66 y.o. male.   Patient presents today with a 12-hour history of left otalgia.  Reports occasional sharp pains without identifiable trigger.  He reports associated decreased hearing as well as associated tinnitus.  He denies any head trauma or barotrauma.  Denies any recent swimming or airplane travel.  He denies any recent illness or additional symptoms.  He does have seasonal allergies and has morning congestion but this is at baseline.  He has been taking allergy medication as previously prescribed.  Has not tried additional over-the-counter medications for symptom management.  He denies any increased use of ototoxic medication including high doses of aspirin or antibiotics.  He denies history of recurrent ear infections.  He denies any headache, dizziness, fever, weakness, dysarthria, nausea, vomiting.   Past Medical History:  Diagnosis Date   Constipation    uses OTC laxatives - hard stools every morning- small amount and feels like doesnt empty    Diabetes mellitus without complication (HCC)    DM type 2 (diabetes mellitus, type 2) (HCC)    Hyperlipidemia    Hypertension    Leg cramps    Tuberculin skin test (TST) positive    Tuberculosis    pt states was treated for TB    Patient Active Problem List   Diagnosis Date Noted   Paraumbilical hernia 51/88/4166   Calculus of gallbladder without cholecystitis without obstruction 12/01/2020   Diverticulosis 12/01/2020   Atherosclerosis of aorta ([place]) 12/01/2020   Musculoskeletal back pain 12/01/2020   Right flank pain 11/10/2020   Hypertension associated with diabetes ([place]) 11/10/2020   Diplopia 11/28/2019   Restless leg 11/28/2019   Right abducens nerve palsy 11/27/2019   Left leg pain 09/24/2019   Diabetes ([place]) 05/01/2015     Past Surgical History:  Procedure Laterality Date   arm surgery     COLONOSCOPY     ELBOW SURGERY     FOOT SURGERY Bilateral    POLYPECTOMY         Home Medications    Prior to Admission medications   Medication Sig Start Date End Date Taking? Authorizing Provider  amLODipine (NORVASC) 5 MG tablet TAKE 1 TABLET BY MOUTH ONCE A DAY 06/26/20   Horald Pollen, MD  atorvastatin (LIPITOR) 20 MG tablet TAKE 1 TABLET BY MOUTH ONCE DAILY 06/26/20   Horald Pollen, MD  Blood Glucose Monitoring Suppl (FREESTYLE LITE) w/Device KIT Use to test blood sugar daily 03/23/21   Renato Shin, MD  Blood Pressure Monitor KIT Use as instructed for HTN 07/12/17   Wendie Agreste, MD  cetirizine (ZYRTEC) 10 MG tablet Take 10 mg by mouth daily. 08/30/19   [provider]  COVID-19 mRNA Vac-TriS, Pfizer, SUSP injection Inject into the muscle. 09/03/20   Carlyle Basques, MD  diazepam (VALIUM) 5 MG tablet Take 2 tablets (10 mg total) by mouth 1 hour before MRI may take other tab as needed, no driving 09/12/14     fluticasone (FLONASE) 50 MCG/ACT nasal spray Place 1-2 sprays into both nostrils daily. 05/10/21   Suha Schoenbeck, Derry Skill, PA-C  gabapentin (NEURONTIN) 100 MG capsule Take 1 capsule (100 mg total) by mouth 2 (two) times daily. 02/12/21   Horald Pollen, MD  blood test strip USE TO CHECK BLOOD SUGAR TWICE A DAY 05/28/20 05/28/21  Ellison, Sean, MD  °insulin glargine-yfgn (SEMGLEE, YFGN,) 100 UNIT/ML Pen Inject 55 Units into the skin daily. 02/16/21   Ellison, Sean, MD  °Insulin Pen Needle (UNIFINE PENTIPS) 31G X 8 MM MISC USE WITH INSULIN TWICE DAILY AS DIRECTED 07/10/20 07/10/21  Ellison, Sean, MD  °Lancets (FREESTYLE) lancets USE TO CHECK BLOOD SUGAR 2 TIMES PER DAY. 12/18/20   Sagardia, Miguel Jose, MD  °linaclotide (LINZESS) 145 MCG CAPS capsule Take 1 capsule (145 mcg total) by mouth daily before breakfast. 12/01/20   Sagardia, Miguel Jose, MD  °losartan (COZAAR) 100 MG tablet Take 1  tablet (100 mg total) by mouth daily. 11/30/17   Greene, Jeffrey R, MD  °metFORMIN (GLUCOPHAGE-XR) 500 MG 24 hr tablet Take 2 tablets (1,000 mg total) by mouth daily with breakfast. 04/20/21   Ellison, Sean, MD  °neomycin-bacitracin-polymyxin (NEOSPORIN) ointment Apply 1 application topically every 12 (twelve) hours. 09/20/19   Schmitz, Jeremy E, MD  °omeprazole (PRILOSEC) 20 MG capsule Take 1 capsule (20 mg total) by mouth every morning. 03/27/21   Sagardia, Miguel Jose, MD  °pramipexole (MIRAPEX) 0.5 MG tablet Take 1 tablet (0.5 mg total) by mouth 3 (three) times daily. 11/28/19   Sater, Richard A, MD  °Semaglutide, 2 MG/DOSE, (OZEMPIC, 2 MG/DOSE,) 8 MG/3ML SOPN Inject 2 mg into the skin once a week. 12/08/20   Ellison, Sean, MD  °UNIFINE PENTIPS 31G X 8 MM MISC USE WITH INSULIN TWICE DAILY AS DIRECTED 09/13/19   [provider]  °albuterol (PROVENTIL HFA;VENTOLIN HFA) 108 (90 Base) MCG/ACT inhaler Inhale 1-2 puffs into the lungs every 4 (four) hours as needed for wheezing or shortness of breath. 12/24/17 08/29/19  Greene, Jeffrey R, MD  ° ° °Family History °Family History  °Problem Relation Age of Onset  ° Diabetes Father   ° Colon cancer Neg Hx   ° Colon polyps Neg Hx   ° Rectal cancer Neg Hx   ° Stomach cancer Neg Hx   ° ° °Social History °Social History  ° °Tobacco Use  ° Smoking status: Former  °  Packs/day: 0.25  °  Years: 5.00  °  Pack years: 1.25  °  Types: Cigarettes  °  Quit date: 06/04/1978  °  Years since quitting: 42.9  ° Smokeless tobacco: Never  °Vaping Use  ° Vaping Use: Never used  °Substance Use Topics  ° Alcohol use: No  ° Drug use: No  ° ° ° °Allergies   °Insulin glargine ° ° °Review of Systems °Review of Systems  °Constitutional:  Positive for activity change. Negative for appetite change, fatigue and fever.  °HENT:  Positive for congestion (chronic), ear pain, hearing loss and tinnitus. Negative for ear discharge, postnasal drip and sore throat.   °Respiratory:  Negative for cough and shortness  of breath.   °Cardiovascular:  Negative for chest pain.  °Gastrointestinal:  Negative for abdominal pain, diarrhea, nausea and vomiting.  °Neurological:  Negative for dizziness, facial asymmetry, speech difficulty, weakness, light-headedness, numbness and headaches.  ° ° °Physical Exam °Triage Vital Signs °ED Triage Vitals  °Enc Vitals Group  °   BP 05/10/21 1356 (!) 149/84  °   Pulse Rate 05/10/21 1356 69  °   Resp 05/10/21 1356 18  °   Temp 05/10/21 1356 98.3 °F (36.8 °C)  °   Temp src --   °   SpO2 05/10/21 1356 96 %  °   Weight --   °     Height --   °   Head Circumference --   °   Peak Flow --   °   Pain Score 05/10/21 1354 6  °   Pain Loc --   °   Pain Edu? --   °   Excl. in GC? --   ° °No data found. ° °Updated Vital Signs °BP (!) 149/84    Pulse 69    Temp 98.3 °F (36.8 °C)    Resp 18    SpO2 96%  ° °Visual Acuity °Right Eye Distance:   °Left Eye Distance:   °Bilateral Distance:   ° °Right Eye Near:   °Left Eye Near:    °Bilateral Near:    ° °Physical Exam °Vitals reviewed.  °Constitutional:   °   General: He is awake.  °   Appearance: Normal appearance. He is well-developed. He is not ill-appearing.  °   Comments: Very pleasant male appears stated age in no acute distress sitting comfortably in exam room  °HENT:  °   Head: Normocephalic and atraumatic.  °   Right Ear: Tympanic membrane, ear canal and external ear normal. Tympanic membrane is not erythematous or bulging.  °   Left Ear: Ear canal and external ear normal. Tympanic membrane is retracted. Tympanic membrane is not erythematous or bulging.  °   Ears:  °   Comments: Patient failed to finger rub test on left. °   Nose: Nose normal.  °   Mouth/Throat:  °   Pharynx: Uvula midline. Posterior oropharyngeal erythema present. No oropharyngeal exudate or uvula swelling.  °Eyes:  °   Extraocular Movements: Extraocular movements intact.  °   Conjunctiva/sclera: Conjunctivae normal.  °   Pupils: Pupils are equal, round, and reactive to light.  °Cardiovascular:   °   Rate and Rhythm: Normal rate and regular rhythm.  °   Heart sounds: Normal heart sounds, S1 normal and S2 normal. No murmur heard. °Pulmonary:  °   Effort: Pulmonary effort is normal. No accessory muscle usage or respiratory distress.  °   Breath sounds: Normal breath sounds. No stridor. No wheezing, rhonchi or rales.  °   Comments: Clear to auscultation bilaterally °Neurological:  °   Mental Status: He is alert.  °   Cranial Nerves: No facial asymmetry.  °   Motor: Motor function is intact.  °   Coordination: Coordination is intact.  °   Comments: Patient failed finger rub test on the left; no additional cranial nerve defect on exam.  °Psychiatric:     °   Behavior: Behavior is cooperative.  ° ° ° °UC Treatments / Results  °Labs °(all labs ordered are listed, but only abnormal results are displayed) °Labs Reviewed - No data to display ° °EKG ° ° °Radiology °No results found. ° °Procedures °Procedures (including critical care time) ° °Medications Ordered in UC °Medications - No data to display ° °Initial Impression / Assessment and Plan / UC Course  °I have reviewed the triage vital signs and the nursing notes. ° °Pertinent labs & imaging results that were available during my care of the patient were reviewed by me and considered in my medical decision making (see chart for details). ° °  ° °No obvious infection or cerumen impaction that would explain patient's symptoms.  Eardrum is slightly retracted so concern for eustachian tube dysfunction is contributing to symptoms.  He was encouraged to take allergy medication and begin Flonase regularly.  Discussed that he would   need to see ENT immediately given sudden hearing change and was given contact information for local provider with instruction to call to schedule an appointment tomorrow.  Discussed that if he has any worsening symptoms he needs to go to the emergency room immediately for further evaluation and potential imaging.  Discussed case with Dr.  Banister who agreed with treatment plan. ° °Final Clinical Impressions(s) / UC Diagnoses  ° °Final diagnoses:  °Otalgia of left ear  °Hearing loss of left ear, unspecified hearing loss type  °Tinnitus of left ear  ° ° ° °Discharge Instructions   ° °  °Your ear appears normal on exam.  Please continue your allergy medication as previously prescribed and restart Flonase 1 spray in each nostril twice daily for 1 week then decrease to 1 spray in each nostril daily thereafter.  It is important that you follow-up with ENT first thing tomorrow.  Please call to schedule an appointment first thing Monday morning.  If you develop any additional symptoms you need to be seen immediately. ° ° ° ° °ED Prescriptions   ° ° Medication Sig Dispense Auth. Provider  ° fluticasone (FLONASE) 50 MCG/ACT nasal spray Place 1-2 sprays into both nostrils daily. 16 g Raspet, Erin K, PA-C  ° °  ° °PDMP not reviewed this encounter. °  °Raspet, Erin K, PA-C °05/10/21 1417 ° °

## 2021-05-10 NOTE — ED Triage Notes (Signed)
Pt reports he lost hearing in Lt ear yesterday and has ear pain.

## 2021-05-11 ENCOUNTER — Other Ambulatory Visit (HOSPITAL_COMMUNITY): Payer: Self-pay

## 2021-05-15 ENCOUNTER — Other Ambulatory Visit (HOSPITAL_COMMUNITY): Payer: Self-pay

## 2021-05-18 ENCOUNTER — Other Ambulatory Visit: Payer: Self-pay | Admitting: Endocrinology

## 2021-05-18 ENCOUNTER — Telehealth: Payer: Self-pay

## 2021-05-18 ENCOUNTER — Other Ambulatory Visit (HOSPITAL_COMMUNITY): Payer: Self-pay

## 2021-05-18 DIAGNOSIS — E1141 Type 2 diabetes mellitus with diabetic mononeuropathy: Secondary | ICD-10-CM

## 2021-05-18 MED ORDER — INSULIN GLARGINE-YFGN 100 UNIT/ML ~~LOC~~ SOPN
55.0000 [IU] | PEN_INJECTOR | Freq: Every day | SUBCUTANEOUS | 3 refills | Status: DC
  Filled 2021-05-18: qty 60, 109d supply, fill #0
  Filled 2021-05-18: qty 3, 5d supply, fill #0
  Filled 2021-05-22: qty 48, 87d supply, fill #1
  Filled 2021-08-26 – 2021-09-16 (×2): qty 48, 87d supply, fill #2

## 2021-05-18 NOTE — Telephone Encounter (Signed)
Patient's med list says that James Garcia has been discontinued but the patient says that he still takes the medication at 55 units/day. Is it okay to refill as the patient is almost out? ?

## 2021-05-18 NOTE — Telephone Encounter (Signed)
As I were sending the RX for Semglee I received a contraindication  ?

## 2021-05-18 NOTE — Addendum Note (Signed)
Addended by: Sarina Ill on: 05/18/2021 03:05 PM ? ? Modules accepted: Orders ? ?

## 2021-05-18 NOTE — Telephone Encounter (Signed)
Spoke with provider to receive authorization on patient's Semglee (per patient's request) which contraindication came up and given the ok to refill. Informed [facility] as well. ?

## 2021-05-18 NOTE — Telephone Encounter (Signed)
[facility] wanted to get clarification on the pt's insulin. They stated that pt is allergic to semglee but it was sent in for a refill. They wanted to know if he is allergic to semglee is he switching back to basaglar. ? ?Please clarify. ? ?P.S. I did see the note where you said to refill semglee PRN but im confused if pt is allergic to it. ?

## 2021-05-18 NOTE — Telephone Encounter (Signed)
RX sent and mychart message sent to patient.  ?

## 2021-05-19 ENCOUNTER — Encounter: Payer: Self-pay | Admitting: Emergency Medicine

## 2021-05-19 ENCOUNTER — Ambulatory Visit (INDEPENDENT_AMBULATORY_CARE_PROVIDER_SITE_OTHER): Payer: 59 | Admitting: Emergency Medicine

## 2021-05-19 VITALS — BP 130/70 | HR 63 | Temp 98.0°F | Ht 74.0 in | Wt 270.0 lb

## 2021-05-19 DIAGNOSIS — E1159 Type 2 diabetes mellitus with other circulatory complications: Secondary | ICD-10-CM | POA: Diagnosis not present

## 2021-05-19 DIAGNOSIS — H9192 Unspecified hearing loss, left ear: Secondary | ICD-10-CM

## 2021-05-19 DIAGNOSIS — H9202 Otalgia, left ear: Secondary | ICD-10-CM | POA: Diagnosis not present

## 2021-05-19 DIAGNOSIS — I152 Hypertension secondary to endocrine disorders: Secondary | ICD-10-CM

## 2021-05-19 DIAGNOSIS — E785 Hyperlipidemia, unspecified: Secondary | ICD-10-CM

## 2021-05-19 DIAGNOSIS — E1169 Type 2 diabetes mellitus with other specified complication: Secondary | ICD-10-CM

## 2021-05-19 NOTE — Assessment & Plan Note (Signed)
Well-controlled hypertension with normal blood pressure readings at home.  Continue losartan 100 mg daily.  Dietary approaches to stop hypertension discussed. ?Well-controlled diabetes  ?Lab Results  ?Component Value Date  ? HGBA1C 7.0 (A) 03/23/2021  ?Last endocrinology visit with Dr. Loanne Drilling, assessment and plan as follows: ?Assessment & Plan:  ?Insulin-requiring type 2 DM.  ?Hypoglycemia, due to insulin: this limits aggressiveness of glycemic control.   ?  ?Patient Instructions  ?Please continue the same insulin, Ozempic, and metformin.   ?check your blood sugar twice a day.  vary the time of day when you check, between before the 3 meals, and at bedtime.  also check if you have symptoms of your blood sugar being too high or too low.  please keep a record of the readings and bring it to your next appointment here (or you can bring the meter itself).  You can write it on any piece of paper.  please call us sooner if your blood sugar goes below 70, or if you have a lot of readings over 200.   ?Please come back for a follow-up appointment in 3-4 months.  ?  ? ? ? ?

## 2021-05-19 NOTE — Patient Instructions (Signed)
P?rdida auditiva ?Hearing Loss ?La p?rdida auditiva es la p?rdida total o parcial de la capacidad de o?r. Puede ser transitoria o Radene Ou, y ocurrir en uno o en ambos o?dos. ?Se necesita atenci?n m?dica para tratar correctamente la p?rdida [place] y para evitar que la afecci?n empeore. Es posible recuperar la audici?n parcial o totalmente, en funci?n de la causa de la p?rdida [place] y de su gravedad. En algunos casos la p?rdida auditiva es permanente. ??Cu?les son las causas? ?Las causas frecuentes de la p?rdida auditiva incluyen lo siguiente: ?Exceso de cerumen en el conducto auditivo externo. ?Infecci?n del conducto auditivo externo o del o?do medio. ?L?quido en el o?do medio. ?Lesi?n en el o?do o en la zona alrededor del o?do. ?Un objeto atascado en el o?do. ?Antecedentes de exposici?n prolongada a ruidos fuertes, como la m?sica. ?Las causas menos frecuentes de la p?rdida auditiva incluyen lo siguiente: ?Tumores en el o?do. ?Infecciones bacterianas o virales, como meningitis. ?Un orificio en la membrana del t?mpano (membrana del t?mpano perforada). ?Problemas en el nervio auditivo que env?a las se?ales entre el cerebro y el o?do. ?Ciertos medicamentos. ??Cu?les son los signos o los s?ntomas? ?Los s?ntomas de esta afecci?n pueden incluir los siguientes: ?Dificultad para Market researcher entre los sonidos. ?Dificultad para seguir una conversaci?n cuando hay ruido ambiental. ?Ausencia de respuesta a los ruidos del entorno. Esto puede ser m?s notorio cuando no hay respuesta a los ruidos inesperados. ?Necesidad de subir el volumen del televisor, la radio u otros dispositivos. ?Pitidos en el o?do. ?Mareos. ??C?mo se diagnostica? ?Esta afecci?n se diagnostica en funci?n de lo siguiente: ?Un examen f?sico. ?Una prueba de audici?n (audiometr?a). Un especialista en audici?n (audi?logo) realizar? la audiometr?a. ?Es posible que lo deriven a un especialista en garganta, Doran Durand y o?do (otorrinolaring?logo). ??C?mo  se trata? ?El tratamiento para la p?rdida de la audici?n incluye lo siguiente: ?Extracci?n del cerumen. ?Medicamentos para tratar o prevenir una infecci?n (antibi?ticos). ?Medicamentos para reducir la inflamaci?n (corticoesteroides). ?Aud?fonos para la p?rdida auditiva relacionada con el da?o nervioso. ?Siga estas instrucciones en su casa: ?Si le recetaron un antibi?tico, t?melo como se lo haya indicado el m?dico. No deje de tomar los antibi?ticos aunque comience a sentirse mejor. ?[employer] de venta libre y los recetados solamente como se lo haya indicado el m?dico. ?Evite los ruidos fuertes. ?Retome sus actividades normales seg?n lo indicado por el m?dico. Preg?ntele al m?dico qu? actividades son seguras para usted. ?Concurra a todas las visitas de seguimiento como se lo haya indicado el m?dico. Esto es importante. ?Comun?quese con un m?dico si: ?Siente mareos. ?Tiene nuevos s?ntomas. ?Vomita o siente n?useas. ?Tiene fiebre. ?Solicite ayuda inmediatamente si: ?Percibe cambios repentinos en la visi?n. ?Tiene dolor intenso de o?dos. ?Aumenta su fatiga o debilidad. ?Tiene un dolor de cabeza intenso. ?Resumen ?La p?rdida auditiva es una disminuci?n de la capacidad de o?r sonidos a su alrededor. Puede ser transitoria o permanente. ?El tratamiento depender? de la causa de la p?rdida [place]. Puede incluir la extracci?n de cerumen, medicamentos o un aud?fono. ?Es posible recuperar la audici?n parcial o totalmente, en funci?n de la causa de la p?rdida [place] y de su gravedad. ?Concurra a todas las visitas de seguimiento como se lo haya indicado el m?dico. Esto es importante. ?Esta informaci?n no tiene Marine scientist el consejo del m?dico. Aseg?rese de hacerle al m?dico cualquier pregunta que tenga. ?Document Revised: 01/12/2018 Document Reviewed: 01/12/2018 ?Elsevier Patient Education ? [place]. ? ?

## 2021-05-19 NOTE — Assessment & Plan Note (Signed)
Stable.  Continue atorvastatin 20 mg daily. ?The 10-year ASCVD risk score (Arnett DK, et al., 2019) is: 24.6% ?  Values used to calculate the score: ?    Age: 66 years ?    Sex: Male ?    Is Non-Hispanic African American: No ?    Diabetic: Yes ?    Tobacco smoker: No ?    Systolic Blood Pressure: 396 mmHg ?    Is BP treated: Yes ?    HDL Cholesterol: 51 mg/dL ?    Total Cholesterol: 153 mg/dL ? ?

## 2021-05-19 NOTE — Progress Notes (Signed)
Lab Results  Component Value Date   HGBA1C 7.0 (A) 03/23/2021   [name] 66 y.o.   Chief Complaint  Patient presents with   Ear Pain    Left, hearing ringing noise, referral ENT.   Follow-up    diabetes    HISTORY OF PRESENT ILLNESS: This is a 66 y.o. male complaining of left ear discomfort along with loss of hearing that started about 10 days ago.  Was seen at urgent care center on 05/10/2021 with unremarkable findings. Needs ENT referral. Also has history of diabetes.  Sees Dr. Loanne Drilling on a regular basis.  Presently on insulin, Ozempic, and metformin.  Last hemoglobin A1c less than 2 months ago at 7.0. No other complaints or medical concerns today.   HPI   Prior to Admission medications   Medication Sig Start Date End Date Taking? Authorizing Provider  amLODipine (NORVASC) 5 MG tablet TAKE 1 TABLET BY MOUTH ONCE A DAY 06/26/20  Yes Raymie Giammarco, Ines Bloomer, MD  atorvastatin (LIPITOR) 20 MG tablet TAKE 1 TABLET BY MOUTH ONCE DAILY 06/26/20  Yes Mirage Pfefferkorn, Ines Bloomer, MD  Blood Glucose Monitoring Suppl (FREESTYLE LITE) w/Device KIT Use to test blood sugar daily 03/23/21  Yes Renato Shin, MD  Blood Pressure Monitor KIT Use as instructed for HTN 07/12/17  Yes Wendie Agreste, MD  cetirizine (ZYRTEC) 10 MG tablet Take 10 mg by mouth daily. 08/30/19  Yes [provider]  COVID-19 mRNA Vac-TriS, Pfizer, SUSP injection Inject into the muscle. 09/03/20  Yes Carlyle Basques, MD  diazepam (VALIUM) 5 MG tablet Take 2 tablets (10 mg total) by mouth 1 hour before MRI may take other tab as needed, no driving 26/33/35  Yes   fluticasone (FLONASE) 50 MCG/ACT nasal spray Place 1-2 sprays into both nostrils daily. 05/10/21  Yes Raspet, Erin K, PA-C  gabapentin (NEURONTIN) 100 MG capsule Take 1 capsule (100 mg total) by mouth 2 (two) times daily. 02/12/21  Yes Tanay Massiah, Ines Bloomer, MD  glucose blood test strip USE TO CHECK BLOOD SUGAR TWICE A DAY 05/28/20 05/28/21 Yes Renato Shin, MD   insulin glargine-yfgn (SEMGLEE, YFGN,) 100 UNIT/ML Pen Inject 55 Units into the skin daily. 05/18/21  Yes Renato Shin, MD  Insulin Pen Needle (UNIFINE PENTIPS) 31G X 8 MM MISC USE WITH INSULIN TWICE DAILY AS DIRECTED 07/10/20 07/10/21 Yes Renato Shin, MD  Lancets (FREESTYLE) lancets USE TO CHECK BLOOD SUGAR 2 TIMES PER DAY. 12/18/20  Yes Abiola Behring, Ines Bloomer, MD  losartan (COZAAR) 100 MG tablet Take 1 tablet (100 mg total) by mouth daily. 11/30/17  Yes Wendie Agreste, MD  metFORMIN (GLUCOPHAGE-XR) 500 MG 24 hr tablet Take 2 tablets (1,000 mg total) by mouth daily with breakfast. 04/20/21  Yes Renato Shin, MD  neomycin-bacitracin-polymyxin (NEOSPORIN) ointment Apply 1 application topically every 12 (twelve) hours. 09/20/19  Yes Rosemarie Ax, MD  omeprazole (PRILOSEC) 20 MG capsule Take 1 capsule (20 mg total) by mouth every morning. 03/27/21  Yes Herb Beltre, Ines Bloomer, MD  pramipexole (MIRAPEX) 0.5 MG tablet Take 1 tablet (0.5 mg total) by mouth 3 (three) times daily. 11/28/19  Yes Sater, Nanine Means, MD  Semaglutide, 2 MG/DOSE, (OZEMPIC, 2 MG/DOSE,) 8 MG/3ML SOPN Inject 2 mg into the skin once a week. 12/08/20  Yes Renato Shin, MD  UNIFINE PENTIPS 31G X 8 MM MISC USE WITH INSULIN TWICE DAILY AS DIRECTED 09/13/19  Yes [provider]  linaclotide (LINZESS) 145 MCG CAPS capsule Take 1 capsule (145 mcg total) by mouth daily  before breakfast. 12/01/20   Horald Pollen, MD  albuterol (PROVENTIL HFA;VENTOLIN HFA) 108 (90 Base) MCG/ACT inhaler Inhale 1-2 puffs into the lungs every 4 (four) hours as needed for wheezing or shortness of breath. 12/24/17 08/29/19  Wendie Agreste, MD    Allergies  Allergen Reactions   Insulin Glargine Hives    specifically Semglee    Patient Active Problem List   Diagnosis Date Noted   Paraumbilical hernia 54/36/0677   Calculus of gallbladder without cholecystitis without obstruction 12/01/2020   Diverticulosis 12/01/2020   Atherosclerosis of aorta  ([place]) 12/01/2020   Hypertension associated with diabetes ([place]) 11/10/2020   Restless leg 11/28/2019   Diabetes ([place]) 05/01/2015    Past Medical History:  Diagnosis Date   Constipation    uses OTC laxatives - hard stools every morning- small amount and feels like doesnt empty    Diabetes mellitus without complication ([place])    DM type 2 (diabetes mellitus, type 2) ([facility])    Hyperlipidemia    Hypertension    Leg cramps    Tuberculin skin test (TST) positive    Tuberculosis    pt states was treated for TB    Past Surgical History:  Procedure Laterality Date   arm surgery     COLONOSCOPY     ELBOW SURGERY     FOOT SURGERY Bilateral    POLYPECTOMY      Social History   Socioeconomic History   Marital status: Married    Spouse name: Not on file   Number of children: 2   Years of education: Not on file   Highest education level: Not on file  Occupational History   Occupation: Floor Tech at [employer]   Tobacco Use   Smoking status: Former    Packs/day: 0.25    Years: 5.00    Pack years: 1.25    Types: Cigarettes    Quit date: 06/04/1978    Years since quitting: 42.9   Smokeless tobacco: Never  Vaping Use   Vaping Use: Never used  Substance and Sexual Activity   Alcohol use: No   Drug use: No   Sexual activity: Not on file  Other Topics Concern   Not on file  Social History Narrative   Lives with family       Social Determinants of Health   Financial Resource Strain: Not on file  Food Insecurity: Not on file  Transportation Needs: Not on file  Physical Activity: Not on file  Stress: Not on file  Social Connections: Not on file  Intimate Partner Violence: Not on file    Family History  Problem Relation Age of Onset   Diabetes Father    Colon cancer Neg Hx    Colon polyps Neg Hx    Rectal cancer Neg Hx    Stomach cancer Neg Hx      Review of Systems  Constitutional: Negative.  Negative for chills and fever.  HENT:  Positive for ear pain and hearing  loss. Negative for congestion, ear discharge, nosebleeds, sinus pain and sore throat.   Respiratory: Negative.  Negative for cough and shortness of breath.   Cardiovascular: Negative.  Negative for chest pain and palpitations.  Gastrointestinal:  Negative for abdominal pain, diarrhea, nausea and vomiting.  Genitourinary: Negative.  Negative for dysuria and hematuria.  Musculoskeletal:  Positive for back pain.  Skin: Negative.  Negative for rash.  Neurological: Negative.  Negative for dizziness and headaches.  All other systems reviewed and are negative.  Today's Vitals   05/19/21 1026 05/19/21 1136  BP: (!) 166/80 130/70  Pulse: 63   Temp: 98 F (36.7 C)   TempSrc: Oral   SpO2: 94%   Weight: 270 lb (122.5 kg)   Height: _0  (1.88 m)    Body mass index is 34.67 kg/m.   Physical Exam Vitals reviewed.  Constitutional:      Appearance: Normal appearance.  HENT:     Head: Normocephalic.     Right Ear: Ear canal normal.     Left Ear: Ear canal normal.     Ears:     Comments: Power outage in the office.  No portable otoscope available at present time. Eyes:     Extraocular Movements: Extraocular movements intact.     Conjunctiva/sclera: Conjunctivae normal.     Pupils: Pupils are equal, round, and reactive to light.  Cardiovascular:     Rate and Rhythm: Normal rate.  Pulmonary:     Effort: Pulmonary effort is normal.  Musculoskeletal:     Cervical back: No tenderness.  Lymphadenopathy:     Cervical: No cervical adenopathy.  Skin:    General: Skin is warm and dry.  Neurological:     General: No focal deficit present.     Mental Status: He is alert and oriented to person, place, and time.  Psychiatric:        Mood and Affect: Mood normal.        Behavior: Behavior normal.     ASSESSMENT & PLAN: Problem List Items Addressed This Visit       Cardiovascular and Mediastinum   Hypertension associated with diabetes ([place])    Well-controlled hypertension with normal  blood pressure readings at home.  Continue losartan 100 mg daily.  Dietary approaches to stop hypertension discussed. Well-controlled diabetes  Lab Results  Component Value Date   HGBA1C 7.0 (A) 03/23/2021  Last endocrinology visit with Dr. Loanne Drilling, assessment and plan as follows: Assessment & Plan:  Insulin-requiring type 2 DM.  Hypoglycemia, due to insulin: this limits aggressiveness of glycemic control.     Patient Instructions  Please continue the same insulin, Ozempic, and metformin.   check your blood sugar twice a day.  vary the time of day when you check, between before the 3 meals, and at bedtime.  also check if you have symptoms of your blood sugar being too high or too low.  please keep a record of the readings and bring it to your next appointment here (or you can bring the meter itself).  You can write it on any piece of paper.  please call us sooner if your blood sugar goes below 70, or if you have a lot of readings over 200.   Please come back for a follow-up appointment in 3-4 months.              Endocrine   Dyslipidemia associated with type 2 diabetes mellitus ([place])    Stable.  Continue atorvastatin 20 mg daily. The 10-year ASCVD risk score (Arnett DK, et al., 2019) is: 24.6%   Values used to calculate the score:     Age: 38 years     Sex: Male     Is Non-Hispanic African American: No     Diabetic: Yes     Tobacco smoker: No     Systolic Blood Pressure: 220 mmHg     Is BP treated: Yes     HDL Cholesterol: 51 mg/dL     Total Cholesterol: 153  mg/dL       Other Visit Diagnoses     Ear discomfort, left    -  Primary   Relevant Orders   Ambulatory referral to ENT   Decreased hearing of left ear       Relevant Orders   Ambulatory referral to ENT      Patient Instructions  Prdida auditiva Hearing Loss La prdida auditiva es la prdida total o parcial de la capacidad de or. Puede ser transitoria o Radene Ou, y ocurrir en uno o en ambos odos. Se  necesita atencin mdica para tratar correctamente la prdida [place] y para evitar que la afeccin empeore. Es posible recuperar la audicin parcial o totalmente, en funcin de la causa de la prdida [place] y de su gravedad. En algunos casos la prdida auditiva es permanente. Cules son las causas? Las causas frecuentes de la prdida auditiva incluyen lo siguiente: Exceso de cerumen en el conducto auditivo externo. Infeccin del conducto auditivo externo o del odo medio. Lquido en el odo medio. Lesin en el odo o en la zona alrededor del odo. Un objeto atascado en el odo. Antecedentes de exposicin prolongada a ruidos fuertes, Programmer, systems. Las causas menos frecuentes de la prdida auditiva incluyen lo siguiente: Tumores en el odo. Infecciones bacterianas o virales, como meningitis. Un orificio en la membrana del tmpano (membrana del tmpano perforada). Problemas en el nervio auditivo que enva las seales entre el cerebro y el odo. Ciertos medicamentos. Cules son los signos o los sntomas? Los sntomas de esta afeccin pueden incluir los siguientes: Dificultad para Product manager diferencia entre los sonidos. Dificultad para seguir una conversacin cuando hay ruido ambiental. Ausencia de respuesta a los ruidos del entorno. Esto puede ser ms notorio cuando no hay respuesta a los ruidos inesperados. Necesidad de subir el volumen del Development worker, community, la radio u otros dispositivos. Pitidos en el odo. Mareos. Cmo se diagnostica? Esta afeccin se diagnostica en funcin de lo siguiente: Un examen fsico. Una prueba de audicin Lorel [place]). Un especialista en audicin (audilogo) realizar la [place]. Es posible que lo deriven a un especialista en garganta, Doran Durand y odo (otorrinolaringlogo). Cmo se trata? El tratamiento para la prdida de la audicin incluye lo siguiente: Extraccin del cerumen. Medicamentos para tratar o prevenir una infeccin  (antibiticos). Medicamentos para reducir la inflamacin (corticoesteroides). Audfonos para la prdida [place] relacionada con el dao nervioso. Siga estas instrucciones en su casa: Si le recetaron un antibitico, tmelo como se lo haya indicado el mdico. No deje de tomar los antibiticos aunque comience a Sports administrator. Tome los medicamentos de venta libre y los recetados solamente como se lo haya indicado el mdico. Evite los ruidos fuertes. Retome sus actividades normales segn lo indicado por el mdico. Pregntele al mdico qu actividades son seguras para usted. Concurra a todas las visitas de seguimiento como se lo haya indicado el mdico. Esto es importante. Comunquese con un mdico si: Siente mareos. Tiene nuevos sntomas. Vomita o siente nuseas. Tiene fiebre. Solicite ayuda inmediatamente si: Percibe cambios repentinos en la visin. Tiene dolor intenso de odos. Aumenta su fatiga o debilidad. Tiene un dolor de cabeza intenso. Resumen La prdida [place] es una disminucin de la capacidad de or sonidos a su alrededor. Puede ser transitoria o permanente. El tratamiento depender de la causa de la prdida [place]. Puede incluir la extraccin de cerumen, medicamentos o un audfono. Es posible recuperar la audicin parcial o totalmente, en funcin de la causa de la prdida [place] y de su gravedad. Concurra a  todas las visitas de [employer] se lo haya indicado el mdico. Esto es importante. Esta informacin no tiene Marine scientist el consejo del mdico. Asegrese de hacerle al mdico cualquier pregunta que tenga. Document Revised: 01/12/2018 Document Reviewed: 01/12/2018 Elsevier Patient Education  2022 [place], MD [facility]

## 2021-05-20 ENCOUNTER — Other Ambulatory Visit (HOSPITAL_COMMUNITY): Payer: Self-pay

## 2021-05-22 ENCOUNTER — Other Ambulatory Visit (HOSPITAL_COMMUNITY): Payer: Self-pay

## 2021-05-25 ENCOUNTER — Other Ambulatory Visit: Payer: Self-pay | Admitting: Emergency Medicine

## 2021-05-25 ENCOUNTER — Other Ambulatory Visit (HOSPITAL_COMMUNITY): Payer: Self-pay

## 2021-05-25 DIAGNOSIS — G6289 Other specified polyneuropathies: Secondary | ICD-10-CM

## 2021-05-25 MED ORDER — GABAPENTIN 100 MG PO CAPS
100.0000 mg | ORAL_CAPSULE | Freq: Two times a day (BID) | ORAL | 0 refills | Status: DC
  Filled 2021-05-25: qty 180, 90d supply, fill #0

## 2021-06-02 ENCOUNTER — Other Ambulatory Visit (HOSPITAL_COMMUNITY): Payer: Self-pay

## 2021-06-17 ENCOUNTER — Other Ambulatory Visit: Payer: Self-pay | Admitting: Endocrinology

## 2021-06-17 ENCOUNTER — Other Ambulatory Visit (HOSPITAL_COMMUNITY): Payer: Self-pay

## 2021-06-17 DIAGNOSIS — Z794 Long term (current) use of insulin: Secondary | ICD-10-CM

## 2021-06-17 DIAGNOSIS — E1141 Type 2 diabetes mellitus with diabetic mononeuropathy: Secondary | ICD-10-CM

## 2021-06-17 MED ORDER — FREESTYLE LITE TEST VI STRP
1.0000 | ORAL_STRIP | Freq: Two times a day (BID) | 12 refills | Status: DC
  Filled 2021-06-17: qty 150, 75d supply, fill #0
  Filled 2021-09-16: qty 150, 75d supply, fill #1
  Filled 2022-01-01: qty 150, 75d supply, fill #2
  Filled 2022-04-16: qty 150, 75d supply, fill #3
  Filled 2022-04-16: qty 200, 90d supply, fill #3

## 2021-06-24 ENCOUNTER — Other Ambulatory Visit (HOSPITAL_COMMUNITY): Payer: Self-pay

## 2021-06-25 ENCOUNTER — Other Ambulatory Visit: Payer: Self-pay | Admitting: Emergency Medicine

## 2021-06-25 ENCOUNTER — Other Ambulatory Visit (HOSPITAL_COMMUNITY): Payer: Self-pay

## 2021-06-25 DIAGNOSIS — I10 Essential (primary) hypertension: Secondary | ICD-10-CM

## 2021-06-25 DIAGNOSIS — E785 Hyperlipidemia, unspecified: Secondary | ICD-10-CM

## 2021-06-25 MED ORDER — ATORVASTATIN CALCIUM 20 MG PO TABS
20.0000 mg | ORAL_TABLET | Freq: Every day | ORAL | 3 refills | Status: DC
  Filled 2021-06-25: qty 90, 90d supply, fill #0
  Filled 2021-10-02: qty 90, 90d supply, fill #1
  Filled 2021-12-31: qty 90, 90d supply, fill #2
  Filled 2022-04-02: qty 90, 90d supply, fill #3

## 2021-06-25 MED ORDER — AMLODIPINE BESYLATE 5 MG PO TABS
5.0000 mg | ORAL_TABLET | Freq: Every day | ORAL | 3 refills | Status: DC
  Filled 2021-06-25: qty 90, 90d supply, fill #0
  Filled 2021-09-17 – 2021-10-02 (×2): qty 90, 90d supply, fill #1
  Filled 2021-12-31: qty 90, 90d supply, fill #2
  Filled 2022-04-02: qty 90, 90d supply, fill #3

## 2021-06-25 MED ORDER — OMEPRAZOLE 20 MG PO CPDR
20.0000 mg | DELAYED_RELEASE_CAPSULE | Freq: Every morning | ORAL | 0 refills | Status: DC
  Filled 2021-06-25: qty 90, 90d supply, fill #0

## 2021-07-03 DIAGNOSIS — H9312 Tinnitus, left ear: Secondary | ICD-10-CM | POA: Diagnosis not present

## 2021-07-03 DIAGNOSIS — H903 Sensorineural hearing loss, bilateral: Secondary | ICD-10-CM | POA: Diagnosis not present

## 2021-07-06 DIAGNOSIS — H9312 Tinnitus, left ear: Secondary | ICD-10-CM | POA: Diagnosis not present

## 2021-07-06 DIAGNOSIS — H9122 Sudden idiopathic hearing loss, left ear: Secondary | ICD-10-CM | POA: Diagnosis not present

## 2021-07-07 ENCOUNTER — Other Ambulatory Visit: Payer: Self-pay | Admitting: Otolaryngology

## 2021-07-07 DIAGNOSIS — H9122 Sudden idiopathic hearing loss, left ear: Secondary | ICD-10-CM

## 2021-07-09 ENCOUNTER — Telehealth: Payer: Self-pay | Admitting: Emergency Medicine

## 2021-07-09 NOTE — Telephone Encounter (Signed)
Jinny Blossom called and states this is a mutual pt.  ? ?States that Dr. Geraldine Solar to start pt on 4 day Prednisone taper pack for hearing loss.  ? ?However, the pt has diabetes and wanted to get it cleared by PCP. ? ?Please call/ LVM at [phone]. ?

## 2021-07-09 NOTE — Telephone Encounter (Signed)
Okay to do so.  Thanks.

## 2021-07-09 NOTE — Telephone Encounter (Signed)
Called Megan at [facility]NT and left message on secure voice mail of provider response to medication question ?

## 2021-07-10 NOTE — Telephone Encounter (Signed)
Sorry, due to pt hx of DM , I would not feel comfortable with OK for this treatment.   ?

## 2021-07-10 NOTE — Telephone Encounter (Signed)
Called Megan at [facility] and left message on a secure voice mail with provider response to medication question  ?

## 2021-07-10 NOTE — Telephone Encounter (Signed)
Megan calls back with [facility] and the doctor there is actually wanting PT to do 10 Days of 60 mg Prednisone with a taper off 4 days. I had let her know that Dr.Sagardia was out of the office but that someone should still be able to give an okay on this matter! ? ?CB: [phone]  ?

## 2021-07-13 ENCOUNTER — Other Ambulatory Visit: Payer: Self-pay | Admitting: Endocrinology

## 2021-07-13 ENCOUNTER — Other Ambulatory Visit (HOSPITAL_COMMUNITY): Payer: Self-pay

## 2021-07-13 MED ORDER — METFORMIN HCL ER 500 MG PO TB24
1000.0000 mg | ORAL_TABLET | Freq: Every day | ORAL | 0 refills | Status: DC
  Filled 2021-07-13: qty 180, 90d supply, fill #0

## 2021-07-22 ENCOUNTER — Ambulatory Visit: Payer: 59 | Admitting: Endocrinology

## 2021-07-31 DIAGNOSIS — H2513 Age-related nuclear cataract, bilateral: Secondary | ICD-10-CM | POA: Diagnosis not present

## 2021-07-31 DIAGNOSIS — H04123 Dry eye syndrome of bilateral lacrimal glands: Secondary | ICD-10-CM | POA: Diagnosis not present

## 2021-07-31 DIAGNOSIS — E113293 Type 2 diabetes mellitus with mild nonproliferative diabetic retinopathy without macular edema, bilateral: Secondary | ICD-10-CM | POA: Diagnosis not present

## 2021-07-31 DIAGNOSIS — H40013 Open angle with borderline findings, low risk, bilateral: Secondary | ICD-10-CM | POA: Diagnosis not present

## 2021-08-20 ENCOUNTER — Other Ambulatory Visit (HOSPITAL_COMMUNITY): Payer: Self-pay

## 2021-08-24 ENCOUNTER — Other Ambulatory Visit (HOSPITAL_COMMUNITY): Payer: Self-pay

## 2021-08-24 ENCOUNTER — Other Ambulatory Visit: Payer: Self-pay | Admitting: Emergency Medicine

## 2021-08-24 DIAGNOSIS — G6289 Other specified polyneuropathies: Secondary | ICD-10-CM

## 2021-08-24 MED ORDER — GABAPENTIN 100 MG PO CAPS
100.0000 mg | ORAL_CAPSULE | Freq: Two times a day (BID) | ORAL | 0 refills | Status: DC
  Filled 2021-08-24: qty 180, 90d supply, fill #0

## 2021-08-25 ENCOUNTER — Other Ambulatory Visit: Payer: Self-pay

## 2021-08-26 ENCOUNTER — Other Ambulatory Visit (HOSPITAL_COMMUNITY): Payer: Self-pay

## 2021-08-28 ENCOUNTER — Other Ambulatory Visit (HOSPITAL_COMMUNITY): Payer: Self-pay

## 2021-08-31 ENCOUNTER — Other Ambulatory Visit: Payer: Self-pay | Admitting: Emergency Medicine

## 2021-08-31 ENCOUNTER — Other Ambulatory Visit (HOSPITAL_COMMUNITY): Payer: Self-pay

## 2021-08-31 MED ORDER — UNIFINE PENTIPS 31G X 8 MM MISC
3 refills | Status: DC
  Filled 2021-08-31: qty 100, 50d supply, fill #0

## 2021-09-16 ENCOUNTER — Other Ambulatory Visit (HOSPITAL_COMMUNITY): Payer: Self-pay

## 2021-09-16 ENCOUNTER — Other Ambulatory Visit (HOSPITAL_COMMUNITY): Payer: Self-pay | Admitting: Physician Assistant

## 2021-09-16 MED ORDER — FLUTICASONE PROPIONATE 50 MCG/ACT NA SUSP
1.0000 | Freq: Every day | NASAL | 0 refills | Status: AC
  Filled 2021-09-16: qty 16, 30d supply, fill #0

## 2021-09-17 ENCOUNTER — Other Ambulatory Visit (HOSPITAL_COMMUNITY): Payer: Self-pay

## 2021-09-25 ENCOUNTER — Other Ambulatory Visit (HOSPITAL_COMMUNITY): Payer: Self-pay

## 2021-09-28 ENCOUNTER — Encounter: Payer: Self-pay | Admitting: Emergency Medicine

## 2021-09-28 ENCOUNTER — Ambulatory Visit (INDEPENDENT_AMBULATORY_CARE_PROVIDER_SITE_OTHER): Payer: 59 | Admitting: Emergency Medicine

## 2021-09-28 VITALS — BP 140/78 | HR 67 | Temp 98.3°F | Ht 74.0 in | Wt 273.4 lb

## 2021-09-28 DIAGNOSIS — Z1211 Encounter for screening for malignant neoplasm of colon: Secondary | ICD-10-CM

## 2021-09-28 DIAGNOSIS — Z23 Encounter for immunization: Secondary | ICD-10-CM

## 2021-09-28 DIAGNOSIS — E1169 Type 2 diabetes mellitus with other specified complication: Secondary | ICD-10-CM

## 2021-09-28 DIAGNOSIS — E785 Hyperlipidemia, unspecified: Secondary | ICD-10-CM | POA: Diagnosis not present

## 2021-09-28 DIAGNOSIS — Z13220 Encounter for screening for lipoid disorders: Secondary | ICD-10-CM | POA: Diagnosis not present

## 2021-09-28 DIAGNOSIS — Z0000 Encounter for general adult medical examination without abnormal findings: Secondary | ICD-10-CM | POA: Diagnosis not present

## 2021-09-28 DIAGNOSIS — Z130 Encounter for screening for diseases of the blood and blood-forming organs and certain disorders involving the immune mechanism: Secondary | ICD-10-CM | POA: Diagnosis not present

## 2021-09-28 DIAGNOSIS — I152 Hypertension secondary to endocrine disorders: Secondary | ICD-10-CM | POA: Diagnosis not present

## 2021-09-28 DIAGNOSIS — Z125 Encounter for screening for malignant neoplasm of prostate: Secondary | ICD-10-CM

## 2021-09-28 DIAGNOSIS — E1159 Type 2 diabetes mellitus with other circulatory complications: Secondary | ICD-10-CM

## 2021-09-28 DIAGNOSIS — Z1329 Encounter for screening for other suspected endocrine disorder: Secondary | ICD-10-CM | POA: Diagnosis not present

## 2021-09-28 DIAGNOSIS — Z13228 Encounter for screening for other metabolic disorders: Secondary | ICD-10-CM

## 2021-09-28 LAB — CBC WITH DIFFERENTIAL/PLATELET
Basophils Absolute: 0.1 10*3/uL (ref 0.0–0.1)
Basophils Relative: 0.7 % (ref 0.0–3.0)
Eosinophils Absolute: 0.2 10*3/uL (ref 0.0–0.7)
Eosinophils Relative: 3 % (ref 0.0–5.0)
HCT: 45.4 % (ref 39.0–52.0)
Hemoglobin: 15.3 g/dL (ref 13.0–17.0)
Lymphocytes Relative: 22.6 % (ref 12.0–46.0)
Lymphs Abs: 1.8 10*3/uL (ref 0.7–4.0)
MCHC: 33.8 g/dL (ref 30.0–36.0)
MCV: 90.4 fl (ref 78.0–100.0)
Monocytes Absolute: 0.5 10*3/uL (ref 0.1–1.0)
Monocytes Relative: 6.6 % (ref 3.0–12.0)
Neutro Abs: 5.4 10*3/uL (ref 1.4–7.7)
Neutrophils Relative %: 67.1 % (ref 43.0–77.0)
Platelets: 130 10*3/uL — ABNORMAL LOW (ref 150.0–400.0)
RBC: 5.02 Mil/uL (ref 4.22–5.81)
RDW: 13.6 % (ref 11.5–15.5)
WBC: 8 10*3/uL (ref 4.0–10.5)

## 2021-09-28 LAB — POCT GLYCOSYLATED HEMOGLOBIN (HGB A1C): Hemoglobin A1C: 6.8 % — AB (ref 4.0–5.6)

## 2021-09-28 LAB — PSA: PSA: 0.29 ng/mL (ref 0.10–4.00)

## 2021-09-28 NOTE — Progress Notes (Signed)
James Garcia 66 y.o.   Chief Complaint  Patient presents with   Annual Exam   Medication Management    Pt wants to change his rx [facility]     HISTORY OF PRESENT ILLNESS: This is a 66 y.o. male here for annual exam. History of diabetes, hypertension, and dyslipidemia. Compliant with diet and medications. Overall doing well. Physically active at work. Non-smoker. No complaints or medical concerns today.  HPI   Prior to Admission medications   Medication Sig Start Date End Date Taking? Authorizing Provider  amLODipine (NORVASC) 5 MG tablet Take 1 tablet (5 mg total) by mouth daily. 06/25/21  Yes Kayden Hutmacher, Ines Bloomer, MD  atorvastatin (LIPITOR) 20 MG tablet Take 1 tablet (20 mg total) by mouth daily. 06/25/21  Yes Kamoria Lucien, Ines Bloomer, MD  Blood Glucose Monitoring Suppl (FREESTYLE LITE) w/Device KIT Use to test blood sugar daily 03/23/21  Yes Renato Shin, MD  Blood Pressure Monitor KIT Use as instructed for HTN 07/12/17  Yes Wendie Agreste, MD  cetirizine (ZYRTEC) 10 MG tablet Take 10 mg by mouth daily. 08/30/19  Yes [provider]  fluticasone (FLONASE) 50 MCG/ACT nasal spray Place 1-2 sprays into both nostrils daily. 09/16/21  Yes Raspet, Erin K, PA-C  gabapentin (NEURONTIN) 100 MG capsule Take 1 capsule (100 mg total) by mouth 2 (two) times daily. 08/24/21  Yes Matsuko Kretz, Ines Bloomer, MD  glucose blood (FREESTYLE LITE) test strip Use as instructed 2 times daily 06/17/21  Yes Renato Shin, MD  insulin glargine-yfgn (SEMGLEE, YFGN,) 100 UNIT/ML Pen Inject 55 Units into the skin daily. 05/18/21  Yes Renato Shin, MD  Insulin Pen Needle (UNIFINE PENTIPS) 31G X 8 MM MISC USE WITH INSULIN TWICE DAILY AS DIRECTED 08/31/21 08/31/22 Yes Freyja Govea, Ines Bloomer, MD  Lancets (FREESTYLE) lancets USE TO CHECK BLOOD SUGAR 2 TIMES PER DAY. 12/18/20  Yes Brya Simerly, Ines Bloomer, MD  losartan (COZAAR) 100 MG tablet Take 1 tablet (100 mg total) by mouth daily. 11/30/17  Yes Wendie Agreste, MD   metFORMIN (GLUCOPHAGE-XR) 500 MG 24 hr tablet Take 2 tablets (1,000 mg total) by mouth daily with breakfast. 07/13/21  Yes Renato Shin, MD  omeprazole (PRILOSEC) 20 MG capsule Take 1 capsule (20 mg total) by mouth every morning. 06/25/21  Yes Jensine Luz, Ines Bloomer, MD  Semaglutide, 2 MG/DOSE, (OZEMPIC, 2 MG/DOSE,) 8 MG/3ML SOPN Inject 2 mg into the skin once a week. 12/08/20  Yes Renato Shin, MD  UNIFINE PENTIPS 31G X 8 MM MISC USE WITH INSULIN TWICE DAILY AS DIRECTED 09/13/19  Yes [provider]  COVID-19 mRNA Vac-TriS, Pfizer, SUSP injection Inject into the muscle. Patient not taking: Reported on 09/28/2021 09/03/20   Carlyle Basques, MD  diazepam (VALIUM) 5 MG tablet Take 2 tablets (10 mg total) by mouth 1 hour before MRI may take other tab as needed, no driving Patient not taking: Reported on 09/28/2021 12/23/20     neomycin-bacitracin-polymyxin (NEOSPORIN) ointment Apply 1 application topically every 12 (twelve) hours. Patient not taking: Reported on 09/28/2021 09/20/19   Rosemarie Ax, MD  pramipexole (MIRAPEX) 0.5 MG tablet Take 1 tablet (0.5 mg total) by mouth 3 (three) times daily. Patient not taking: Reported on 09/28/2021 11/28/19   Sater, Nanine Means, MD  albuterol (PROVENTIL HFA;VENTOLIN HFA) 108 (90 Base) MCG/ACT inhaler Inhale 1-2 puffs into the lungs every 4 (four) hours as needed for wheezing or shortness of breath. 12/24/17 08/29/19  Wendie Agreste, MD    Allergies  Allergen Reactions  Insulin Glargine Hives    specifically Semglee    Patient Active Problem List   Diagnosis Date Noted   Paraumbilical hernia 01/77/9390   Calculus of gallbladder without cholecystitis without obstruction 12/01/2020   Diverticulosis 12/01/2020   Atherosclerosis of aorta ([place]) 12/01/2020   Hypertension associated with diabetes ([place]) 11/10/2020   Restless leg 11/28/2019   Dyslipidemia associated with type 2 diabetes mellitus ([place]) 05/01/2015    Past Medical History:  Diagnosis Date    Constipation    uses OTC laxatives - hard stools every morning- small amount and feels like doesnt empty    Diabetes mellitus without complication (HCC)    DM type 2 (diabetes mellitus, type 2) (HCC)    Hyperlipidemia    Hypertension    Leg cramps    Tuberculin skin test (TST) positive    Tuberculosis    pt states was treated for TB    Past Surgical History:  Procedure Laterality Date   arm surgery     COLONOSCOPY     ELBOW SURGERY     FOOT SURGERY Bilateral    POLYPECTOMY      Social History   Socioeconomic History   Marital status: Married    Spouse name: Not on file   Number of children: 2   Years of education: Not on file   Highest education level: Not on file  Occupational History   Occupation: Floor Tech at [employer]   Tobacco Use   Smoking status: Former    Packs/day: 0.25    Years: 5.00    Total pack years: 1.25    Types: Cigarettes    Quit date: 06/04/1978    Years since quitting: 43.3   Smokeless tobacco: Never  Vaping Use   Vaping Use: Never used  Substance and Sexual Activity   Alcohol use: No   Drug use: No   Sexual activity: Not on file  Other Topics Concern   Not on file  Social History Narrative   Lives with family       Social Determinants of Health   Financial Resource Strain: Not on file  Food Insecurity: Not on file  Transportation Needs: Not on file  Physical Activity: Not on file  Stress: Not on file  Social Connections: Not on file  Intimate Partner Violence: Not on file    Family History  Problem Relation Age of Onset   Diabetes Father    Colon cancer Neg Hx    Colon polyps Neg Hx    Rectal cancer Neg Hx    Stomach cancer Neg Hx      Review of Systems  Constitutional: Negative.  Negative for chills and fever.  HENT: Negative.  Negative for congestion and sore throat.   Eyes: Negative.   Respiratory: Negative.  Negative for cough and shortness of breath.   Cardiovascular: Negative.  Negative for chest pain and  palpitations.  Gastrointestinal: Negative.  Negative for abdominal pain, diarrhea, nausea and vomiting.  Genitourinary: Negative.  Negative for dysuria and hematuria.  Musculoskeletal: Negative.   Skin: Negative.  Negative for rash.  Neurological:  Negative for dizziness and headaches.  Endo/Heme/Allergies: Negative.   All other systems reviewed and are negative.  Today's Vitals   09/28/21 1453  BP: 140/78  Pulse: 67  Temp: 98.3 F (36.8 C)  TempSrc: Oral  SpO2: 95%  Weight: 273 lb 6 oz (124 kg)  Height: '6\' 2"'  (1.88 m)   Body mass index is 35.1 kg/m.   Physical Exam Vitals  reviewed.  Constitutional:      Appearance: Normal appearance. He is obese.  HENT:     Head: Normocephalic.     Right Ear: Tympanic membrane, ear canal and external ear normal.     Left Ear: Tympanic membrane, ear canal and external ear normal.     Mouth/Throat:     Mouth: Mucous membranes are moist.     Pharynx: Oropharynx is clear.  Eyes:     Extraocular Movements: Extraocular movements intact.     Conjunctiva/sclera: Conjunctivae normal.     Pupils: Pupils are equal, round, and reactive to light.  Cardiovascular:     Pulses: Normal pulses.     Heart sounds: Normal heart sounds.  Pulmonary:     Effort: Pulmonary effort is normal.     Breath sounds: Normal breath sounds.  Abdominal:     General: There is no distension.     Palpations: Abdomen is soft.     Tenderness: There is no abdominal tenderness.  Musculoskeletal:        General: Normal range of motion.     Right lower leg: No edema.     Left lower leg: No edema.  Skin:    Capillary Refill: Capillary refill takes less than 2 seconds.  Neurological:     General: No focal deficit present.     Mental Status: He is alert and oriented to person, place, and time.  Psychiatric:        Mood and Affect: Mood normal.        Behavior: Behavior normal.    Results for orders placed or performed in visit on 09/28/21 (from the past 24 hour(s))   POCT HgB A1C     Status: Abnormal   Collection Time: 09/28/21  3:22 PM  Result Value Ref Range   Hemoglobin A1C 6.8 (A) 4.0 - 5.6 %   HbA1c POC (<> result, manual entry)     HbA1c, POC (prediabetic range)     HbA1c, POC (controlled diabetic range)       ASSESSMENT & PLAN: Problem List Items Addressed This Visit       Cardiovascular and Mediastinum   Hypertension associated with diabetes ([place])   Relevant Orders   POCT HgB A1C (Completed)   Comprehensive metabolic panel   Urine Microalbumin w/creat. ratio     Endocrine   Dyslipidemia associated with type 2 diabetes mellitus ([place])   Relevant Orders   Comprehensive metabolic panel   Lipid panel   Other Visit Diagnoses     Routine general medical examination at a health care facility    -  Primary   Need for vaccination       Relevant Orders   Varicella-zoster vaccine IM (Shingrix) (Completed)   Prostate cancer screening       Relevant Orders   PSA(Must document that pt has been informed of limitations of PSA testing.)   Colon cancer screening       Relevant Orders   Ambulatory referral to Gastroenterology   Screening for deficiency anemia       Relevant Orders   CBC with Differential   Screening for lipoid disorders       Screening for endocrine, metabolic and immunity disorder       Relevant Orders   Comprehensive metabolic panel   Need for shingles vaccine       Relevant Orders   Varicella-zoster vaccine IM (Shingrix) (Completed)      Modifiable risk factors discussed with patient. Anticipatory guidance according  to age provided. The following topics were also discussed: Social Determinants of Health Smoking.  Non-smoker Diet and nutrition and need to decrease amount of daily carbohydrate intake and daily calories Benefits of exercise Cancer screening and need for colon cancer screening with colonoscopy Vaccinations recommendations Cardiovascular risk assessment The 10-year ASCVD risk score (Arnett DK, et  al., 2019) is: 27.5%   Values used to calculate the score:     Age: 44 years     Sex: Male     Is Non-Hispanic African American: No     Diabetic: Yes     Tobacco smoker: No     Systolic Blood Pressure: 875 mmHg     Is BP treated: Yes     HDL Cholesterol: 51 mg/dL     Total Cholesterol: 153 mg/dL Review of chronic medical problems and their management Review of all medications Mental health including depression and anxiety Fall and accident prevention  Patient Instructions  Mantenimiento de Technical sales engineer en los hombres Health Maintenance, Male Adoptar un estilo de vida saludable y recibir atencin preventiva son importantes para promover la salud y Musician. Consulte al mdico sobre: El esquema adecuado para hacerse pruebas y exmenes peridicos. Cosas que puede hacer por su cuenta para prevenir enfermedades y [place] sano. Qu debo saber sobre la dieta, el peso y el ejercicio? Consuma una dieta saludable  Consuma una dieta que incluya muchas verduras, frutas, productos lcteos con bajo contenido de [place] y Advertising account planner. No consuma muchos alimentos ricos en grasas slidas, azcares agregados o sodio. Mantenga un peso saludable El ndice de masa muscular [facility]) es una medida que puede utilizarse para identificar posibles problemas de [place]. Proporciona una estimacin de la grasa corporal basndose en el peso y la altura. Su mdico puede ayudarle a Radiation protection practitioner [place] y a Scientist, forensic o Theatre manager un peso saludable. Haga ejercicio con regularidad Haga ejercicio con regularidad. Esta es una de las prcticas ms importantes que puede hacer por su salud. La [employer] de los adultos deben seguir estas pautas: Optometrist, al menos, 150 minutos de actividad fsica por semana. El ejercicio debe aumentar la frecuencia cardaca y Nature conservation officer transpirar (ejercicio de intensidad moderada). Hacer ejercicios de fortalecimiento por lo [employer] por semana. Agregue esto a su plan de ejercicio de intensidad  moderada. Pase menos tiempo sentado. Incluso la actividad fsica ligera puede ser beneficiosa. Controle sus niveles de colesterol y lpidos en la sangre Comience a realizarse anlisis de lpidos y Research officer, trade union en la sangre a los 50 aos y luego reptalos cada 5 aos. Es posible que Automotive engineer los niveles de colesterol con mayor frecuencia si: Sus niveles de lpidos y colesterol son altos. Es mayor de 18 aos. Presenta un alto riesgo de padecer enfermedades cardacas. Qu debo saber sobre las pruebas de deteccin del cncer? Muchos tipos de cncer pueden detectarse de manera temprana y, a menudo, pueden prevenirse. Segn su historia clnica y sus antecedentes familiares, es posible que deba realizarse pruebas de deteccin del cncer en diferentes edades. Esto puede incluir pruebas de deteccin de lo siguiente: Surveyor, minerals. Cncer de prstata. Cncer de piel. Cncer de pulmn. Qu debo saber sobre la enfermedad cardaca, la diabetes y la hipertensin arterial? Presin arterial y enfermedad cardaca La hipertensin arterial causa enfermedades cardacas y [place] el riesgo de accidente cerebrovascular. Es ms probable que esto se manifieste en las personas que tienen lecturas de presin arterial alta o tienen sobrepeso. Hable con el mdico sobre sus valores de presin arterial deseados. Hgase controlar  la presin arterial: Cada 3 a 5 aos si tiene entre 18 y 31 aos. Todos los aos si es mayor de 40 aos. Si tiene entre 38 y 57 aos y es fumador o Insurance account manager, pregntele al mdico si debe realizarse una prueba de deteccin de aneurisma artico abdominal (AAA) por nica vez. Diabetes Realcese exmenes de deteccin de la diabetes con regularidad. Este anlisis revisa el nivel de azcar en la sangre en [place]. Hgase las pruebas de deteccin: Cada tres aos despus de los 58 aos de edad si tiene un peso normal y un bajo riesgo de padecer diabetes. Con ms frecuencia y a partir de  [place] edad inferior si tiene sobrepeso o un alto riesgo de padecer diabetes. Qu debo saber sobre la prevencin de infecciones? Hepatitis B Si tiene un riesgo ms alto de contraer hepatitis B, debe someterse a un examen de deteccin de este virus. Hable con el mdico para averiguar si tiene riesgo de contraer la infeccin por hepatitis B. Hepatitis C Se recomienda un anlisis de [place] para: Todos los que nacieron entre 1945 y [phone]. Todas las personas que tengan un riesgo de haber contrado hepatitis C. Enfermedades de transmisin sexual (ETS) Debe realizarse pruebas de deteccin de ITS todos los aos, incluidas la gonorrea y la clamidia, si: Es sexualmente activo y es menor de 30 aos. Es mayor de 43 aos, y Investment banker, operational informa que corre riesgo de tener este tipo de infecciones. La actividad sexual ha cambiado desde que le hicieron la ltima prueba de deteccin y tiene un riesgo mayor de Best boy clamidia o Radio broadcast assistant. Pregntele al mdico si usted tiene riesgo. Pregntele al mdico si usted tiene un alto riesgo de Museum/gallery curator VIH. El mdico tambin puede recomendarle un medicamento recetado para ayudar a evitar la infeccin por el VIH. Si elige tomar medicamentos para prevenir el VIH, primero debe [employer] de deteccin del VIH. Luego debe hacerse anlisis cada 3 meses mientras est tomando los medicamentos. Siga estas indicaciones en su casa: Consumo de alcohol No beba alcohol si el mdico se lo prohbe. Si bebe alcohol: Limite la cantidad que consume de 0 a 2 bebidas por da. Sepa cunta cantidad de alcohol hay en las bebidas que toma. En los [place], una medida equivale a una botella de cerveza de 12 oz (355 ml), un vaso de vino de 5 oz (148 ml) o un vaso de una bebida alcohlica de alta graduacin de 1 oz (44 ml). Estilo de vida No consuma ningn producto que contenga nicotina o tabaco. Estos productos incluyen cigarrillos, tabaco para Higher education careers adviser y aparatos de vapeo, como los  Psychologist, sport and exercise. Si necesita ayuda para dejar de consumir estos productos, consulte al mdico. No consuma drogas. No comparta agujas. Solicite ayuda a su mdico si necesita apoyo o informacin para abandonar las drogas. Indicaciones generales Realcese los estudios de rutina de la salud, dentales y de Public librarian. [place]. Infrmele a su mdico si: Se siente deprimido con frecuencia. Alguna vez ha sido vctima de [place] o no se siente seguro en su casa. Resumen Adoptar un estilo de vida saludable y recibir atencin preventiva son importantes para promover la salud y Musician. Siga las instrucciones del mdico acerca de una dieta saludable, el ejercicio y la realizacin de pruebas o exmenes para Engineer, building services. Siga las instrucciones del mdico con respecto al control del colesterol y la presin arterial. Esta informacin no tiene Marine scientist el consejo del mdico. Asegrese de  hacerle al mdico cualquier pregunta que tenga. Document Revised: 08/06/2020 Document Reviewed: 08/06/2020 Elsevier Patient Education  [place], MD [facility]

## 2021-09-28 NOTE — Patient Instructions (Signed)
Mantenimiento de la salud en los hombres Health Maintenance, Male Adoptar un estilo de vida saludable y recibir atencin preventiva son importantes para promover la salud y el bienestar. Consulte al mdico sobre: El esquema adecuado para hacerse pruebas y exmenes peridicos. Cosas que puede hacer por su cuenta para prevenir enfermedades y mantenerse sano. Qu debo saber sobre la dieta, el peso y el ejercicio? Consuma una dieta saludable  Consuma una dieta que incluya muchas verduras, frutas, productos lcteos con bajo contenido de grasa y protenas magras. No consuma muchos alimentos ricos en grasas slidas, azcares agregados o sodio. Mantenga un peso saludable El ndice de masa muscular (IMC) es una medida que puede utilizarse para identificar posibles problemas de peso. Proporciona una estimacin de la grasa corporal basndose en el peso y la altura. Su mdico puede ayudarle a determinar su IMC y a lograr o mantener un peso saludable. Haga ejercicio con regularidad Haga ejercicio con regularidad. Esta es una de las prcticas ms importantes que puede hacer por su salud. La mayora de los adultos deben seguir estas pautas: Realizar, al menos, 150 minutos de actividad fsica por semana. El ejercicio debe aumentar la frecuencia cardaca y hacerlo transpirar (ejercicio de intensidad moderada). Hacer ejercicios de fortalecimiento por lo menos dos veces por semana. Agregue esto a su plan de ejercicio de intensidad moderada. Pase menos tiempo sentado. Incluso la actividad fsica ligera puede ser beneficiosa. Controle sus niveles de colesterol y lpidos en la sangre Comience a realizarse anlisis de lpidos y colesterol en la sangre a los 20 aos y luego reptalos cada 5 aos. Es posible que necesite controlar los niveles de colesterol con mayor frecuencia si: Sus niveles de lpidos y colesterol son altos. Es mayor de 40 aos. Presenta un alto riesgo de padecer enfermedades cardacas. Qu debo  saber sobre las pruebas de deteccin del cncer? Muchos tipos de cncer pueden detectarse de manera temprana y, a menudo, pueden prevenirse. Segn su historia clnica y sus antecedentes familiares, es posible que deba realizarse pruebas de deteccin del cncer en diferentes edades. Esto puede incluir pruebas de deteccin de lo siguiente: Cncer colorrectal. Cncer de prstata. Cncer de piel. Cncer de pulmn. Qu debo saber sobre la enfermedad cardaca, la diabetes y la hipertensin arterial? Presin arterial y enfermedad cardaca La hipertensin arterial causa enfermedades cardacas y aumenta el riesgo de accidente cerebrovascular. Es ms probable que esto se manifieste en las personas que tienen lecturas de presin arterial alta o tienen sobrepeso. Hable con el mdico sobre sus valores de presin arterial deseados. Hgase controlar la presin arterial: Cada 3 a 5 aos si tiene entre 18 y 39 aos. Todos los aos si es mayor de 40 aos. Si tiene entre 65 y 75 aos y es fumador o sola fumar, pregntele al mdico si debe realizarse una prueba de deteccin de aneurisma artico abdominal (AAA) por nica vez. Diabetes Realcese exmenes de deteccin de la diabetes con regularidad. Este anlisis revisa el nivel de azcar en la sangre en ayunas. Hgase las pruebas de deteccin: Cada tres aos despus de los 45 aos de edad si tiene un peso normal y un bajo riesgo de padecer diabetes. Con ms frecuencia y a partir de una edad inferior si tiene sobrepeso o un alto riesgo de padecer diabetes. Qu debo saber sobre la prevencin de infecciones? Hepatitis B Si tiene un riesgo ms alto de contraer hepatitis B, debe someterse a un examen de deteccin de este virus. Hable con el mdico para averiguar si tiene riesgo de   contraer la infeccin por hepatitis B. Hepatitis C Se recomienda un anlisis de sangre para: Todos los que nacieron entre 1945 y 1965. Todas las personas que tengan un riesgo de haber  contrado hepatitis C. Enfermedades de transmisin sexual (ETS) Debe realizarse pruebas de deteccin de ITS todos los aos, incluidas la gonorrea y la clamidia, si: Es sexualmente activo y es menor de 24 aos. Es mayor de 24 aos, y el mdico le informa que corre riesgo de tener este tipo de infecciones. La actividad sexual ha cambiado desde que le hicieron la ltima prueba de deteccin y tiene un riesgo mayor de tener clamidia o gonorrea. Pregntele al mdico si usted tiene riesgo. Pregntele al mdico si usted tiene un alto riesgo de contraer VIH. El mdico tambin puede recomendarle un medicamento recetado para ayudar a evitar la infeccin por el VIH. Si elige tomar medicamentos para prevenir el VIH, primero debe hacerse los anlisis de deteccin del VIH. Luego debe hacerse anlisis cada 3 meses mientras est tomando los medicamentos. Siga estas indicaciones en su casa: Consumo de alcohol No beba alcohol si el mdico se lo prohbe. Si bebe alcohol: Limite la cantidad que consume de 0 a 2 bebidas por da. Sepa cunta cantidad de alcohol hay en las bebidas que toma. En los [place], una medida equivale a una botella de cerveza de 12 oz (355 ml), un vaso de vino de 5 oz (148 ml) o un vaso de una bebida alcohlica de alta graduacin de 1 oz (44 ml). Estilo de vida No consuma ningn producto que contenga nicotina o tabaco. Estos productos incluyen cigarrillos, tabaco para mascar y aparatos de vapeo, como los cigarrillos electrnicos. Si necesita ayuda para dejar de consumir estos productos, consulte al mdico. No consuma drogas. No comparta agujas. Solicite ayuda a su mdico si necesita apoyo o informacin para abandonar las drogas. Indicaciones generales Realcese los estudios de rutina de la salud, dentales y de la vista. Mantngase al da con las vacunas. Infrmele a su mdico si: Se siente deprimido con frecuencia. Alguna vez ha sido vctima de [place] o no se siente seguro en su  casa. Resumen Adoptar un estilo de vida saludable y recibir atencin preventiva son importantes para promover la salud y el bienestar. Siga las instrucciones del mdico acerca de una dieta saludable, el ejercicio y la realizacin de pruebas o exmenes para detectar enfermedades. Siga las instrucciones del mdico con respecto al control del colesterol y la presin arterial. Esta informacin no tiene como fin reemplazar el consejo del mdico. Asegrese de hacerle al mdico cualquier pregunta que tenga. Document Revised: 08/06/2020 Document Reviewed: 08/06/2020 Elsevier Patient Education  2023 Elsevier Inc.  

## 2021-09-29 LAB — LIPID PANEL
Cholesterol: 127 mg/dL (ref 0–200)
HDL: 49.2 mg/dL (ref >39.00)
LDL Cholesterol: 66 mg/dL (ref 0–99)
NonHDL: 77.66
Total CHOL/HDL Ratio: 3
Triglycerides: 58 mg/dL (ref 0.0–149.0)
VLDL: 11.6 mg/dL (ref 0.0–40.0)

## 2021-09-29 LAB — COMPREHENSIVE METABOLIC PANEL
ALT: 19 U/L (ref 0–53)
AST: 17 U/L (ref 0–37)
Albumin: 4.3 g/dL (ref 3.5–5.2)
Alkaline Phosphatase: 78 U/L (ref 39–117)
BUN: 15 mg/dL (ref 6–23)
CO2: 29 mEq/L (ref 19–32)
Calcium: 8.7 mg/dL (ref 8.4–10.5)
Chloride: 102 mEq/L (ref 96–112)
Creatinine, Ser: 0.79 mg/dL (ref 0.40–1.50)
GFR: 92.58 mL/min (ref >60.00)
Glucose, Bld: 83 mg/dL (ref 70–99)
Potassium: 4.3 mEq/L (ref 3.5–5.1)
Sodium: 140 mEq/L (ref 135–145)
Total Bilirubin: 0.6 mg/dL (ref 0.2–1.2)
Total Protein: 7 g/dL (ref 6.0–8.3)

## 2021-09-29 LAB — MICROALBUMIN / CREATININE URINE RATIO
Creatinine,U: 63.9 mg/dL
Microalb Creat Ratio: 1.1 mg/g (ref 0.0–30.0)
Microalb, Ur: <0.7 mg/dL (ref 0.0–1.9)

## 2021-10-02 ENCOUNTER — Other Ambulatory Visit (HOSPITAL_COMMUNITY): Payer: Self-pay

## 2021-10-15 ENCOUNTER — Other Ambulatory Visit (HOSPITAL_COMMUNITY): Payer: Self-pay

## 2021-10-15 ENCOUNTER — Other Ambulatory Visit: Payer: Self-pay

## 2021-10-19 ENCOUNTER — Other Ambulatory Visit (HOSPITAL_COMMUNITY): Payer: Self-pay

## 2021-10-19 ENCOUNTER — Other Ambulatory Visit: Payer: Self-pay | Admitting: Endocrinology

## 2021-10-19 MED ORDER — METFORMIN HCL ER 500 MG PO TB24
1000.0000 mg | ORAL_TABLET | Freq: Every day | ORAL | 0 refills | Status: DC
  Filled 2021-10-19: qty 180, 90d supply, fill #0

## 2021-10-30 ENCOUNTER — Encounter: Payer: Self-pay | Admitting: Internal Medicine

## 2021-11-03 ENCOUNTER — Encounter: Payer: Self-pay | Admitting: Internal Medicine

## 2021-11-03 ENCOUNTER — Ambulatory Visit (INDEPENDENT_AMBULATORY_CARE_PROVIDER_SITE_OTHER): Payer: 59 | Admitting: Internal Medicine

## 2021-11-03 ENCOUNTER — Other Ambulatory Visit: Payer: Self-pay

## 2021-11-03 VITALS — BP 130/88 | HR 70 | Ht 74.0 in | Wt 273.8 lb

## 2021-11-03 DIAGNOSIS — E1169 Type 2 diabetes mellitus with other specified complication: Secondary | ICD-10-CM | POA: Diagnosis not present

## 2021-11-03 DIAGNOSIS — Z794 Long term (current) use of insulin: Secondary | ICD-10-CM | POA: Diagnosis not present

## 2021-11-03 DIAGNOSIS — E1141 Type 2 diabetes mellitus with diabetic mononeuropathy: Secondary | ICD-10-CM

## 2021-11-03 DIAGNOSIS — E785 Hyperlipidemia, unspecified: Secondary | ICD-10-CM

## 2021-11-03 LAB — POCT GLYCOSYLATED HEMOGLOBIN (HGB A1C): Hemoglobin A1C: 7.1 % — AB (ref 4.0–5.6)

## 2021-11-03 MED ORDER — INSULIN PEN NEEDLE 32G X 4 MM MISC
3 refills | Status: DC
  Filled 2021-11-03: qty 100, fill #0
  Filled 2021-11-03: qty 100, 90d supply, fill #0
  Filled 2022-01-21: qty 100, 90d supply, fill #1
  Filled 2022-05-11: qty 100, 90d supply, fill #2
  Filled 2022-08-25: qty 100, 90d supply, fill #3

## 2021-11-03 MED ORDER — TRESIBA FLEXTOUCH 200 UNIT/ML ~~LOC~~ SOPN
50.0000 [IU] | PEN_INJECTOR | Freq: Every day | SUBCUTANEOUS | 3 refills | Status: DC
  Filled 2021-11-03: qty 9, fill #0
  Filled 2021-11-03: qty 9, 36d supply, fill #0
  Filled 2021-11-03: qty 6, 24d supply, fill #0
  Filled 2021-11-24: qty 6, 24d supply, fill #1
  Filled 2021-12-23: qty 6, 24d supply, fill #2
  Filled 2022-01-14: qty 6, 24d supply, fill #3
  Filled 2022-02-08: qty 6, 24d supply, fill #4
  Filled 2022-03-01: qty 6, 24d supply, fill #5

## 2021-11-03 NOTE — Patient Instructions (Addendum)
Please continue: - Metformin ER 1000 mg 2x a day, with meals - Ozempic 2 mg weekly  Stop Semglee and start: - Tresiba U200 50 units daily  Stop fried foods and reduce the snack before bed.  Please return in 4 months with your sugar log.   PATIENT INSTRUCTIONS FOR TYPE 2 DIABETES:  DIET AND EXERCISE Diet and exercise is an important part of diabetic treatment.  We recommended aerobic exercise in the form of brisk walking (working between 40-60% of maximal aerobic capacity, similar to brisk walking) for 150 minutes per week (such as 30 minutes five days per week) along with 3 times per week performing 'resistance' training (using various gauge rubber tubes with handles) 5-10 exercises involving the major muscle groups (upper body, lower body and core) performing 10-15 repetitions (or near fatigue) each exercise. Start at half the above goal but build slowly to reach the above goals. If limited by weight, joint pain, or disability, we recommend daily walking in a swimming pool with water up to waist to reduce pressure from joints while allow for adequate exercise.    BLOOD GLUCOSES Monitoring your blood glucoses is important for continued management of your diabetes. Please check your blood glucoses 2-4 times a day: fasting, before meals and at bedtime (you can rotate these measurements - e.g. one day check before the 3 meals, the next day check before 2 of the meals and before bedtime, etc.).   HYPOGLYCEMIA (low blood sugar) Hypoglycemia is usually a reaction to not eating, exercising, or taking too much insulin/ other diabetes drugs.  Symptoms include tremors, sweating, hunger, confusion, headache, etc. Treat IMMEDIATELY with 15 grams of Carbs: 4 glucose tablets  cup regular juice/soda 2 tablespoons raisins 4 teaspoons sugar 1 tablespoon honey Recheck blood glucose in 15 mins and repeat above if still symptomatic/blood glucose <100.  RECOMMENDATIONS TO REDUCE YOUR RISK OF DIABETIC  COMPLICATIONS: * Take your prescribed MEDICATION(S) * Follow a DIABETIC diet: Complex carbs, fiber rich foods, (monounsaturated and polyunsaturated) fats * AVOID saturated/trans fats, high fat foods, >2,300 mg salt per day. * EXERCISE at least 5 times a week for 30 minutes or preferably daily.  * DO NOT SMOKE OR DRINK more than 1 drink a day. * Check your FEET every day. Do not wear tightfitting shoes. Contact us if you develop an ulcer * See your EYE doctor once a year or more if needed * Get a FLU shot once a year * Get a PNEUMONIA vaccine once before and once after age 62 years  GOALS:  * Your Hemoglobin A1c of <7%  * fasting sugars need to be <130 * after meals sugars need to be <180 (2h after you start eating) * Your Systolic BP should be 324 or lower  * Your Diastolic BP should be 80 or lower  * Your HDL (Good Cholesterol) should be 40 or higher  * Your LDL (Bad Cholesterol) should be 100 or lower. * Your Triglycerides should be 150 or lower  * Your Urine microalbumin (kidney function) should be <30 * Your Body Mass Index should be 25 or lower    Please consider the following ways to cut down carbs and fat and increase fiber and micronutrients in your diet: - substitute whole grain for white bread or pasta - substitute brown rice for white rice - substitute 90-calorie flat bread pieces for slices of bread when possible - substitute sweet potatoes or yams for white potatoes - substitute humus for margarine - substitute tofu  for cheese when possible - substitute almond or rice milk for regular milk (would not drink soy milk daily due to concern for soy estrogen influence on breast cancer risk) - substitute dark chocolate for other sweets when possible - substitute water - can add lemon or orange slices for taste - for diet sodas (artificial sweeteners will trick your body that you can eat sweets without getting calories and will lead you to overeating and weight gain in the long  run) - do not skip breakfast or other meals (this will slow down the metabolism and will result in more weight gain over time)  - can try smoothies made from fruit and almond/rice milk in am instead of regular breakfast - can also try old-fashioned (not instant) oatmeal made with almond/rice milk in am - order the dressing on the side when eating salad at a restaurant (pour less than half of the dressing on the salad) - eat as little meat as possible - can try juicing, but should not forget that juicing will get rid of the fiber, so would alternate with eating raw veg./fruits or drinking smoothies - use as little oil as possible, even when using olive oil - can dress a salad with a mix of balsamic vinegar and lemon juice, for e.g. - use agave nectar, stevia sugar, or regular sugar rather than artificial sweateners - steam or broil/roast veggies  - snack on veggies/fruit/nuts (unsalted, preferably) when possible, rather than processed foods - reduce or eliminate aspartame in diet (it is in diet sodas, chewing gum, etc) Read the labels!  Try to read Dr. Janene Harvey book: "Program for Reversing Diabetes" for other ideas for healthy eating.

## 2021-11-03 NOTE — Progress Notes (Signed)
Patient ID: James Garcia, male   DOB: 16-May-1955, 66 y.o.   MRN: [id number]  HPI: James Garcia is a 66 y.o.-year-old male, returning for follow-up for DM2, dx in 2007, insulin-dependent since 2016, fairly well controlled, with complications (diabetic retinopathy, peripheral neuropathy). Pt. previously saw Dr. Loanne Drilling, last visit 7.5 mo ago.  Reviewed HbA1c: Lab Results  Component Value Date   HGBA1C 6.8 (A) 09/28/2021   HGBA1C 7.0 (A) 03/23/2021   HGBA1C 7.5 (A) 12/08/2020   HGBA1C 6.2 (A) 09/01/2020   HGBA1C 7.6 (A) 04/21/2020   HGBA1C 7.3 (H) 11/20/2019   HGBA1C 7.0 (A) 08/16/2019   HGBA1C 7.2 (A) 04/04/2019   HGBA1C 7.4 (A) 02/01/2019   HGBA1C 8.1 (A) 12/06/2018   Pt is on a regimen of: - Metformin ER 1000 mg 2x a day, with meals - Semglee 55 units in am - long needles (8 mm) - Ozempic 2 mg weekly He tried [place] >> rash.  Pt checks his sugars 2x a day and they are: - am: 94-173, 218 - 2h after b'fast: n/c - before lunch: n/c - 2h after lunch: n/c - before dinner: n/c - 2h after dinner: n/c - bedtime: 87, 100-207 (before his dinner, when he comes home from work) - nighttime: n/c Lowest sugar was 87; he has hypoglycemia awareness at 70s.  Highest sugar was 218.  Glucometer: Freestyle Lite  Pt's meals are: - Breakfast: fried eggs + toast and coffee - Lunch: skips - Dinner: 7-8 am: meat + veggies (cafeteria at [facility]) - bedtime snack: chicken or fried egg  - no CKD, last BUN/creatinine:  Lab Results  Component Value Date   BUN 15 09/28/2021   BUN 15 11/10/2020   CREATININE 0.79 09/28/2021   CREATININE 0.86 11/10/2020  On Cozaar 100 mg daily.  - + HL; last set of lipids: Lab Results  Component Value Date   CHOL 127 09/28/2021   HDL 49.20 09/28/2021   LDLCALC 66 09/28/2021   TRIG 58.0 09/28/2021   CHOLHDL 3 09/28/2021  On Lipitor 20 mg daily  - last eye exam was in 07/2021. + DR reportedly.   - + numbness and tingling in his feet.  Last  foot exam 12/08/2020. On Neurontin 200 mg 2x a day.  He has a history of HTN, constipation, history of gallbladder stone, history of TB.  ROS: + see HPI No increased urination, blurry vision, nausea, chest pain.  Past Medical History:  Diagnosis Date   Constipation    uses OTC laxatives - hard stools every morning- small amount and feels like doesnt empty    Diabetes mellitus without complication (HCC)    DM type 2 (diabetes mellitus, type 2) (HCC)    Hyperlipidemia    Hypertension    Leg cramps    Tuberculin skin test (TST) positive    Tuberculosis    pt states was treated for TB   Past Surgical History:  Procedure Laterality Date   arm surgery     COLONOSCOPY     ELBOW SURGERY     FOOT SURGERY Bilateral    POLYPECTOMY     Social History   Socioeconomic History   Marital status: Married    Spouse name: Not on file   Number of children: 2   Years of education: Not on file   Highest education level: Not on file  Occupational History   Occupation: Floor Tech at [employer]   Tobacco Use   Smoking status: Former  Packs/day: 0.25    Years: 5.00    Total pack years: 1.25    Types: Cigarettes    Quit date: 06/04/1978    Years since quitting: 43.4   Smokeless tobacco: Never  Vaping Use   Vaping Use: Never used  Substance and Sexual Activity   Alcohol use: No   Drug use: No   Sexual activity: Not on file  Other Topics Concern   Not on file  Social History Narrative   Lives with family       Social Determinants of Health   Financial Resource Strain: Not on file  Food Insecurity: Not on file  Transportation Needs: Not on file  Physical Activity: Not on file  Stress: Not on file  Social Connections: Not on file  Intimate Partner Violence: Not on file   Current Outpatient Medications on File Prior to Visit  Medication Sig Dispense Refill   amLODipine (NORVASC) 5 MG tablet Take 1 tablet (5 mg total) by mouth daily. 90 tablet 3   atorvastatin (LIPITOR) 20 MG tablet  Take 1 tablet (20 mg total) by mouth daily. 90 tablet 3   Blood Glucose Monitoring Suppl (FREESTYLE LITE) w/Device KIT Use to test blood sugar daily 1 kit 0   Blood Pressure Monitor KIT Use as instructed for HTN 1 each 0   cetirizine (ZYRTEC) 10 MG tablet Take 10 mg by mouth daily.     COVID-19 mRNA Vac-TriS, Pfizer, SUSP injection Inject into the muscle. (Patient not taking: Reported on 09/28/2021) 0.3 mL 0   diazepam (VALIUM) 5 MG tablet Take 2 tablets (10 mg total) by mouth 1 hour before MRI may take other tab as needed, no driving (Patient not taking: Reported on 09/28/2021) 3 tablet 0   fluticasone (FLONASE) 50 MCG/ACT nasal spray Place 1-2 sprays into both nostrils daily. 16 g 0   gabapentin (NEURONTIN) 100 MG capsule Take 1 capsule (100 mg total) by mouth 2 (two) times daily. 180 capsule 0   glucose blood (FREESTYLE LITE) test strip Use as instructed 2 times daily 200 each 12   insulin glargine-yfgn (SEMGLEE, YFGN,) 100 UNIT/ML Pen Inject 55 Units into the skin daily. 60 mL 3   Insulin Pen Needle (UNIFINE PENTIPS) 31G X 8 MM MISC USE WITH INSULIN TWICE DAILY AS DIRECTED 100 each 3   Lancets (FREESTYLE) lancets USE TO CHECK BLOOD SUGAR 2 TIMES PER DAY. 200 each 2   losartan (COZAAR) 100 MG tablet Take 1 tablet (100 mg total) by mouth daily. 90 tablet 0   metFORMIN (GLUCOPHAGE-XR) 500 MG 24 hr tablet Take 2 tablets (1,000 mg total) by mouth daily with breakfast. 180 tablet 0   neomycin-bacitracin-polymyxin (NEOSPORIN) ointment Apply 1 application topically every 12 (twelve) hours. (Patient not taking: Reported on 09/28/2021) 15 g 0   omeprazole (PRILOSEC) 20 MG capsule Take 1 capsule (20 mg total) by mouth every morning. 90 capsule 0   pramipexole (MIRAPEX) 0.5 MG tablet Take 1 tablet (0.5 mg total) by mouth 3 (three) times daily. (Patient not taking: Reported on 09/28/2021) 30 tablet 5   Semaglutide, 2 MG/DOSE, (OZEMPIC, 2 MG/DOSE,) 8 MG/3ML SOPN Inject 2 mg into the skin once a week. 9 mL 3    UNIFINE PENTIPS 31G X 8 MM MISC USE WITH INSULIN TWICE DAILY AS DIRECTED     [DISCONTINUED] albuterol (PROVENTIL HFA;VENTOLIN HFA) 108 (90 Base) MCG/ACT inhaler Inhale 1-2 puffs into the lungs every 4 (four) hours as needed for wheezing or shortness of breath. 1 Inhaler  0   Current Facility-Administered Medications on File Prior to Visit  Medication Dose Route Frequency Provider Last Rate Last Admin   0.9 %  sodium chloride infusion  500 mL Intravenous Once Pyrtle, Lajuan Lines, MD       Allergies  Allergen Reactions   Insulin Glargine Hives    specifically Semglee   Family History  Problem Relation Age of Onset   Diabetes Father    Colon cancer Neg Hx    Colon polyps Neg Hx    Rectal cancer Neg Hx    Stomach cancer Neg Hx     PE: BP 130/88 (BP Location: Left Arm, Patient Position: Sitting, Cuff Size: Normal)   Pulse 70   Ht 6' 2" (1.88 m)   Wt 273 lb 12.8 oz (124.2 kg)   SpO2 97%   BMI 35.15 kg/m  Wt Readings from Last 3 Encounters:  11/03/21 273 lb 12.8 oz (124.2 kg)  09/28/21 273 lb 6 oz (124 kg)  05/19/21 270 lb (122.5 kg)   Constitutional: overweight, in NAD Eyes: no exophthalmos ENT: moist mucous membranes, no thyromegaly, no cervical lymphadenopathy Cardiovascular: RRR, No MRG Respiratory: CTA B Musculoskeletal: + deformities - L elbow deformity after being dislocated and healing in the wrong position Skin: moist, warm, no rashes Neurological: no tremor with outstretched hands  ASSESSMENT: 1. DM2, insulin-dependent, uncontrolled, with complications - DR - PN  2. HL  PLAN:  1. Patient with long-standing, uncontrolled diabetes, on oral antidiabetic regimen with metformin and also weekly GLP-1 receptor agonist and daily long-acting insulin, with improving control.  Latest HbA1c was last month and this was better, at 6.8%, at goal. HbA1c was checked by mistake today: 7.1% (higher). -At today's visit, we reviewed his meter download.  Sugars are mostly at goal, but he  has occasional hyperglycemic spikes in the 190s and even 200s both in the morning and when he comes home from work, before his bedtime snack.  We discussed about improving his diet.  He eats 2-3 eggs a day when he eats breakfast but sometimes he skips breakfast and most of the time he skips lunch, also.  We discussed that anything fried will increase his blood sugars significantly.  I advised him to reduce fried foods and also reduce the fat in his diet to improve his insulin resistance.  Discussed about possibly having a lighter meal before he goes to bed, including a salad, fruit, half a sandwich, unsalted nuts, etc.  He will try this, however, he tells me that he is worried that his sugars may drop too much if he is not eating at bedtime.  Therefore, for now, I advised him to reduce the dose of his insulin.  Also, we will try to switch from [facility] to [place] in a more concentrated form, U200.  I explained that Tyler Aas is less likely to cause low blood sugars during the night. -Otherwise, for now, we can continue the same dose of Ozempic and metformin. - I suggested to:  Patient Instructions  Please continue: - Metformin ER 1000 mg 2x a day, with meals - Ozempic 2 mg weekly  Stop Semglee and start: - Tresiba U200 50 units daily  Stop fried foods and reduce the snack before bed.  Please return in 4 months with your sugar log.  - check sugars at different times of the day - check 2x a day, rotating checks - discussed about CBG targets for treatment: 80-130 mg/dL before meals and <180 mg/dL after meals; target HbA1c <  7%. - given foot care handout  - given instructions for hypoglycemia management "15-15 rule"  - advised for yearly eye exams  - He is planning to establish care with podiatry.  No further exams until next visit, we will check this together. - Return to clinic in 4 months  2. HL - Reviewed latest lipid panel from last month: LDL at goal, as are the rest of the fractions: Lab  Results  Component Value Date   CHOL 127 09/28/2021   HDL 49.20 09/28/2021   LDLCALC 66 09/28/2021   TRIG 58.0 09/28/2021   CHOLHDL 3 09/28/2021  - Continues Lipitor 20 mg daily without side effects.  Philemon Kingdom, MD PhD [facility] Endocrinology

## 2021-11-04 ENCOUNTER — Other Ambulatory Visit: Payer: Self-pay

## 2021-11-04 ENCOUNTER — Other Ambulatory Visit (HOSPITAL_COMMUNITY): Payer: Self-pay

## 2021-11-19 ENCOUNTER — Other Ambulatory Visit (HOSPITAL_COMMUNITY): Payer: Self-pay

## 2021-11-19 ENCOUNTER — Other Ambulatory Visit: Payer: Self-pay | Admitting: Emergency Medicine

## 2021-11-19 DIAGNOSIS — G6289 Other specified polyneuropathies: Secondary | ICD-10-CM

## 2021-11-19 MED ORDER — GABAPENTIN 100 MG PO CAPS
100.0000 mg | ORAL_CAPSULE | Freq: Two times a day (BID) | ORAL | 0 refills | Status: DC
  Filled 2021-11-19 – 2021-11-23 (×2): qty 180, 90d supply, fill #0

## 2021-11-23 ENCOUNTER — Other Ambulatory Visit (HOSPITAL_COMMUNITY): Payer: Self-pay

## 2021-11-23 ENCOUNTER — Other Ambulatory Visit: Payer: Self-pay | Admitting: Endocrinology

## 2021-11-23 MED ORDER — OZEMPIC (2 MG/DOSE) 8 MG/3ML ~~LOC~~ SOPN
2.0000 mg | PEN_INJECTOR | SUBCUTANEOUS | 3 refills | Status: DC
  Filled 2021-11-23: qty 9, 84d supply, fill #0
  Filled 2022-03-01: qty 3, 28d supply, fill #1
  Filled 2022-04-16 – 2022-05-03 (×3): qty 3, 28d supply, fill #2

## 2021-11-24 ENCOUNTER — Other Ambulatory Visit (HOSPITAL_COMMUNITY): Payer: Self-pay

## 2021-12-01 ENCOUNTER — Ambulatory Visit (AMBULATORY_SURGERY_CENTER): Payer: 59 | Admitting: *Deleted

## 2021-12-01 ENCOUNTER — Encounter: Payer: Self-pay | Admitting: Gastroenterology

## 2021-12-01 ENCOUNTER — Other Ambulatory Visit (HOSPITAL_COMMUNITY): Payer: Self-pay

## 2021-12-01 VITALS — Ht 74.0 in | Wt 273.2 lb

## 2021-12-01 DIAGNOSIS — Z86010 Personal history of colonic polyps: Secondary | ICD-10-CM

## 2021-12-01 MED ORDER — NA SULFATE-K SULFATE-MG SULF 17.5-3.13-1.6 GM/177ML PO SOLN
1.0000 | Freq: Once | ORAL | 0 refills | Status: AC
  Filled 2021-12-01: qty 354, 1d supply, fill #0

## 2021-12-01 NOTE — Progress Notes (Signed)
No egg or soy allergy known to patient  No issues known to pt with past sedation with any surgeries or procedures Patient denies ever being told they had issues or difficulty with intubation  No FH of Malignant Hyperthermia Pt is not on diet pills Pt is not on  home 02  Pt is not on blood thinners  Pt has issues with constipation  No A fib or A flutter Have any cardiac testing pending--no Pt instructed to use [email or web address] or GoodRx for a price reduction on prep    2 day prep given.

## 2021-12-14 ENCOUNTER — Ambulatory Visit (AMBULATORY_SURGERY_CENTER): Payer: 59 | Admitting: Internal Medicine

## 2021-12-14 ENCOUNTER — Encounter: Payer: Self-pay | Admitting: Internal Medicine

## 2021-12-14 VITALS — BP 139/77 | HR 67 | Temp 98.6°F | Resp 12 | Ht 74.0 in | Wt 273.2 lb

## 2021-12-14 DIAGNOSIS — I10 Essential (primary) hypertension: Secondary | ICD-10-CM | POA: Diagnosis not present

## 2021-12-14 DIAGNOSIS — Z860100 Personal history of colon polyps, unspecified: Secondary | ICD-10-CM

## 2021-12-14 DIAGNOSIS — Z09 Encounter for follow-up examination after completed treatment for conditions other than malignant neoplasm: Secondary | ICD-10-CM | POA: Diagnosis not present

## 2021-12-14 DIAGNOSIS — Z1211 Encounter for screening for malignant neoplasm of colon: Secondary | ICD-10-CM | POA: Diagnosis not present

## 2021-12-14 DIAGNOSIS — E119 Type 2 diabetes mellitus without complications: Secondary | ICD-10-CM | POA: Diagnosis not present

## 2021-12-14 DIAGNOSIS — D123 Benign neoplasm of transverse colon: Secondary | ICD-10-CM | POA: Diagnosis not present

## 2021-12-14 DIAGNOSIS — D122 Benign neoplasm of ascending colon: Secondary | ICD-10-CM | POA: Diagnosis not present

## 2021-12-14 MED ORDER — SODIUM CHLORIDE 0.9 % IV SOLN: 500.0000 mL | INTRAVENOUS | Status: DC

## 2021-12-14 NOTE — Progress Notes (Signed)
Called to room to assist during endoscopic procedure.  Patient ID and intended procedure confirmed with present staff. Received instructions for my participation in the procedure from the performing physician.  

## 2021-12-14 NOTE — Patient Instructions (Signed)
Handouts on polyps, diverticulosis, and hemorrhoids given to you today Await pathology results   YOU HAD AN ENDOSCOPIC PROCEDURE TODAY AT THE [facility]:   Refer to the procedure report that was given to you for any specific questions about what was found during the examination.  If the procedure report does not answer your questions, please call your gastroenterologist to clarify.  If you requested that your care partner not be given the details of your procedure findings, then the procedure report has been included in a sealed envelope for you to review at your convenience later.  YOU SHOULD EXPECT: Some feelings of bloating in the abdomen. Passage of more gas than usual.  Walking can help get rid of the air that was put into your GI tract during the procedure and reduce the bloating. If you had a lower endoscopy (such as a colonoscopy or flexible sigmoidoscopy) you may notice spotting of blood in your stool or on the toilet paper. If you underwent a bowel prep for your procedure, you may not have a normal bowel movement for a few days.  Please Note:  You might notice some irritation and congestion in your nose or some drainage.  This is from the oxygen used during your procedure.  There is no need for concern and it should clear up in a day or so.  SYMPTOMS TO REPORT IMMEDIATELY:  Following lower endoscopy (colonoscopy or flexible sigmoidoscopy):  Excessive amounts of blood in the stool  Significant tenderness or worsening of abdominal pains  Swelling of the abdomen that is new, acute  Fever of 100F or higher  For urgent or emergent issues, a gastroenterologist can be reached at any hour by calling [phone]. Do not use MyChart messaging for urgent concerns.    DIET:  We do recommend a small meal at first, but then you may proceed to your regular diet.  Drink plenty of fluids but you should avoid alcoholic beverages for 24 hours.  ACTIVITY:  You should plan to take it  easy for the rest of today and you should NOT DRIVE or use heavy machinery until tomorrow (because of the sedation medicines used during the test).    FOLLOW UP: Our staff will call the number listed on your records the next business day following your procedure.  We will call around 7:15- 8:00 am to check on you and address any questions or concerns that you may have regarding the information given to you following your procedure. If we do not reach you, we will leave a message.     If any biopsies were taken you will be contacted by phone or by letter within the next 1-3 weeks.  Please call us at [phone] if you have not heard about the biopsies in 3 weeks.    SIGNATURES/CONFIDENTIALITY: You and/or your care partner have signed paperwork which will be entered into your electronic medical record.  These signatures attest to the fact that that the information above on your After Visit Summary has been reviewed and is understood.  Full responsibility of the confidentiality of this discharge information lies with you and/or your care-partner. 

## 2021-12-14 NOTE — Op Note (Signed)
[facility] Patient Name: James Garcia Procedure Date: 12/14/2021 1:39 PM MRN: [id number] Endoscopist: Jerene Bears , MD Age: 66 Referring MD:  Date of Birth: 06-28-1955 Gender: Male Account #: [id number] Procedure:                Colonoscopy Indications:              High risk colon cancer surveillance: Personal                            history of multiple adenomas, Last colonoscopy:                            June 2019 (8 polyps removed) Medicines:                Monitored Anesthesia Care Procedure:                Pre-Anesthesia Assessment:                           - Prior to the procedure, a History and Physical                            was performed, and patient medications and                            allergies were reviewed. The patient's tolerance of                            previous anesthesia was also reviewed. The risks                            and benefits of the procedure and the sedation                            options and risks were discussed with the patient.                            All questions were answered, and informed consent                            was obtained. Prior Anticoagulants: The patient has                            taken no previous anticoagulant or antiplatelet                            agents. ASA Grade Assessment: II - A patient with                            mild systemic disease. After reviewing the risks                            and benefits, the patient was deemed in  satisfactory condition to undergo the procedure.                           After obtaining informed consent, the colonoscope                            was passed under direct vision. Throughout the                            procedure, the patient's blood pressure, pulse, and                            oxygen saturations were monitored continuously. The                            Olympus CF-HQ190L ([id number]) Colonoscope  was                            introduced through the anus and advanced to the                            cecum, identified by transillumination. The                            colonoscopy was performed without difficulty. The                            patient tolerated the procedure well. The quality                            of the bowel preparation was good. The ileocecal                            valve, appendiceal orifice, and rectum were                            photographed. Scope In: 1:48:47 PM Scope Out: 2:05:06 PM Scope Withdrawal Time: 0 hours 14 minutes 29 seconds  Total Procedure Duration: 0 hours 16 minutes 19 seconds  Findings:                 The digital rectal exam was normal.                           A 4 mm polyp was found in the ascending colon. The                            polyp was sessile. The polyp was removed with a                            cold snare. Resection and retrieval were complete.                           A 4 mm polyp was found in the proximal transverse  colon. The polyp was sessile. The polyp was removed                            with a cold snare. Resection and retrieval were                            complete.                           Multiple small-mouthed diverticula were found in                            the sigmoid colon and ascending colon.                           Internal hemorrhoids were found during                            retroflexion. The hemorrhoids were small. Complications:            No immediate complications. Estimated Blood Loss:     Estimated blood loss: none. Impression:               - One 4 mm polyp in the ascending colon, removed                            with a cold snare. Resected and retrieved.                           - One 4 mm polyp in the proximal transverse colon,                            removed with a cold snare. Resected and retrieved.                           -  Mild diverticulosis in the sigmoid colon and in                            the ascending colon.                           - Small internal hemorrhoids. Recommendation:           - Patient has a contact number available for                            emergencies. The signs and symptoms of potential                            delayed complications were discussed with the                            patient. Return to normal activities tomorrow.                            Written discharge  instructions were provided to the                            patient.                           - Resume previous diet.                           - Continue present medications.                           - Await pathology results.                           - Repeat colonoscopy is recommended for                            surveillance. The colonoscopy date will be                            determined after pathology results from today's                            exam become available for review. Jerene Bears, MD 12/14/2021 2:11:00 PM This report has been signed electronically.

## 2021-12-14 NOTE — Progress Notes (Signed)
To pacu, VSS. Report to [email or web address] 

## 2021-12-14 NOTE — Progress Notes (Signed)
GASTROENTEROLOGY PROCEDURE H&P NOTE   Primary Care Physician: Horald Pollen, MD    Reason for Procedure:  History of colon polyps  Plan:    Colonoscopy  Patient is appropriate for endoscopic procedure(s) in the ambulatory ([place]) setting.  The nature of the procedure, as well as the risks, benefits, and alternatives were carefully and thoroughly reviewed with the patient. Ample time for discussion and questions allowed. The patient understood, was satisfied, and agreed to proceed.     HPI: James Garcia is a 66 y.o. male who presents for surveillance colonoscopy.  Medical history as below.  Tolerated the prep.  No recent chest pain or shortness of breath.  No abdominal pain today.  Past Medical History:  Diagnosis Date   Allergy    "IN MORNING SNEEZE   Bronchitis    CHRONIC   Constipation    uses OTC laxatives - hard stools every morning- small amount and feels like doesnt empty    Diabetes mellitus without complication (HCC)    DM type 2 (diabetes mellitus, type 2) (HCC)    GERD (gastroesophageal reflux disease)    Hyperlipidemia    Hypertension    Leg cramps    Tuberculin skin test (TST) positive    Tuberculosis    pt states was treated for TB    Past Surgical History:  Procedure Laterality Date   arm surgery     COLONOSCOPY     ELBOW SURGERY Right    FOOT SURGERY Bilateral    POLYPECTOMY      Prior to Admission medications   Medication Sig Start Date End Date Taking? Authorizing Provider  Blood Glucose Monitoring Suppl (FREESTYLE LITE) w/Device KIT Use to test blood sugar daily 03/23/21  Yes Renato Shin, MD  Blood Pressure Monitor KIT Use as instructed for HTN 07/12/17  Yes Wendie Agreste, MD  glucose blood (FREESTYLE LITE) test strip Use as instructed 2 times daily 06/17/21  Yes Renato Shin, MD  Lancets (FREESTYLE) lancets USE TO CHECK BLOOD SUGAR 2 TIMES PER DAY. 12/18/20  Yes Sagardia, Ines Bloomer, MD  neomycin-bacitracin-polymyxin (NEOSPORIN)  ointment Apply 1 application topically every 12 (twelve) hours. 09/20/19  Yes Rosemarie Ax, MD  Semaglutide, 2 MG/DOSE, (OZEMPIC, 2 MG/DOSE,) 8 MG/3ML SOPN Inject 2 mg into the skin once a week. 11/23/21  Yes Philemon Kingdom, MD  amLODipine (NORVASC) 5 MG tablet Take 1 tablet (5 mg total) by mouth daily. 06/25/21   Horald Pollen, MD  atorvastatin (LIPITOR) 20 MG tablet Take 1 tablet (20 mg total) by mouth daily. 06/25/21   Horald Pollen, MD  cetirizine (ZYRTEC) 10 MG tablet Take 10 mg by mouth as needed. 08/30/19   [provider]  COVID-19 mRNA Vac-TriS, Pfizer, SUSP injection Inject into the muscle. Patient not taking: Reported on 09/28/2021 09/03/20   Carlyle Basques, MD  fluticasone [facility]) 50 MCG/ACT nasal spray Place 1-2 sprays into both nostrils daily. Patient not taking: Reported on 12/01/2021 09/16/21   Raspet, Junie Panning K, PA-C  gabapentin (NEURONTIN) 100 MG capsule Take 1 capsule (100 mg total) by mouth 2 (two) times daily. 11/19/21   Horald Pollen, MD  insulin degludec (TRESIBA FLEXTOUCH) 200 UNIT/ML FlexTouch Pen Inject 50 Units into the skin daily. 11/03/21   Philemon Kingdom, MD  Insulin Pen Needle 32G X 4 MM MISC use once daily with insulin 11/03/21   Philemon Kingdom, MD  losartan (COZAAR) 100 MG tablet Take 1 tablet (100 mg total) by mouth daily. 11/30/17  Wendie Agreste, MD  metFORMIN (GLUCOPHAGE-XR) 500 MG 24 hr tablet Take 2 tablets (1,000 mg total) by mouth daily with breakfast. 10/19/21   Philemon Kingdom, MD  omeprazole (PRILOSEC) 20 MG capsule Take 1 capsule (20 mg total) by mouth every morning. 06/25/21   Horald Pollen, MD  pramipexole (MIRAPEX) 0.5 MG tablet Take 1 tablet (0.5 mg total) by mouth 3 (three) times daily. Patient not taking: Reported on 09/28/2021 11/28/19   Sater, Nanine Means, MD  albuterol (PROVENTIL HFA;VENTOLIN HFA) 108 (90 Base) MCG/ACT inhaler Inhale 1-2 puffs into the lungs every 4 (four) hours as needed for wheezing or  shortness of breath. 12/24/17 08/29/19  Wendie Agreste, MD    Current Outpatient Medications  Medication Sig Dispense Refill   Blood Glucose Monitoring Suppl (FREESTYLE LITE) w/Device KIT Use to test blood sugar daily 1 kit 0   Blood Pressure Monitor KIT Use as instructed for HTN 1 each 0   glucose blood (FREESTYLE LITE) test strip Use as instructed 2 times daily 200 each 12   Lancets (FREESTYLE) lancets USE TO CHECK BLOOD SUGAR 2 TIMES PER DAY. 200 each 2   neomycin-bacitracin-polymyxin (NEOSPORIN) ointment Apply 1 application topically every 12 (twelve) hours. 15 g 0   Semaglutide, 2 MG/DOSE, (OZEMPIC, 2 MG/DOSE,) 8 MG/3ML SOPN Inject 2 mg into the skin once a week. 9 mL 3   amLODipine (NORVASC) 5 MG tablet Take 1 tablet (5 mg total) by mouth daily. 90 tablet 3   atorvastatin (LIPITOR) 20 MG tablet Take 1 tablet (20 mg total) by mouth daily. 90 tablet 3   cetirizine (ZYRTEC) 10 MG tablet Take 10 mg by mouth as needed.     COVID-19 mRNA Vac-TriS, Pfizer, SUSP injection Inject into the muscle. (Patient not taking: Reported on 09/28/2021) 0.3 mL 0   fluticasone (FLONASE) 50 MCG/ACT nasal spray Place 1-2 sprays into both nostrils daily. (Patient not taking: Reported on 12/01/2021) 16 g 0   gabapentin (NEURONTIN) 100 MG capsule Take 1 capsule (100 mg total) by mouth 2 (two) times daily. 180 capsule 0   insulin degludec (TRESIBA FLEXTOUCH) 200 UNIT/ML FlexTouch Pen Inject 50 Units into the skin daily. 9 mL 3   Insulin Pen Needle 32G X 4 MM MISC use once daily with insulin 100 each 3   losartan (COZAAR) 100 MG tablet Take 1 tablet (100 mg total) by mouth daily. 90 tablet 0   metFORMIN (GLUCOPHAGE-XR) 500 MG 24 hr tablet Take 2 tablets (1,000 mg total) by mouth daily with breakfast. 180 tablet 0   omeprazole (PRILOSEC) 20 MG capsule Take 1 capsule (20 mg total) by mouth every morning. 90 capsule 0   pramipexole (MIRAPEX) 0.5 MG tablet Take 1 tablet (0.5 mg total) by mouth 3 (three) times daily.  (Patient not taking: Reported on 09/28/2021) 30 tablet 5   Current Facility-Administered Medications  Medication Dose Route Frequency Provider Last Rate Last Admin   0.9 %  sodium chloride infusion  500 mL Intravenous Once Georgeanna Radziewicz, Lajuan Lines, MD       0.9 %  sodium chloride infusion  500 mL Intravenous Continuous Chelesa Weingartner, Lajuan Lines, MD        Allergies as of 12/14/2021 - Review Complete 12/14/2021  Allergen Reaction Noted   Insulin glargine Hives 09/03/2020    Family History  Problem Relation Age of Onset   Diabetes Father    Colon cancer Neg Hx    Colon polyps Neg Hx    Rectal cancer Neg Hx  Stomach cancer Neg Hx    Crohn's disease Neg Hx    Esophageal cancer Neg Hx    Ulcerative colitis Neg Hx     Social History   Socioeconomic History   Marital status: Married    Spouse name: Not on file   Number of children: 2   Years of education: Not on file   Highest education level: Not on file  Occupational History   Occupation: Floor Tech at [employer]   Tobacco Use   Smoking status: Former    Packs/day: 0.25    Years: 5.00    Total pack years: 1.25    Types: Cigarettes    Quit date: 06/04/1978    Years since quitting: 43.5    Passive exposure: Never   Smokeless tobacco: Never  Vaping Use   Vaping Use: Never used  Substance and Sexual Activity   Alcohol use: No   Drug use: No   Sexual activity: Not on file  Other Topics Concern   Not on file  Social History Narrative   Lives with family       Social Determinants of Health   Financial Resource Strain: Not on file  Food Insecurity: Not on file  Transportation Needs: Not on file  Physical Activity: Not on file  Stress: Not on file  Social Connections: Not on file  Intimate Partner Violence: Not on file    Physical Exam: Vital signs in last 24 hours: _0  (!) 156/86   Pulse 78   Temp 98.6 F (37 C)   Ht _1  (1.88 m)   Wt 273 lb 3.2 oz (123.9 kg)   SpO2 97%   BMI 35.08 kg/m  GEN: NAD EYE: Sclerae anicteric ENT:  MMM CV: Non-tachycardic Pulm: CTA b/l GI: Soft, NT/ND NEURO:  Alert & Oriented x 3   Zenovia Jarred, MD [facility]  12/14/2021 1:41 PM

## 2021-12-15 ENCOUNTER — Telehealth: Payer: Self-pay | Admitting: *Deleted

## 2021-12-15 NOTE — Telephone Encounter (Signed)
Follow up call attempt.  Unable to leave VM

## 2021-12-17 ENCOUNTER — Encounter: Payer: Self-pay | Admitting: Internal Medicine

## 2021-12-23 ENCOUNTER — Other Ambulatory Visit (HOSPITAL_COMMUNITY): Payer: Self-pay

## 2021-12-31 ENCOUNTER — Other Ambulatory Visit (HOSPITAL_COMMUNITY): Payer: Self-pay

## 2022-01-01 ENCOUNTER — Other Ambulatory Visit (HOSPITAL_COMMUNITY): Payer: Self-pay

## 2022-01-14 ENCOUNTER — Other Ambulatory Visit (HOSPITAL_COMMUNITY): Payer: Self-pay

## 2022-01-20 ENCOUNTER — Other Ambulatory Visit (HOSPITAL_COMMUNITY): Payer: Self-pay

## 2022-01-20 ENCOUNTER — Other Ambulatory Visit: Payer: Self-pay | Admitting: Internal Medicine

## 2022-01-20 MED ORDER — METFORMIN HCL ER 500 MG PO TB24
1000.0000 mg | ORAL_TABLET | Freq: Every day | ORAL | 0 refills | Status: DC
  Filled 2022-01-20: qty 180, 90d supply, fill #0

## 2022-01-21 ENCOUNTER — Other Ambulatory Visit (HOSPITAL_COMMUNITY): Payer: Self-pay

## 2022-02-08 ENCOUNTER — Other Ambulatory Visit (HOSPITAL_COMMUNITY): Payer: Self-pay

## 2022-02-09 ENCOUNTER — Other Ambulatory Visit (HOSPITAL_COMMUNITY): Payer: Self-pay

## 2022-02-09 IMAGING — MR MR LUMBAR SPINE W/O CM
4 of 5 series · 26 of 48 positions shown · non-contrast
Comparison: None.

CLINICAL DATA: Low back pain for over 6 weeks

EXAM:
MRI LUMBAR SPINE WITHOUT CONTRAST
TECHNIQUE: Multiplanar, multisequence MR imaging of the lumbar spine was
performed. No intravenous contrast was administered.

[Series 2: T2 · sagittal · 4.0mm · 0.59mm/px · 6 of 15 slices shown (1 of 2)]
[im 1/15]
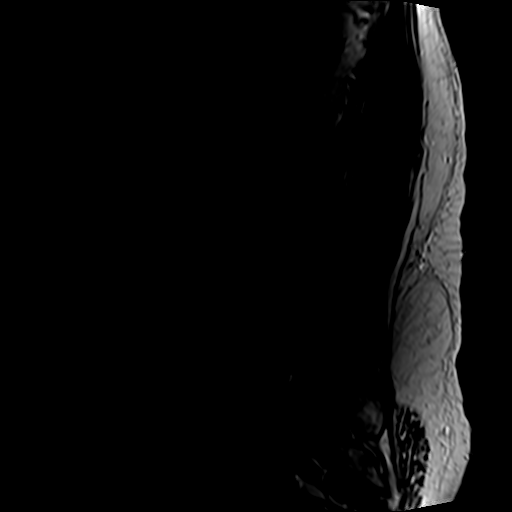
[im 3/15]
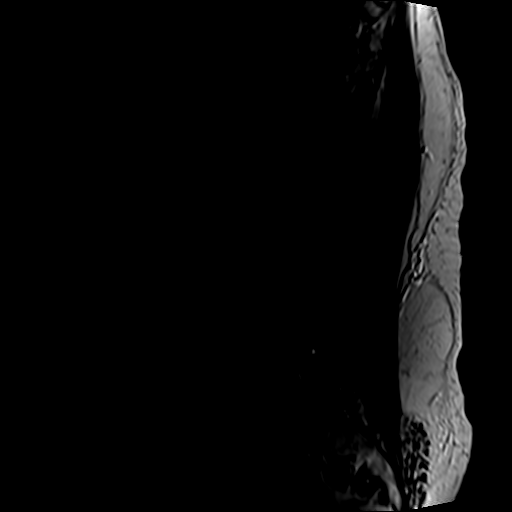
[im 6/15]
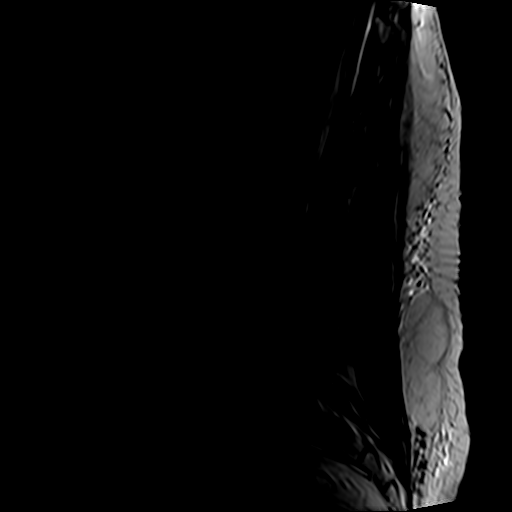
[im 9/15]
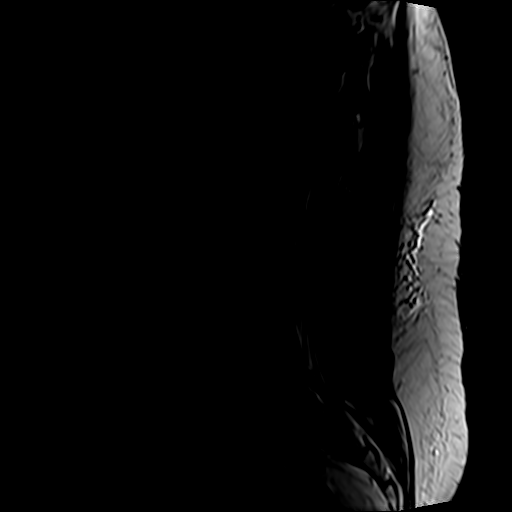
[im 12/15]
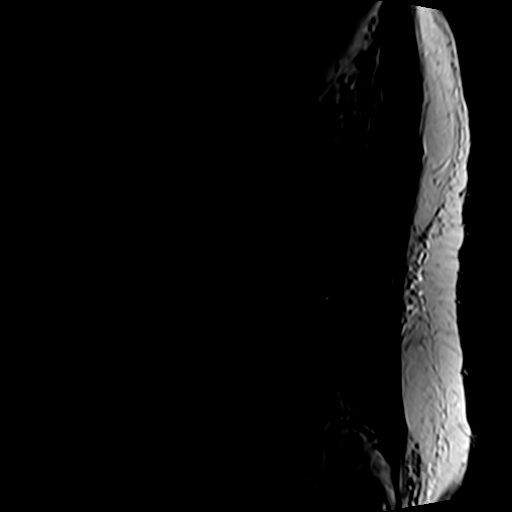
[im 15/15]
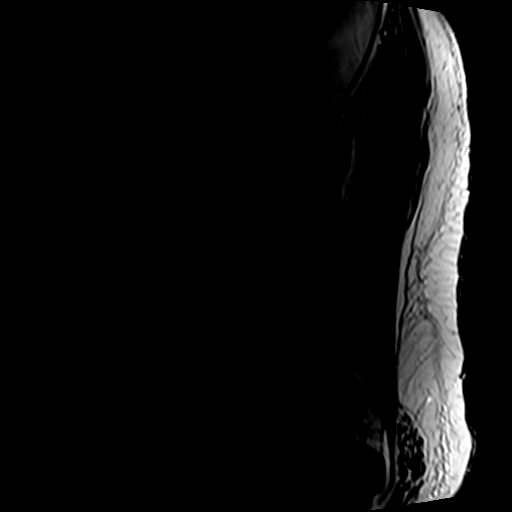

[Series 4: T1 · sagittal · 4.0mm · 0.59mm/px · 6 of 15 slices shown (1 of 2)]
[im 1/15]
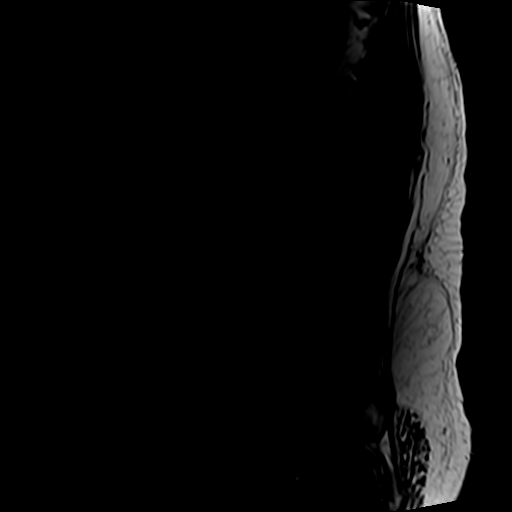
[im 3/15]
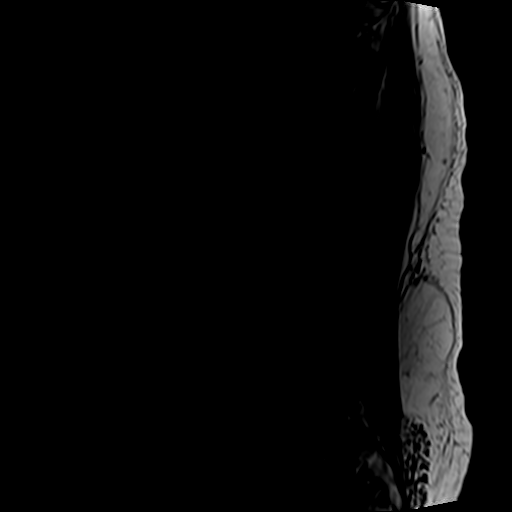
[im 6/15]
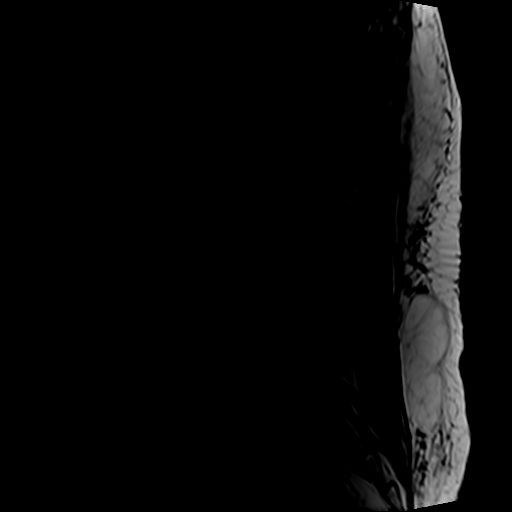
[im 9/15]
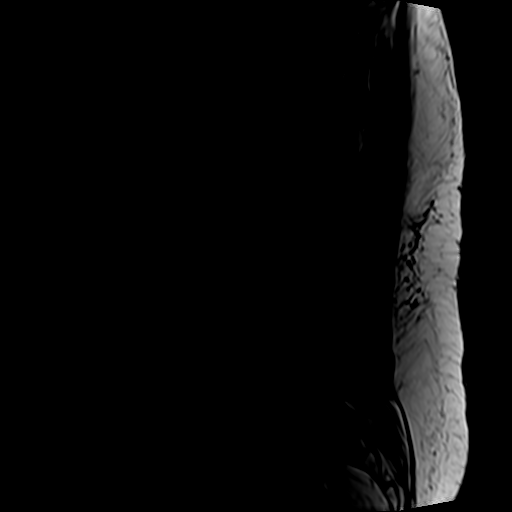
[im 12/15]
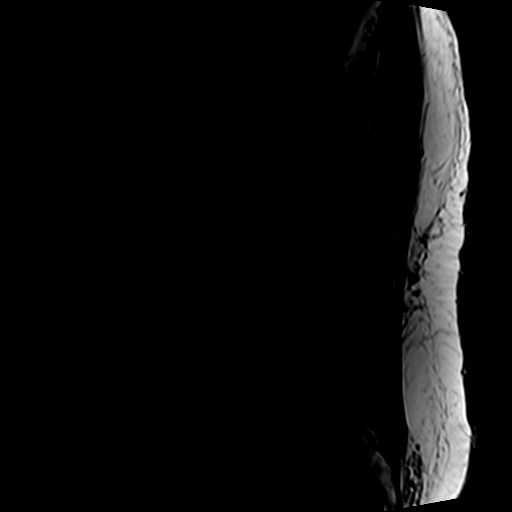
[im 15/15]
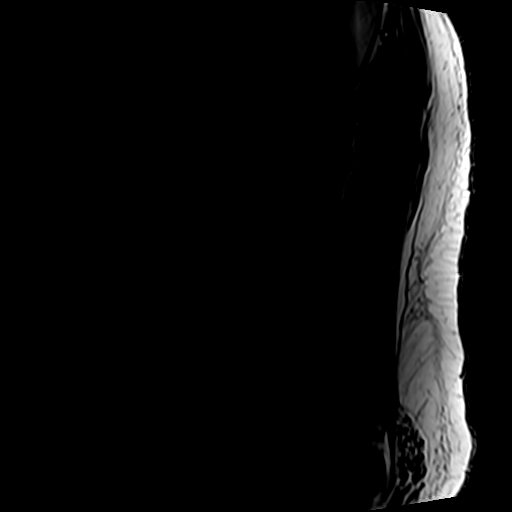

[Series 5: T2 · axial · 4.0mm · 0.70mm/px · z∈[-107,+124]mm · 9 of 41 slices shown (2 of 2)]
[im 1/41]
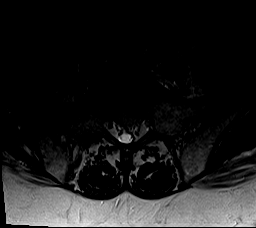
[im 6/41]
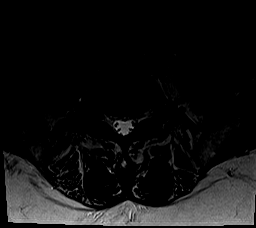
[im 12/41]
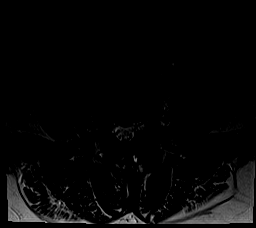
[im 18/41]
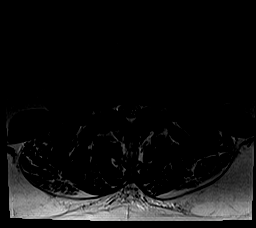
[im 21/41]
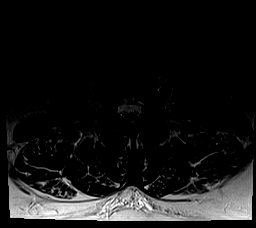
[im 23/41]
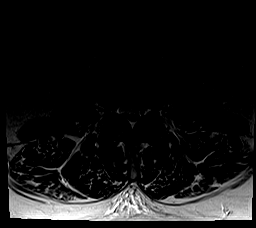
[im 29/41]
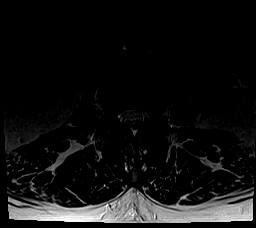
[im 35/41]
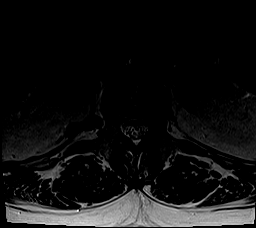
[im 41/41]
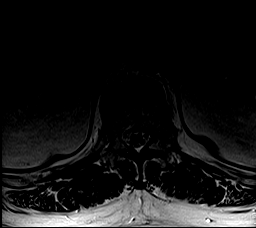

[Series 6: T1 · axial · 4.0mm · 0.35mm/px · z∈[-107,+93]mm · 5 of 41 slices shown (2 of 2)]
[im 1/41]
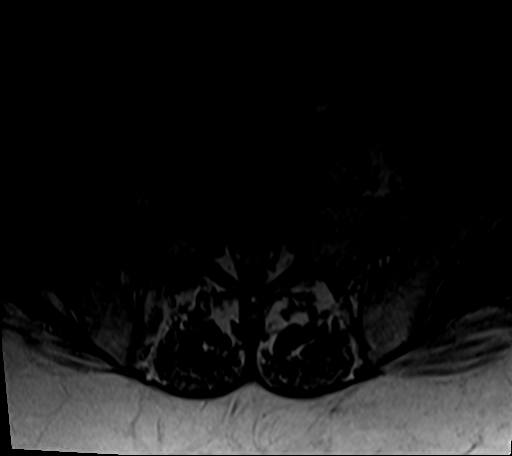
[im 6/41]
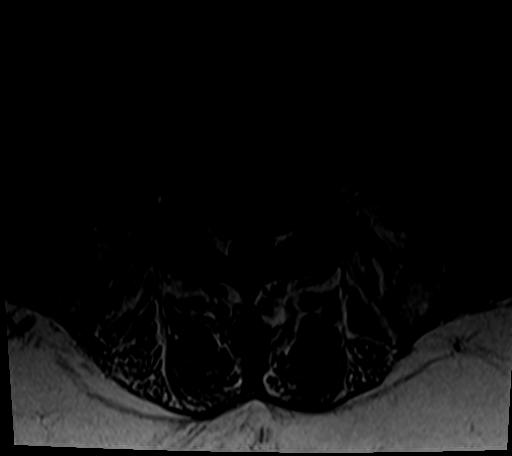
[im 12/41]
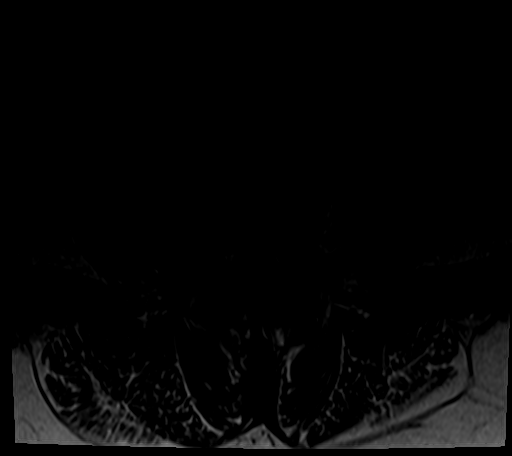
[im 21/41]
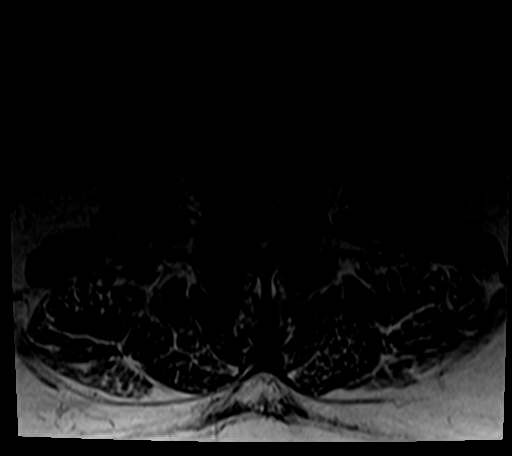
[im 35/41]
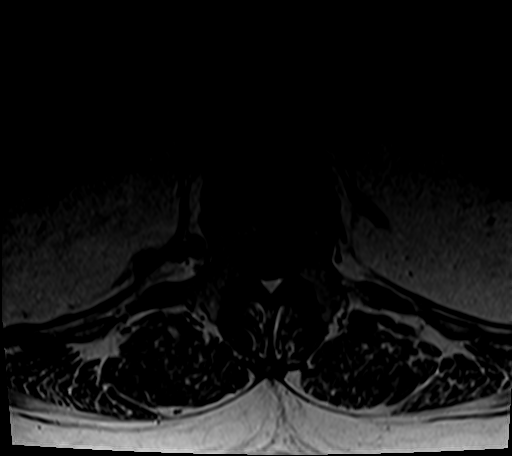

[26 of 48 positions shown; findings below may reference images not displayed]

FINDINGS: Segmentation:  Standard.

Alignment:  Physiologic.

Vertebrae:  No fracture, evidence of discitis, or bone lesion.

Conus medullaris and cauda equina: Conus extends to the T12 level.
Conus and cauda equina appear normal.

Paraspinal and other soft tissues: No evidence of perispinal mass or
inflammation.

Disc levels:

T12- L1: Ventral spondylitic spurring

L1-L2: Mild facet spurring

L2-L3: Mild facet spurring

L3-L4: Disc narrowing and bilateral inferior foraminal bulging with
left inferior foraminal protrusion. Asymmetric left facet spurring.
Moderate thecal sac compression. Moderate left foraminal narrowing

L4-L5: Disc narrowing and bulging with mild facet spurring. There is
suggestion of a right foraminal herniation on axial T1 weighted
imaging that is not clearly defined on sagittal images, although
there does appear to be moderate right foraminal narrowing. Mild
right foraminal narrowing

L5-S1:Mild disc bulging and facet spurring.
IMPRESSION: 1. Lumbar spine degeneration most notable at L3-4 where there is
moderate thecal sac narrowing.
2. L3-4 moderate left foraminal narrowing.
3. Axial images suggest right foraminal impingement at L4-5, but
this is less apparent on sagittal imaging.

## 2022-02-22 ENCOUNTER — Other Ambulatory Visit: Payer: Self-pay | Admitting: Emergency Medicine

## 2022-02-22 ENCOUNTER — Other Ambulatory Visit (HOSPITAL_COMMUNITY): Payer: Self-pay

## 2022-02-22 DIAGNOSIS — G6289 Other specified polyneuropathies: Secondary | ICD-10-CM

## 2022-02-22 MED ORDER — GABAPENTIN 100 MG PO CAPS
100.0000 mg | ORAL_CAPSULE | Freq: Two times a day (BID) | ORAL | 0 refills | Status: DC
  Filled 2022-02-22: qty 180, 90d supply, fill #0

## 2022-03-01 ENCOUNTER — Other Ambulatory Visit (HOSPITAL_COMMUNITY): Payer: Self-pay

## 2022-03-11 ENCOUNTER — Ambulatory Visit (INDEPENDENT_AMBULATORY_CARE_PROVIDER_SITE_OTHER): Payer: 59 | Admitting: Internal Medicine

## 2022-03-11 ENCOUNTER — Encounter: Payer: Self-pay | Admitting: Internal Medicine

## 2022-03-11 VITALS — BP 128/80 | HR 66 | Ht 74.0 in | Wt 271.4 lb

## 2022-03-11 DIAGNOSIS — E1169 Type 2 diabetes mellitus with other specified complication: Secondary | ICD-10-CM

## 2022-03-11 DIAGNOSIS — Z794 Long term (current) use of insulin: Secondary | ICD-10-CM | POA: Diagnosis not present

## 2022-03-11 DIAGNOSIS — E785 Hyperlipidemia, unspecified: Secondary | ICD-10-CM | POA: Diagnosis not present

## 2022-03-11 DIAGNOSIS — E1141 Type 2 diabetes mellitus with diabetic mononeuropathy: Secondary | ICD-10-CM

## 2022-03-11 LAB — POCT GLYCOSYLATED HEMOGLOBIN (HGB A1C): Hemoglobin A1C: 7.2 % — AB (ref 4.0–5.6)

## 2022-03-11 NOTE — Progress Notes (Signed)
Patient ID: James Garcia, male   DOB: 1955/05/05, 66 y.o.   MRN: [id number]  HPI: James Garcia is a 66 y.o.-year-old male, returning for follow-up for DM2, dx in 2007, insulin-dependent since 2016, fairly well controlled, with complications (diabetic retinopathy, peripheral neuropathy). Pt. previously saw Dr. Loanne Drilling, but last visit with me 4 mo ago.  Interim history: No increased urination, blurry vision, nausea, chest pain. He does mention mm cramps at night.  Reviewed HbA1c: Lab Results  Component Value Date   HGBA1C 7.1 (A) 11/03/2021   HGBA1C 6.8 (A) 09/28/2021   HGBA1C 7.0 (A) 03/23/2021   HGBA1C 7.5 (A) 12/08/2020   HGBA1C 6.2 (A) 09/01/2020   HGBA1C 7.6 (A) 04/21/2020   HGBA1C 7.3 (H) 11/20/2019   HGBA1C 7.0 (A) 08/16/2019   HGBA1C 7.2 (A) 04/04/2019   HGBA1C 7.4 (A) 02/01/2019   Pt is on a regimen of: - Metformin ER 1000 mg 2x a day, with meals - Semglee 55 units in am - long needles (8 mm) >> Tresiba U200 50 units daily. - Ozempic 2 mg weekly He tried [place] >> rash.  Pt checks his sugars 2x a day and they are - per meter's download: - am: 94-173, 218 >> 67, 72-145, 180, 217 - 2h after b'fast: n/c - before lunch: 156 - 2h after lunch: n/c - before dinner: n/c - 2h after dinner: n/c - bedtime: 87, 100-207 (before his dinner, when he comes home from work) >> 107-187 - nighttime: n/c Lowest sugar was 87 >> 67; he has hypoglycemia awareness at 70s.  Highest sugar was 218>> 220.  Glucometer: Freestyle Lite  Pt's meals are: - Breakfast: fried eggs + toast and coffee - Lunch: skips - Dinner: 7-8 am: meat + veggies (cafeteria at [facility]) - bedtime snack: chicken or fried egg  - no CKD, last BUN/creatinine:  Lab Results  Component Value Date   BUN 15 09/28/2021   BUN 15 11/10/2020   CREATININE 0.79 09/28/2021   CREATININE 0.86 11/10/2020  On Cozaar 100 mg daily.  - + HL; last set of lipids: Lab Results  Component Value Date   CHOL 127  09/28/2021   HDL 49.20 09/28/2021   LDLCALC 66 09/28/2021   TRIG 58.0 09/28/2021   CHOLHDL 3 09/28/2021  On Lipitor 20 mg daily  - last eye exam was in 07/2021. + DR reportedly.   - + numbness and tingling in his feet.  Last foot exam 12/08/2020. On Neurontin 200 mg 2x a day.  He has a history of HTN, constipation, history of gallbladder stone, history of TB.  ROS: + see HPI  Past Medical History:  Diagnosis Date   Allergy    "IN MORNING SNEEZE   Bronchitis    CHRONIC   Constipation    uses OTC laxatives - hard stools every morning- small amount and feels like doesnt empty    Diabetes mellitus without complication ([place])    DM type 2 (diabetes mellitus, type 2) (HCC)    GERD (gastroesophageal reflux disease)    Hyperlipidemia    Hypertension    Leg cramps    Tuberculin skin test (TST) positive    Tuberculosis    pt states was treated for TB   Past Surgical History:  Procedure Laterality Date   arm surgery     COLONOSCOPY     ELBOW SURGERY Right    FOOT SURGERY Bilateral    POLYPECTOMY     Social History   Socioeconomic History  Marital status: Married    Spouse name: Not on file   Number of children: 2   Years of education: Not on file   Highest education level: Not on file  Occupational History   Occupation: Floor Tech at [employer]   Tobacco Use   Smoking status: Former    Packs/day: 0.25    Years: 5.00    Total pack years: 1.25    Types: Cigarettes    Quit date: 06/04/1978    Years since quitting: 43.7    Passive exposure: Never   Smokeless tobacco: Never  Vaping Use   Vaping Use: Never used  Substance and Sexual Activity   Alcohol use: No   Drug use: No   Sexual activity: Not on file  Other Topics Concern   Not on file  Social History Narrative   Lives with family       Social Determinants of Health   Financial Resource Strain: Not on file  Food Insecurity: Not on file  Transportation Needs: Not on file  Physical Activity: Not on file   Stress: Not on file  Social Connections: Not on file  Intimate Partner Violence: Not on file   Current Outpatient Medications on File Prior to Visit  Medication Sig Dispense Refill   amLODipine (NORVASC) 5 MG tablet Take 1 tablet (5 mg total) by mouth daily. 90 tablet 3   atorvastatin (LIPITOR) 20 MG tablet Take 1 tablet (20 mg total) by mouth daily. 90 tablet 3   Blood Glucose Monitoring Suppl (FREESTYLE LITE) w/Device KIT Use to test blood sugar daily 1 kit 0   Blood Pressure Monitor KIT Use as instructed for HTN 1 each 0   cetirizine (ZYRTEC) 10 MG tablet Take 10 mg by mouth as needed.     COVID-19 mRNA Vac-TriS, Pfizer, SUSP injection Inject into the muscle. (Patient not taking: Reported on 09/28/2021) 0.3 mL 0   fluticasone (FLONASE) 50 MCG/ACT nasal spray Place 1-2 sprays into both nostrils daily. (Patient not taking: Reported on 12/01/2021) 16 g 0   gabapentin (NEURONTIN) 100 MG capsule Take 1 capsule (100 mg total) by mouth 2 (two) times daily. 180 capsule 0   glucose blood (FREESTYLE LITE) test strip Use as instructed 2 times daily 200 each 12   insulin degludec (TRESIBA FLEXTOUCH) 200 UNIT/ML FlexTouch Pen Inject 50 Units into the skin daily. 9 mL 3   Insulin Pen Needle 32G X 4 MM MISC use once daily with insulin 100 each 3   Lancets (FREESTYLE) lancets USE TO CHECK BLOOD SUGAR 2 TIMES PER DAY. 200 each 2   losartan (COZAAR) 100 MG tablet Take 1 tablet (100 mg total) by mouth daily. 90 tablet 0   metFORMIN (GLUCOPHAGE-XR) 500 MG 24 hr tablet Take 2 tablets (1,000 mg total) by mouth daily with breakfast. 180 tablet 0   neomycin-bacitracin-polymyxin (NEOSPORIN) ointment Apply 1 application topically every 12 (twelve) hours. 15 g 0   omeprazole (PRILOSEC) 20 MG capsule Take 1 capsule (20 mg total) by mouth every morning. 90 capsule 0   pramipexole (MIRAPEX) 0.5 MG tablet Take 1 tablet (0.5 mg total) by mouth 3 (three) times daily. (Patient not taking: Reported on 09/28/2021) 30 tablet 5    Semaglutide, 2 MG/DOSE, (OZEMPIC, 2 MG/DOSE,) 8 MG/3ML SOPN Inject 2 mg into the skin once a week. 9 mL 3   [DISCONTINUED] albuterol (PROVENTIL HFA;VENTOLIN HFA) 108 (90 Base) MCG/ACT inhaler Inhale 1-2 puffs into the lungs every 4 (four) hours as needed for wheezing or  shortness of breath. 1 Inhaler 0   Current Facility-Administered Medications on File Prior to Visit  Medication Dose Route Frequency Provider Last Rate Last Admin   0.9 %  sodium chloride infusion  500 mL Intravenous Once Pyrtle, Lajuan Lines, MD       Allergies  Allergen Reactions   Insulin Glargine Hives    specifically Semglee   Family History  Problem Relation Age of Onset   Diabetes Father    Colon cancer Neg Hx    Colon polyps Neg Hx    Rectal cancer Neg Hx    Stomach cancer Neg Hx    Crohn's disease Neg Hx    Esophageal cancer Neg Hx    Ulcerative colitis Neg Hx     PE: There were no vitals taken for this visit. Wt Readings from Last 3 Encounters:  12/14/21 273 lb 3.2 oz (123.9 kg)  12/01/21 273 lb 3.2 oz (123.9 kg)  11/03/21 273 lb 12.8 oz (124.2 kg)   Constitutional: overweight, in NAD Eyes: no exophthalmos ENT: no thyromegaly, no cervical lymphadenopathy Cardiovascular: RRR, No MRG Respiratory: CTA B Musculoskeletal: + deformities - L elbow deformity after being dislocated and healing in the wrong position Skin:  no rashes Neurological: no tremor with outstretched hands Diabetic Foot Exam - Simple   Simple Foot Form Diabetic Foot exam was performed with the following findings: Yes 03/11/2022  1:15 PM  Visual Inspection No deformities, no ulcerations, no other skin breakdown bilaterally: Yes Sensation Testing Intact to touch and monofilament testing bilaterally: Yes Pulse Check Posterior Tibialis and Dorsalis pulse intact bilaterally: Yes Comments    ASSESSMENT: 1. DM2, insulin-dependent, uncontrolled, with complications - DR - PN  2. HL  PLAN:  1. Patient with longstanding,  uncontrolled, type 2 diabetes, on oral antidiabetic regimen with metformin and also weekly GLP-1 receptor agonist and daily long-acting insulin, with suboptimal control.  At last visit, HbA1c was higher, at 7.1%, increased from 6.8%.  Reviewing his meter download, sugars were mostly at goal but he had hyperglycemic spikes in the 190s and even 200s both in the morning and when coming back from work, before his bedtime snack.  We discussed about the need to improve his diet.  He was eating 2-3 eggs a day whenever eating breakfast, but he was skipping breakfast and occasionally skipping lunch, also.  He was eating fried foods and I advised him that anything fried will increase his blood sugars significantly.  Recommended a lighter meal before going to bed including salads, fruit, half a sandwich, unsalted nuts.  He was telling me that he was worried about blood sugars dropping too much if he did not eat a larger snack and I advised him to reduce the dose of his insulin.  I also advised him to switch from [facility] to [place] for more stability of his blood sugars overnight.  We continued Ozempic and metformin. -At today's visit, sugars appear to be fluctuating, with most values at goal, but he still has hyperglycemic exceptions and an occasional blood sugar in the 60s.  We discussed about limiting dietary indiscretions but for now, I did not recommend a change in regimen. - I suggested to:  Patient Instructions  Please continue: - Metformin ER 1000 mg 2x a day, with meals - Ozempic 2 mg weekly - Tresiba U200 50 units daily  Stop fried foods and reduce the snack before bed.  Please return in 4 months with your sugar log.   - we checked his HbA1c: 7.2% (  slightly higher) - advised to check sugars at different times of the day - 1-2x a day, rotating check times - advised for yearly eye exams >> he is UTD - for mm cramps, I advised him to try to stay hydrated and possibly take a magnesium supplement.  We also  discussed about possibly using multivitamin for men. - return to clinic in 3-4 months  2. HL -Reviewed latest lipid panel from 09/2021: Fractions at goal: Lab Results  Component Value Date   CHOL 127 09/28/2021   HDL 49.20 09/28/2021   LDLCALC 66 09/28/2021   TRIG 58.0 09/28/2021   CHOLHDL 3 09/28/2021  -Continues Lipitor 20 mg daily without side effects  Philemon Kingdom, MD PhD [facility]

## 2022-03-11 NOTE — Patient Instructions (Signed)
Please continue: - Metformin ER 1000 mg 2x a day, with meals - Ozempic 2 mg weekly - Tresiba U200 50 units daily  Stop fried foods and reduce the snack before bed.  Please return in 4 months with your sugar log.

## 2022-03-26 ENCOUNTER — Other Ambulatory Visit (HOSPITAL_COMMUNITY): Payer: Self-pay

## 2022-03-29 ENCOUNTER — Other Ambulatory Visit (HOSPITAL_COMMUNITY): Payer: Self-pay

## 2022-03-29 ENCOUNTER — Other Ambulatory Visit: Payer: Self-pay | Admitting: Internal Medicine

## 2022-03-29 MED ORDER — TRESIBA FLEXTOUCH 200 UNIT/ML ~~LOC~~ SOPN
50.0000 [IU] | PEN_INJECTOR | Freq: Every day | SUBCUTANEOUS | 3 refills | Status: DC
  Filled 2022-03-29: qty 6, 24d supply, fill #0
  Filled 2022-04-16: qty 6, 24d supply, fill #1
  Filled 2022-05-11: qty 6, 24d supply, fill #2
  Filled 2022-06-08: qty 6, 24d supply, fill #3
  Filled 2022-06-30: qty 6, 24d supply, fill #4
  Filled 2022-07-22: qty 6, 24d supply, fill #5

## 2022-04-16 ENCOUNTER — Other Ambulatory Visit: Payer: Self-pay

## 2022-04-16 ENCOUNTER — Other Ambulatory Visit: Payer: Self-pay | Admitting: Internal Medicine

## 2022-04-16 ENCOUNTER — Other Ambulatory Visit (HOSPITAL_COMMUNITY): Payer: Self-pay

## 2022-04-16 MED ORDER — METFORMIN HCL ER 500 MG PO TB24
1000.0000 mg | ORAL_TABLET | Freq: Every day | ORAL | 0 refills | Status: DC
  Filled 2022-04-16: qty 180, 90d supply, fill #0

## 2022-04-19 ENCOUNTER — Other Ambulatory Visit (HOSPITAL_COMMUNITY): Payer: Self-pay

## 2022-04-23 ENCOUNTER — Other Ambulatory Visit (HOSPITAL_COMMUNITY): Payer: Self-pay

## 2022-05-03 ENCOUNTER — Telehealth: Payer: Self-pay | Admitting: Internal Medicine

## 2022-05-03 ENCOUNTER — Other Ambulatory Visit (HOSPITAL_COMMUNITY): Payer: Self-pay

## 2022-05-03 DIAGNOSIS — Z794 Long term (current) use of insulin: Secondary | ICD-10-CM

## 2022-05-03 MED ORDER — OZEMPIC (2 MG/DOSE) 8 MG/3ML ~~LOC~~ SOPN
2.0000 mg | PEN_INJECTOR | SUBCUTANEOUS | 3 refills | Status: DC
  Filled 2022-05-03: qty 3, 28d supply, fill #0
  Filled 2022-05-26 – 2022-05-27 (×2): qty 3, 28d supply, fill #1
  Filled 2022-06-21: qty 3, 28d supply, fill #2
  Filled 2022-07-22: qty 3, 28d supply, fill #3
  Filled 2022-08-17 (×2): qty 3, 28d supply, fill #4
  Filled 2022-09-15: qty 3, 28d supply, fill #5
  Filled 2022-10-11: qty 3, 28d supply, fill #6
  Filled 2022-11-10: qty 3, 28d supply, fill #7
  Filled 2022-12-07: qty 3, 28d supply, fill #8
  Filled 2023-01-05: qty 3, 28d supply, fill #9
  Filled 2023-02-02: qty 3, 28d supply, fill #10
  Filled 2023-02-28: qty 3, 28d supply, fill #11

## 2022-05-03 NOTE — Telephone Encounter (Signed)
New message   Prior Authorization   1. Which medications need to be refilled? (please list name of each medication and dose if known) Semaglutide, 2 MG/DOSE, (OZEMPIC, 2 MG/DOSE,) 8 MG/3ML SOPN   2. Which pharmacy/location (including street and city if local pharmacy) is medication to be sent to? [facility]   3. Do they need a 30 day or 90 day supply? 90 day supply

## 2022-05-03 NOTE — Telephone Encounter (Signed)
Rx sent 

## 2022-05-04 ENCOUNTER — Telehealth: Payer: Self-pay

## 2022-05-04 NOTE — Telephone Encounter (Signed)
Pt advise PA needed for Ozempic.

## 2022-05-06 ENCOUNTER — Other Ambulatory Visit (HOSPITAL_COMMUNITY): Payer: Self-pay

## 2022-05-06 NOTE — Telephone Encounter (Signed)
Pharmacy Patient Advocate Encounter   Received notification from Staff msgs/RMA that prior authorization for Ozempic 21m is required/requested.  Per Test Claim: too soon to fill. Dispensed 05/04/22 @ [facility]. Looks like the initial ins rejection was that he could only fill a 30 day supply. Should be ok going forward.

## 2022-05-07 ENCOUNTER — Other Ambulatory Visit (HOSPITAL_COMMUNITY): Payer: Self-pay

## 2022-05-12 ENCOUNTER — Other Ambulatory Visit (HOSPITAL_COMMUNITY): Payer: Self-pay

## 2022-05-26 ENCOUNTER — Other Ambulatory Visit: Payer: Self-pay | Admitting: Emergency Medicine

## 2022-05-26 ENCOUNTER — Other Ambulatory Visit (HOSPITAL_COMMUNITY): Payer: Self-pay

## 2022-05-26 DIAGNOSIS — G6289 Other specified polyneuropathies: Secondary | ICD-10-CM

## 2022-05-26 MED ORDER — GABAPENTIN 100 MG PO CAPS
100.0000 mg | ORAL_CAPSULE | Freq: Two times a day (BID) | ORAL | 0 refills | Status: DC
  Filled 2022-05-26 – 2022-05-27 (×2): qty 180, 90d supply, fill #0

## 2022-05-27 ENCOUNTER — Other Ambulatory Visit (HOSPITAL_COMMUNITY): Payer: Self-pay

## 2022-05-28 ENCOUNTER — Other Ambulatory Visit (HOSPITAL_COMMUNITY): Payer: Self-pay

## 2022-06-08 ENCOUNTER — Other Ambulatory Visit (HOSPITAL_COMMUNITY): Payer: Self-pay

## 2022-06-21 ENCOUNTER — Other Ambulatory Visit: Payer: Self-pay

## 2022-06-24 ENCOUNTER — Ambulatory Visit: Payer: Self-pay | Admitting: *Deleted

## 2022-06-24 NOTE — Telephone Encounter (Signed)
Patient called regarding left flank pain and during transfer from agent call disconnected. NT attempted to call patient back on #[phone] to review sx. No answer, LVMTCB #[phone].

## 2022-06-28 ENCOUNTER — Encounter: Payer: Self-pay | Admitting: *Deleted

## 2022-06-30 ENCOUNTER — Other Ambulatory Visit (HOSPITAL_COMMUNITY): Payer: Self-pay

## 2022-06-30 ENCOUNTER — Ambulatory Visit: Payer: 59 | Admitting: Emergency Medicine

## 2022-06-30 ENCOUNTER — Encounter: Payer: Self-pay | Admitting: Emergency Medicine

## 2022-06-30 VITALS — BP 134/76 | HR 67 | Temp 98.1°F | Ht 74.0 in | Wt 273.2 lb

## 2022-06-30 DIAGNOSIS — M5136 Other intervertebral disc degeneration, lumbar region: Secondary | ICD-10-CM | POA: Insufficient documentation

## 2022-06-30 DIAGNOSIS — M545 Low back pain, unspecified: Secondary | ICD-10-CM

## 2022-06-30 DIAGNOSIS — S39012A Strain of muscle, fascia and tendon of lower back, initial encounter: Secondary | ICD-10-CM

## 2022-06-30 DIAGNOSIS — M51369 Other intervertebral disc degeneration, lumbar region without mention of lumbar back pain or lower extremity pain: Secondary | ICD-10-CM

## 2022-06-30 MED ORDER — TRAMADOL HCL 50 MG PO TABS
50.0000 mg | ORAL_TABLET | Freq: Three times a day (TID) | ORAL | 0 refills | Status: AC | PRN
Start: 2022-06-30 — End: 2022-07-05
  Filled 2022-06-30: qty 15, 5d supply, fill #0

## 2022-06-30 MED ORDER — METHYLPREDNISOLONE 4 MG PO TBPK
ORAL_TABLET | ORAL | 1 refills | Status: AC
  Filled 2022-06-30: qty 21, 6d supply, fill #0

## 2022-06-30 MED ORDER — KETOROLAC TROMETHAMINE 60 MG/2ML IM SOLN
60.0000 mg | Freq: Once | INTRAMUSCULAR | Status: AC
  Administered 2022-06-30: 60 mg via INTRAMUSCULAR

## 2022-06-30 MED ORDER — CYCLOBENZAPRINE HCL 10 MG PO TABS
10.0000 mg | ORAL_TABLET | Freq: Every day | ORAL | 0 refills | Status: AC
  Filled 2022-06-30: qty 10, 10d supply, fill #0

## 2022-06-30 NOTE — Progress Notes (Signed)
James Garcia 67 y.o.   Chief Complaint  Patient presents with   Back Pain    Left sided lower back pain, not sure if its his kidney     HISTORY OF PRESENT ILLNESS: Acute problem visit today. This is a 67 y.o. male complaining of left-sided lumbar sharp pain since last week without injury and no associated symptoms.  No radiation.  Better with rest and worse with movement.  Past history of degenerative disc disease of lumbar spine as per lumbar spine MRI 2022. No other associated symptoms No other complaints or medical concerns today.  Back Pain Pertinent negatives include no chest pain, dysuria, fever or headaches.     Prior to Admission medications   Medication Sig Start Date End Date Taking? Authorizing Provider  amLODipine (NORVASC) 5 MG tablet Take 1 tablet (5 mg total) by mouth daily. 06/25/21  Yes Landon Truax, Eilleen Kempf, MD  atorvastatin (LIPITOR) 20 MG tablet Take 1 tablet (20 mg total) by mouth daily. 06/25/21  Yes Torey Regan, Eilleen Kempf, MD  Blood Glucose Monitoring Suppl (FREESTYLE LITE) w/Device KIT Use to test blood sugar daily 03/23/21  Yes Romero Belling, MD  cetirizine (ZYRTEC) 10 MG tablet Take 10 mg by mouth as needed. 08/30/19  Yes [provider]  cyclobenzaprine (FLEXERIL) 10 MG tablet Take 1 tablet (10 mg total) by mouth at bedtime for 10 days. 06/30/22 07/10/22 Yes Gerald Kuehl, Eilleen Kempf, MD  gabapentin (NEURONTIN) 100 MG capsule Take 1 capsule (100 mg total) by mouth 2 (two) times daily. 05/26/22  Yes Feliciana Narayan, Eilleen Kempf, MD  glucose blood (FREESTYLE LITE) test strip Use as instructed 2 times daily 06/17/21  Yes Romero Belling, MD  insulin degludec (TRESIBA FLEXTOUCH) 200 UNIT/ML FlexTouch Pen Inject 50 Units into the skin daily. 03/29/22  Yes Carlus Pavlov, MD  Insulin Pen Needle 32G X 4 MM MISC use once daily with insulin 11/03/21  Yes Carlus Pavlov, MD  Lancets (FREESTYLE) lancets USE TO CHECK BLOOD SUGAR 2 TIMES PER DAY. 12/18/20  Yes Abdulraheem Pineo, Eilleen Kempf, MD  losartan (COZAAR) 100 MG tablet Take 1 tablet (100 mg total) by mouth daily. 11/30/17  Yes Shade Flood, MD  metFORMIN (GLUCOPHAGE-XR) 500 MG 24 hr tablet Take 2 tablets (1,000 mg total) by mouth daily with breakfast. 04/16/22  Yes Carlus Pavlov, MD  methylPREDNISolone (MEDROL DOSEPAK) 4 MG TBPK tablet Sig as indicated 06/30/22  Yes Jalynn Waddell, Eilleen Kempf, MD  neomycin-bacitracin-polymyxin (NEOSPORIN) ointment Apply 1 application topically every 12 (twelve) hours. 09/20/19  Yes Myra Rude, MD  omeprazole (PRILOSEC) 20 MG capsule Take 1 capsule (20 mg total) by mouth every morning. 06/25/21  Yes Alilah Mcmeans, Eilleen Kempf, MD  Semaglutide, 2 MG/DOSE, (OZEMPIC, 2 MG/DOSE,) 8 MG/3ML SOPN Inject 2 mg into the skin once a week. 05/03/22  Yes Carlus Pavlov, MD  traMADol (ULTRAM) 50 MG tablet Take 1 tablet (50 mg total) by mouth every 8 (eight) hours as needed for up to 5 days. 06/30/22 07/05/22 Yes SagardiaEilleen Kempf, MD  Blood Pressure Monitor KIT Use as instructed for HTN Patient not taking: Reported on 06/30/2022 07/12/17   Shade Flood, MD  fluticasone [facility]) 50 MCG/ACT nasal spray Place 1-2 sprays into both nostrils daily. Patient not taking: Reported on 12/01/2021 09/16/21   Raspet, Noberto Retort, PA-C  pramipexole (MIRAPEX) 0.5 MG tablet Take 1 tablet (0.5 mg total) by mouth 3 (three) times daily. Patient not taking: Reported on 06/30/2022 11/28/19   Asa Lente, MD  albuterol (PROVENTIL HFA;VENTOLIN  HFA) 108 (90 Base) MCG/ACT inhaler Inhale 1-2 puffs into the lungs every 4 (four) hours as needed for wheezing or shortness of breath. 12/24/17 08/29/19  Shade Flood, MD    Allergies  Allergen Reactions   Insulin Glargine Hives    specifically Semglee    Patient Active Problem List   Diagnosis Date Noted   Acute lumbar myofascial strain 06/30/2022   Degenerative disc disease, lumbar 06/30/2022   Paraumbilical hernia 12/01/2020   Calculus of gallbladder without  cholecystitis without obstruction 12/01/2020   Diverticulosis 12/01/2020   Atherosclerosis of aorta 12/01/2020   Hypertension associated with diabetes 11/10/2020   Restless leg 11/28/2019   Dyslipidemia associated with type 2 diabetes mellitus 05/01/2015    Past Medical History:  Diagnosis Date   Allergy    "IN MORNING SNEEZE   Bronchitis    CHRONIC   Constipation    uses OTC laxatives - hard stools every morning- small amount and feels like doesnt empty    Diabetes mellitus without complication (HCC)    DM type 2 (diabetes mellitus, type 2) (HCC)    GERD (gastroesophageal reflux disease)    Hyperlipidemia    Hypertension    Leg cramps    Tuberculin skin test (TST) positive    Tuberculosis    pt states was treated for TB    Past Surgical History:  Procedure Laterality Date   arm surgery     COLONOSCOPY     ELBOW SURGERY Right    FOOT SURGERY Bilateral    POLYPECTOMY      Social History   Socioeconomic History   Marital status: Married    Spouse name: Not on file   Number of children: 2   Years of education: Not on file   Highest education level: Not on file  Occupational History   Occupation: Floor Tech at [employer]   Tobacco Use   Smoking status: Former    Packs/day: 0.25    Years: 5.00    Additional pack years: 0.00    Total pack years: 1.25    Types: Cigarettes    Quit date: 06/04/1978    Years since quitting: 44.1    Passive exposure: Never   Smokeless tobacco: Never  Vaping Use   Vaping Use: Never used  Substance and Sexual Activity   Alcohol use: No   Drug use: No   Sexual activity: Not on file  Other Topics Concern   Not on file  Social History Narrative   Lives with family       Social Determinants of Health   Financial Resource Strain: Not on file  Food Insecurity: Not on file  Transportation Needs: Not on file  Physical Activity: Not on file  Stress: Not on file  Social Connections: Not on file  Intimate Partner Violence: Not on file     Family History  Problem Relation Age of Onset   Diabetes Father    Colon cancer Neg Hx    Colon polyps Neg Hx    Rectal cancer Neg Hx    Stomach cancer Neg Hx    Crohn's disease Neg Hx    Esophageal cancer Neg Hx    Ulcerative colitis Neg Hx      Review of Systems  Constitutional: Negative.  Negative for chills and fever.  Cardiovascular: Negative.  Negative for chest pain and palpitations.  Gastrointestinal:  Negative for nausea and vomiting.  Genitourinary: Negative.  Negative for dysuria and hematuria.  Musculoskeletal:  Positive for back  pain.  Skin: Negative.  Negative for rash.  Neurological: Negative.  Negative for dizziness and headaches.    Vitals:   06/30/22 1306  BP: 134/76  Pulse: 67  Temp: 98.1 F (36.7 C)  SpO2: 98%    Physical Exam Constitutional:      Appearance: Normal appearance. He is obese.  HENT:     Head: Normocephalic.  Eyes:     Extraocular Movements: Extraocular movements intact.  Cardiovascular:     Rate and Rhythm: Normal rate.  Pulmonary:     Effort: Pulmonary effort is normal.  Abdominal:     Palpations: Abdomen is soft.     Tenderness: There is no abdominal tenderness. There is no right CVA tenderness or left CVA tenderness.  Musculoskeletal:     Lumbar back: Spasms and tenderness present. No bony tenderness. Decreased range of motion.  Skin:    General: Skin is warm and dry.     Capillary Refill: Capillary refill takes less than 2 seconds.  Neurological:     General: No focal deficit present.     Mental Status: He is alert and oriented to person, place, and time.     Coordination: Coordination normal.     Gait: Gait normal.  Psychiatric:        Mood and Affect: Mood normal.        Behavior: Behavior normal.      ASSESSMENT & PLAN: A total of 35 minutes was spent with the patient and counseling/coordination of care regarding preparing for this visit, review of most recent office visit notes, review of most recent  lumbar spine MRI report, review of chronic medical conditions under management, review of all medications, diagnosis of lumbar strain and management, pain management, prognosis, documentation, and need for follow-up.   Problem List Items Addressed This Visit       Musculoskeletal and Integument   Acute lumbar myofascial strain - Primary    Acute and affecting quality of life.  Limited range of motion Limiting ability to work Pain management discussed Recommend to rest and use heat pad frequently Toradol 60 mg IM given today Recommend to start Medrol Dosepak with 10 mg of Flexeril at bedtime and tramadol 50 mg as needed for pain Orthopedic evaluation if no better in 1 to 2 weeks No work till next Monday      Relevant Medications   ketorolac (TORADOL) injection 60 mg   Degenerative disc disease, lumbar    Chronic degenerative changes Contributing to present pain Pain management discussed      Relevant Medications   ketorolac (TORADOL) injection 60 mg   methylPREDNISolone (MEDROL DOSEPAK) 4 MG TBPK tablet   cyclobenzaprine (FLEXERIL) 10 MG tablet   traMADol (ULTRAM) 50 MG tablet     Other   Lumbar pain    Pain management discussed IM NSAID given in the office Recommend to start corticosteroids with muscle relaxants and tramadol as needed analgesic Recommend to rest and use heat pad frequently      Relevant Medications   ketorolac (TORADOL) injection 60 mg   methylPREDNISolone (MEDROL DOSEPAK) 4 MG TBPK tablet   cyclobenzaprine (FLEXERIL) 10 MG tablet   traMADol (ULTRAM) 50 MG tablet   Patient Instructions  Acute Back Pain, Adult Acute back pain is sudden and usually short-lived. It is often caused by an injury to the muscles and tissues in the back. The injury may result from: A muscle, tendon, or ligament getting overstretched or torn. Ligaments are tissues that connect bones to  each other. Lifting something improperly can cause a back strain. Wear and tear  (degeneration) of the spinal disks. Spinal disks are circular tissue that provide cushioning between the bones of the spine (vertebrae). Twisting motions, such as while playing sports or doing yard work. A hit to the back. Arthritis. You may have a physical exam, lab tests, and imaging tests to find the cause of your pain. Acute back pain usually goes away with rest and home care. Follow these instructions at home: Managing pain, stiffness, and swelling Take over-the-counter and prescription medicines only as told by your health care provider. Treatment may include medicines for pain and inflammation that are taken by mouth or applied to the skin, or muscle relaxants. Your health care provider may recommend applying ice during the first 24-48 hours after your pain starts. To do this: Put ice in a plastic bag. Place a towel between your skin and the bag. Leave the ice on for 20 minutes, 2-3 times a day. Remove the ice if your skin turns bright red. This is very important. If you cannot feel pain, heat, or cold, you have a greater risk of damage to the area. If directed, apply heat to the affected area as often as told by your health care provider. Use the heat source that your health care provider recommends, such as a moist heat pack or a heating pad. Place a towel between your skin and the heat source. Leave the heat on for 20-30 minutes. Remove the heat if your skin turns bright red. This is especially important if you are unable to feel pain, heat, or cold. You have a greater risk of getting burned. Activity  Do not stay in bed. Staying in bed for more than 1-2 days can delay your recovery. Sit up and stand up straight. Avoid leaning forward when you sit or hunching over when you stand. If you work at a desk, sit close to it so you do not need to lean over. Keep your chin tucked in. Keep your neck drawn back, and keep your elbows bent at a 90-degree angle (right angle). Sit high and close to  the steering wheel when you drive. Add lower back (lumbar) support to your car seat, if needed. Take short walks on even surfaces as soon as you are able. Try to increase the length of time you walk each day. Do not sit, drive, or stand in one place for more than 30 minutes at a time. Sitting or standing for long periods of time can put stress on your back. Do not drive or use heavy machinery while taking prescription pain medicine. Use proper lifting techniques. When you bend and lift, use positions that put less stress on your back: [place] your knees. Keep the load close to your body. Avoid twisting. Exercise regularly as told by your health care provider. Exercising helps your back heal faster and helps prevent back injuries by keeping muscles strong and flexible. Work with a physical therapist to make a safe exercise program, as recommended by your health care provider. Do any exercises as told by your physical therapist. Lifestyle Maintain a healthy weight. Extra weight puts stress on your back and makes it difficult to have good posture. Avoid activities or situations that make you feel anxious or stressed. Stress and anxiety increase muscle tension and can make back pain worse. Learn ways to manage anxiety and stress, such as through exercise. General instructions Sleep on a firm mattress in a comfortable position. Try  lying on your side with your knees slightly bent. If you lie on your back, put a pillow under your knees. Keep your head and neck in a straight line with your spine (neutral position) when using electronic equipment like smartphones or pads. To do this: Raise your smartphone or pad to look at it instead of bending your head or neck to look down. Put the smartphone or pad at the level of your face while looking at the screen. Follow your treatment plan as told by your health care provider. This may include: Cognitive or behavioral therapy. Acupuncture or massage  therapy. Meditation or yoga. Contact a health care provider if: You have pain that is not relieved with rest or medicine. You have increasing pain going down into your legs or buttocks. Your pain does not improve after 2 weeks. You have pain at night. You lose weight without trying. You have a fever or chills. You develop nausea or vomiting. You develop abdominal pain. Get help right away if: You develop new bowel or bladder control problems. You have unusual weakness or numbness in your arms or legs. You feel faint. These symptoms may represent a serious problem that is an emergency. Do not wait to see if the symptoms will go away. Get medical help right away. Call your local emergency services (911 in the [place] Do not drive yourself to the hospital. Summary Acute back pain is sudden and usually short-lived. Use proper lifting techniques. When you bend and lift, use positions that put less stress on your back. Take over-the-counter and prescription medicines only as told by your health care provider, and apply heat or ice as told. This information is not intended to replace advice given to you by your health care provider. Make sure you discuss any questions you have with your health care provider. Document Revised: 05/23/2020 Document Reviewed: 05/23/2020 Elsevier Patient Education  2023 Elsevier Inc.      Edwina Barth, MD  [facility]

## 2022-06-30 NOTE — Patient Instructions (Signed)
Acute Back Pain, Adult Acute back pain is sudden and usually short-lived. It is often caused by an injury to the muscles and tissues in the back. The injury may result from: A muscle, tendon, or ligament getting overstretched or torn. Ligaments are tissues that connect bones to each other. Lifting something improperly can cause a back strain. Wear and tear (degeneration) of the spinal disks. Spinal disks are circular tissue that provide cushioning between the bones of the spine (vertebrae). Twisting motions, such as while playing sports or doing yard work. A hit to the back. Arthritis. You may have a physical exam, lab tests, and imaging tests to find the cause of your pain. Acute back pain usually goes away with rest and home care. Follow these instructions at home: Managing pain, stiffness, and swelling Take over-the-counter and prescription medicines only as told by your health care provider. Treatment may include medicines for pain and inflammation that are taken by mouth or applied to the skin, or muscle relaxants. Your health care provider may recommend applying ice during the first 24-48 hours after your pain starts. To do this: Put ice in a plastic bag. Place a towel between your skin and the bag. Leave the ice on for 20 minutes, 2-3 times a day. Remove the ice if your skin turns bright red. This is very important. If you cannot feel pain, heat, or cold, you have a greater risk of damage to the area. If directed, apply heat to the affected area as often as told by your health care provider. Use the heat source that your health care provider recommends, such as a moist heat pack or a heating pad. Place a towel between your skin and the heat source. Leave the heat on for 20-30 minutes. Remove the heat if your skin turns bright red. This is especially important if you are unable to feel pain, heat, or cold. You have a greater risk of getting burned. Activity  Do not stay in bed. Staying in  bed for more than 1-2 days can delay your recovery. Sit up and stand up straight. Avoid leaning forward when you sit or hunching over when you stand. If you work at a desk, sit close to it so you do not need to lean over. Keep your chin tucked in. Keep your neck drawn back, and keep your elbows bent at a 90-degree angle (right angle). Sit high and close to the steering wheel when you drive. Add lower back (lumbar) support to your car seat, if needed. Take short walks on even surfaces as soon as you are able. Try to increase the length of time you walk each day. Do not sit, drive, or stand in one place for more than 30 minutes at a time. Sitting or standing for long periods of time can put stress on your back. Do not drive or use heavy machinery while taking prescription pain medicine. Use proper lifting techniques. When you bend and lift, use positions that put less stress on your back: Bend your knees. Keep the load close to your body. Avoid twisting. Exercise regularly as told by your health care provider. Exercising helps your back heal faster and helps prevent back injuries by keeping muscles strong and flexible. Work with a physical therapist to make a safe exercise program, as recommended by your health care provider. Do any exercises as told by your physical therapist. Lifestyle Maintain a healthy weight. Extra weight puts stress on your back and makes it difficult to have good   posture. Avoid activities or situations that make you feel anxious or stressed. Stress and anxiety increase muscle tension and can make back pain worse. Learn ways to manage anxiety and stress, such as through exercise. General instructions Sleep on a firm mattress in a comfortable position. Try lying on your side with your knees slightly bent. If you lie on your back, put a pillow under your knees. Keep your head and neck in a straight line with your spine (neutral position) when using electronic equipment like  smartphones or pads. To do this: Raise your smartphone or pad to look at it instead of bending your head or neck to look down. Put the smartphone or pad at the level of your face while looking at the screen. Follow your treatment plan as told by your health care provider. This may include: Cognitive or behavioral therapy. Acupuncture or massage therapy. Meditation or yoga. Contact a health care provider if: You have pain that is not relieved with rest or medicine. You have increasing pain going down into your legs or buttocks. Your pain does not improve after 2 weeks. You have pain at night. You lose weight without trying. You have a fever or chills. You develop nausea or vomiting. You develop abdominal pain. Get help right away if: You develop new bowel or bladder control problems. You have unusual weakness or numbness in your arms or legs. You feel faint. These symptoms may represent a serious problem that is an emergency. Do not wait to see if the symptoms will go away. Get medical help right away. Call your local emergency services (911 in the [place] Do not drive yourself to the hospital. Summary Acute back pain is sudden and usually short-lived. Use proper lifting techniques. When you bend and lift, use positions that put less stress on your back. Take over-the-counter and prescription medicines only as told by your health care provider, and apply heat or ice as told. This information is not intended to replace advice given to you by your health care provider. Make sure you discuss any questions you have with your health care provider. Document Revised: 05/23/2020 Document Reviewed: 05/23/2020 Elsevier Patient Education  2023 [employer] Inc[employer]  

## 2022-06-30 NOTE — Assessment & Plan Note (Signed)
Pain management discussed IM NSAID given in the office Recommend to start corticosteroids with muscle relaxants and tramadol as needed analgesic Recommend to rest and use heat pad frequently

## 2022-06-30 NOTE — Assessment & Plan Note (Signed)
Chronic degenerative changes Contributing to present pain Pain management discussed

## 2022-06-30 NOTE — Assessment & Plan Note (Signed)
Acute and affecting quality of life.  Limited range of motion Limiting ability to work Pain management discussed Recommend to rest and use heat pad frequently Toradol 60 mg IM given today Recommend to start Medrol Dosepak with 10 mg of Flexeril at bedtime and tramadol 50 mg as needed for pain Orthopedic evaluation if no better in 1 to 2 weeks No work till next Monday

## 2022-07-05 ENCOUNTER — Other Ambulatory Visit: Payer: Self-pay | Admitting: Emergency Medicine

## 2022-07-05 ENCOUNTER — Other Ambulatory Visit (HOSPITAL_COMMUNITY): Payer: Self-pay

## 2022-07-05 DIAGNOSIS — I10 Essential (primary) hypertension: Secondary | ICD-10-CM

## 2022-07-05 DIAGNOSIS — E785 Hyperlipidemia, unspecified: Secondary | ICD-10-CM

## 2022-07-05 MED ORDER — AMLODIPINE BESYLATE 5 MG PO TABS
5.0000 mg | ORAL_TABLET | Freq: Every day | ORAL | 0 refills | Status: DC
Start: 2022-07-05 — End: 2022-10-11
  Filled 2022-07-05: qty 90, 90d supply, fill #0

## 2022-07-05 MED ORDER — ATORVASTATIN CALCIUM 20 MG PO TABS
20.0000 mg | ORAL_TABLET | Freq: Every day | ORAL | 0 refills | Status: DC
Start: 2022-07-05 — End: 2022-10-11
  Filled 2022-07-05: qty 90, 90d supply, fill #0

## 2022-07-13 ENCOUNTER — Encounter: Payer: Self-pay | Admitting: Internal Medicine

## 2022-07-13 ENCOUNTER — Other Ambulatory Visit (HOSPITAL_COMMUNITY): Payer: Self-pay

## 2022-07-13 ENCOUNTER — Ambulatory Visit (INDEPENDENT_AMBULATORY_CARE_PROVIDER_SITE_OTHER): Payer: 59 | Admitting: Internal Medicine

## 2022-07-13 VITALS — BP 120/62 | HR 68 | Ht 74.0 in | Wt 271.6 lb

## 2022-07-13 DIAGNOSIS — E1169 Type 2 diabetes mellitus with other specified complication: Secondary | ICD-10-CM

## 2022-07-13 DIAGNOSIS — Z794 Long term (current) use of insulin: Secondary | ICD-10-CM

## 2022-07-13 DIAGNOSIS — E1141 Type 2 diabetes mellitus with diabetic mononeuropathy: Secondary | ICD-10-CM

## 2022-07-13 DIAGNOSIS — E785 Hyperlipidemia, unspecified: Secondary | ICD-10-CM | POA: Diagnosis not present

## 2022-07-13 LAB — POCT GLYCOSYLATED HEMOGLOBIN (HGB A1C): Hemoglobin A1C: 8.1 % — AB (ref 4.0–5.6)

## 2022-07-13 MED ORDER — ONETOUCH VERIO FLEX SYSTEM W/DEVICE KIT
PACK | 0 refills | Status: AC
  Filled 2022-07-13: qty 1, 30d supply, fill #0

## 2022-07-13 MED ORDER — ONETOUCH VERIO VI STRP
ORAL_STRIP | 3 refills | Status: DC
  Filled 2022-07-13: qty 300, 90d supply, fill #0
  Filled 2022-07-13: qty 200, 67d supply, fill #0
  Filled 2022-11-23: qty 200, 67d supply, fill #1
  Filled 2023-03-15: qty 200, 67d supply, fill #2

## 2022-07-13 MED ORDER — ONETOUCH DELICA LANCETS 33G MISC
3 refills | Status: AC
  Filled 2022-07-13: qty 300, 90d supply, fill #0
  Filled 2022-07-13: qty 200, 67d supply, fill #0

## 2022-07-13 MED ORDER — GLIPIZIDE 5 MG PO TABS
5.0000 mg | ORAL_TABLET | Freq: Every day | ORAL | 3 refills | Status: DC
  Filled 2022-07-13: qty 90, 90d supply, fill #0
  Filled 2022-10-19: qty 90, 90d supply, fill #1

## 2022-07-13 NOTE — Progress Notes (Signed)
Patient ID: James Garcia, male   DOB: 1956-02-23, 67 y.o.   MRN: [id number]  HPI: James Garcia is a 67 y.o.-year-old male, returning for follow-up for DM2, dx in 2007, insulin-dependent since 2016, fairly well controlled, with complications (diabetic retinopathy, peripheral neuropathy). Pt. previously saw Dr. Everardo All, but last visit with me 4 mo ago.  Interim history: No increased urination, blurry vision, nausea, chest pain.  He has muscle cramps at night. He had a steroid inj  2 weeks ago >> higher CBGs.  Reviewed HbA1c: Lab Results  Component Value Date   HGBA1C 7.2 (A) 03/11/2022   HGBA1C 7.1 (A) 11/03/2021   HGBA1C 6.8 (A) 09/28/2021   HGBA1C 7.0 (A) 03/23/2021   HGBA1C 7.5 (A) 12/08/2020   HGBA1C 6.2 (A) 09/01/2020   HGBA1C 7.6 (A) 04/21/2020   HGBA1C 7.3 (H) 11/20/2019   HGBA1C 7.0 (A) 08/16/2019   HGBA1C 7.2 (A) 04/04/2019   Pt is on a regimen of: - Metformin ER 1000 mg 2x a day, with meals - Semglee 55 units in am - long needles (8 mm) >> Tresiba U200 50 units daily. - Ozempic 2 mg weekly He tried [place] >> rash.  Pt checks his sugars 2x a day and they are - date in meter is off: - am: 94-173, 218 >> 67, 72-145, 180, 217 >> 94-170, 218 - 2h after b'fast: n/c - before lunch: 156 - 2h after lunch: n/c - before dinner: n/c - 2h after dinner: n/c - bedtime: 87, 100-207 >> 107-187 >> 87, 100-207 - nighttime: n/c Lowest sugar was 87 >> 67 >> 87; he has hypoglycemia awareness at 70s.  Highest sugar was 218 >> 220 >> 218.  Glucometer: Freestyle Lite  Pt's meals are: - Breakfast: fried eggs + toast and coffee - Lunch: skips - Dinner: 7-8 am: meat + veggies (cafeteria at [facility]) - bedtime snack: chicken or fried egg  - no CKD, last BUN/creatinine:  Lab Results  Component Value Date   BUN 15 09/28/2021   BUN 15 11/10/2020   CREATININE 0.79 09/28/2021   CREATININE 0.86 11/10/2020  On Cozaar 100 mg daily.  - + HL; last set of lipids: Lab Results   Component Value Date   CHOL 127 09/28/2021   HDL 49.20 09/28/2021   LDLCALC 66 09/28/2021   TRIG 58.0 09/28/2021   CHOLHDL 3 09/28/2021  On Lipitor 20 mg daily.  - last eye exam was in 07/2021. + DR reportedly.   - + numbness and tingling in his feet.  Last foot exam 03/11/2022. On Neurontin 200 mg 2x a day.  He has a history of HTN, constipation, history of gallbladder stone, history of TB.  ROS: + see HPI  Past Medical History:  Diagnosis Date   Allergy    "IN MORNING SNEEZE   Bronchitis    CHRONIC   Constipation    uses OTC laxatives - hard stools every morning- small amount and feels like doesnt empty    Diabetes mellitus without complication (HCC)    DM type 2 (diabetes mellitus, type 2) (HCC)    GERD (gastroesophageal reflux disease)    Hyperlipidemia    Hypertension    Leg cramps    Tuberculin skin test (TST) positive    Tuberculosis    pt states was treated for TB   Past Surgical History:  Procedure Laterality Date   arm surgery     COLONOSCOPY     ELBOW SURGERY Right    FOOT SURGERY  Bilateral    POLYPECTOMY     Social History   Socioeconomic History   Marital status: Married    Spouse name: Not on file   Number of children: 2   Years of education: Not on file   Highest education level: Not on file  Occupational History   Occupation: Floor Tech at [employer]   Tobacco Use   Smoking status: Former    Packs/day: 0.25    Years: 5.00    Additional pack years: 0.00    Total pack years: 1.25    Types: Cigarettes    Quit date: 06/04/1978    Years since quitting: 44.1    Passive exposure: Never   Smokeless tobacco: Never  Vaping Use   Vaping Use: Never used  Substance and Sexual Activity   Alcohol use: No   Drug use: No   Sexual activity: Not on file  Other Topics Concern   Not on file  Social History Narrative   Lives with family       Social Determinants of Health   Financial Resource Strain: Not on file  Food Insecurity: Not on file   Transportation Needs: Not on file  Physical Activity: Not on file  Stress: Not on file  Social Connections: Not on file  Intimate Partner Violence: Not on file   Current Outpatient Medications on File Prior to Visit  Medication Sig Dispense Refill   amLODipine (NORVASC) 5 MG tablet Take 1 tablet (5 mg total) by mouth daily. 90 tablet 0   atorvastatin (LIPITOR) 20 MG tablet Take 1 tablet (20 mg total) by mouth daily. 90 tablet 0   Blood Glucose Monitoring Suppl (FREESTYLE LITE) w/Device KIT Use to test blood sugar daily 1 kit 0   Blood Pressure Monitor KIT Use as instructed for HTN (Patient not taking: Reported on 06/30/2022) 1 each 0   cetirizine (ZYRTEC) 10 MG tablet Take 10 mg by mouth as needed.     fluticasone (FLONASE) 50 MCG/ACT nasal spray Place 1-2 sprays into both nostrils daily. (Patient not taking: Reported on 12/01/2021) 16 g 0   gabapentin (NEURONTIN) 100 MG capsule Take 1 capsule (100 mg total) by mouth 2 (two) times daily. 180 capsule 0   glucose blood (FREESTYLE LITE) test strip Use as instructed 2 times daily 200 each 12   insulin degludec (TRESIBA FLEXTOUCH) 200 UNIT/ML FlexTouch Pen Inject 50 Units into the skin daily. 9 mL 3   Insulin Pen Needle 32G X 4 MM MISC use once daily with insulin 100 each 3   Lancets (FREESTYLE) lancets USE TO CHECK BLOOD SUGAR 2 TIMES PER DAY. 200 each 2   losartan (COZAAR) 100 MG tablet Take 1 tablet (100 mg total) by mouth daily. 90 tablet 0   metFORMIN (GLUCOPHAGE-XR) 500 MG 24 hr tablet Take 2 tablets (1,000 mg total) by mouth daily with breakfast. 180 tablet 0   methylPREDNISolone (MEDROL DOSEPAK) 4 MG TBPK tablet Take as directed 21 tablet 1   neomycin-bacitracin-polymyxin (NEOSPORIN) ointment Apply 1 application topically every 12 (twelve) hours. 15 g 0   omeprazole (PRILOSEC) 20 MG capsule Take 1 capsule (20 mg total) by mouth every morning. 90 capsule 0   pramipexole (MIRAPEX) 0.5 MG tablet Take 1 tablet (0.5 mg total) by mouth 3 (three)  times daily. (Patient not taking: Reported on 06/30/2022) 30 tablet 5   Semaglutide, 2 MG/DOSE, (OZEMPIC, 2 MG/DOSE,) 8 MG/3ML SOPN Inject 2 mg into the skin once a week. 9 mL 3   [DISCONTINUED]  albuterol (PROVENTIL HFA;VENTOLIN HFA) 108 (90 Base) MCG/ACT inhaler Inhale 1-2 puffs into the lungs every 4 (four) hours as needed for wheezing or shortness of breath. 1 Inhaler 0   Current Facility-Administered Medications on File Prior to Visit  Medication Dose Route Frequency Provider Last Rate Last Admin   0.9 %  sodium chloride infusion  500 mL Intravenous Once Pyrtle, Carie Caddy, MD       Allergies  Allergen Reactions   Insulin Glargine Hives    specifically Semglee   Family History  Problem Relation Age of Onset   Diabetes Father    Colon cancer Neg Hx    Colon polyps Neg Hx    Rectal cancer Neg Hx    Stomach cancer Neg Hx    Crohn's disease Neg Hx    Esophageal cancer Neg Hx    Ulcerative colitis Neg Hx    PE: BP 120/62 (BP Location: Right Arm, Patient Position: Sitting, Cuff Size: Normal)   Pulse 68   Ht 6\' 2"  (1.88 m)   Wt 271 lb 9.6 oz (123.2 kg)   SpO2 99%   BMI 34.87 kg/m  Wt Readings from Last 3 Encounters:  07/13/22 271 lb 9.6 oz (123.2 kg)  06/30/22 273 lb 4 oz (123.9 kg)  03/11/22 271 lb 6.4 oz (123.1 kg)   Constitutional: overweight, in NAD Eyes: no exophthalmos ENT: no thyromegaly, no cervical lymphadenopathy Cardiovascular: RRR, No MRG, + mild B pitting periankle edema Respiratory: CTA B Musculoskeletal: + deformities - L elbow deformity after being dislocated and healing in the wrong position Skin:  no rashes Neurological: no tremor with outstretched hands  ASSESSMENT: 1. DM2, insulin-dependent, uncontrolled, with complications - DR - PN  2. HL  PLAN:  1. Patient with longstanding, uncontrolled, type 2 diabetes, on oral antidiabetic regimen with metformin and weekly GLP-1 receptor agonist and long-acting insulin, with still suboptimal control.  At last  visit, HbA1c was higher, at 7.2%.  Sugars were fluctuating with most values at goal but still with hyperglycemic exceptions and also occasional blood sugars in the 60s.  We did not change his regimen but recommended to limit dietary indiscretions. -At today's visit, many of his blood sugars are at goal, but he also has spikes up to the 200s.  These are likely due to dietary indiscretions and we again discussed about reducing these.  We also discussed about starting glipizide before larger meals and I explained what constitutes a large meal.  Will continue the rest of the regimen. -He has more numbness, tingling, and burning in his feet and also some swelling in his feet.  He is wondering whether he needs shoe inserts.  Recommended to see podiatry.  He is interested in this.  I also recommended alpha lipoid acid. - I suggested to:  Patient Instructions  Please continue: - Metformin ER 1000 mg 2x a day, with meals - Ozempic 2 mg weekly - Tresiba U200 50 units daily  Try to use: - Glipizide 5 mg before a larger meal.  Stop fried foods and reduce the snack before bed.  Change the date on your meter.  Try to establish care with podiatry ([facility]).  Try alpha lipoic acid 600 mg 2x a day.  Please return in 4 months with your sugar log.   - we checked his HbA1c: 8.1% (higher) - advised to check sugars at different times of the day - 1x a day, rotating check times - advised for yearly eye exams >> he is  UTD - return to clinic in 3-4 months  2. HL -Reviewed latest lipid panel from 09/2021: Fractions at goal: Lab Results  Component Value Date   CHOL 127 09/28/2021   HDL 49.20 09/28/2021   LDLCALC 66 09/28/2021   TRIG 58.0 09/28/2021   CHOLHDL 3 09/28/2021  -He continues on Lipitor 20 mg daily.  He has muscle cramps, unclear if from Lipitor.  We discussed about staying hydrated and being a magnesium supplement or multivitamins.  Carlus Pavlov, MD PhD [facility] Endocrinology

## 2022-07-13 NOTE — Patient Instructions (Addendum)
Please continue: - Metformin ER 1000 mg 2x a day, with meals - Ozempic 2 mg weekly - Tresiba U200 50 units daily  Try to use: - Glipizide 5 mg before a larger meal.  Stop fried foods and reduce the snack before bed.  Change the date on your meter.  Try to establish care with podiatry ([facility]).  Try alpha lipoic acid 600 mg 2x a day.  Please return in 4 months with your sugar log.

## 2022-07-22 ENCOUNTER — Other Ambulatory Visit (HOSPITAL_COMMUNITY): Payer: Self-pay

## 2022-07-27 ENCOUNTER — Other Ambulatory Visit: Payer: Self-pay | Admitting: Internal Medicine

## 2022-07-27 ENCOUNTER — Other Ambulatory Visit (HOSPITAL_COMMUNITY): Payer: Self-pay

## 2022-07-27 MED ORDER — METFORMIN HCL ER 500 MG PO TB24
1000.0000 mg | ORAL_TABLET | Freq: Two times a day (BID) | ORAL | 3 refills | Status: DC
  Filled 2022-07-27: qty 360, 90d supply, fill #0
  Filled 2022-11-23: qty 360, 90d supply, fill #1

## 2022-08-12 ENCOUNTER — Ambulatory Visit: Payer: 59 | Admitting: Emergency Medicine

## 2022-08-17 ENCOUNTER — Other Ambulatory Visit (HOSPITAL_COMMUNITY): Payer: Self-pay

## 2022-08-17 ENCOUNTER — Other Ambulatory Visit: Payer: Self-pay | Admitting: Internal Medicine

## 2022-08-17 MED ORDER — TRESIBA FLEXTOUCH 200 UNIT/ML ~~LOC~~ SOPN
50.0000 [IU] | PEN_INJECTOR | Freq: Every day | SUBCUTANEOUS | 1 refills | Status: DC
  Filled 2022-08-17: qty 9, 36d supply, fill #0
  Filled 2022-08-19: qty 6, 24d supply, fill #0
  Filled 2022-09-10: qty 6, 24d supply, fill #1
  Filled 2022-10-05: qty 6, 24d supply, fill #2

## 2022-08-19 ENCOUNTER — Other Ambulatory Visit (HOSPITAL_COMMUNITY): Payer: Self-pay

## 2022-08-24 ENCOUNTER — Other Ambulatory Visit: Payer: Self-pay | Admitting: Emergency Medicine

## 2022-08-24 ENCOUNTER — Other Ambulatory Visit (HOSPITAL_COMMUNITY): Payer: Self-pay

## 2022-08-24 DIAGNOSIS — G6289 Other specified polyneuropathies: Secondary | ICD-10-CM

## 2022-08-24 MED ORDER — GABAPENTIN 100 MG PO CAPS
100.0000 mg | ORAL_CAPSULE | Freq: Two times a day (BID) | ORAL | 0 refills | Status: DC
Start: 2022-08-24 — End: 2022-11-23
  Filled 2022-08-24 – 2022-08-25 (×2): qty 180, 90d supply, fill #0

## 2022-08-25 ENCOUNTER — Other Ambulatory Visit (HOSPITAL_COMMUNITY): Payer: Self-pay

## 2022-09-06 ENCOUNTER — Other Ambulatory Visit (HOSPITAL_COMMUNITY): Payer: Self-pay

## 2022-09-10 ENCOUNTER — Other Ambulatory Visit (HOSPITAL_COMMUNITY): Payer: Self-pay

## 2022-09-10 ENCOUNTER — Other Ambulatory Visit: Payer: Self-pay

## 2022-09-15 ENCOUNTER — Other Ambulatory Visit (HOSPITAL_COMMUNITY): Payer: Self-pay

## 2022-10-02 ENCOUNTER — Other Ambulatory Visit: Payer: Self-pay | Admitting: Emergency Medicine

## 2022-10-03 MED ORDER — OMEPRAZOLE 20 MG PO CPDR
20.0000 mg | DELAYED_RELEASE_CAPSULE | Freq: Every morning | ORAL | 0 refills | Status: DC
  Filled 2022-10-03: qty 90, 90d supply, fill #0

## 2022-10-04 ENCOUNTER — Other Ambulatory Visit (HOSPITAL_COMMUNITY): Payer: Self-pay

## 2022-10-05 ENCOUNTER — Other Ambulatory Visit (HOSPITAL_COMMUNITY): Payer: Self-pay

## 2022-10-11 ENCOUNTER — Other Ambulatory Visit: Payer: Self-pay | Admitting: Emergency Medicine

## 2022-10-11 ENCOUNTER — Other Ambulatory Visit (HOSPITAL_COMMUNITY): Payer: Self-pay

## 2022-10-11 DIAGNOSIS — E785 Hyperlipidemia, unspecified: Secondary | ICD-10-CM

## 2022-10-11 DIAGNOSIS — I10 Essential (primary) hypertension: Secondary | ICD-10-CM

## 2022-10-11 MED ORDER — ATORVASTATIN CALCIUM 20 MG PO TABS
20.0000 mg | ORAL_TABLET | Freq: Every day | ORAL | 0 refills | Status: DC
Start: 2022-10-11 — End: 2023-01-05
  Filled 2022-10-11: qty 90, 90d supply, fill #0

## 2022-10-11 MED ORDER — AMLODIPINE BESYLATE 5 MG PO TABS
5.0000 mg | ORAL_TABLET | Freq: Every day | ORAL | 0 refills | Status: DC
Start: 2022-10-11 — End: 2023-01-05
  Filled 2022-10-11: qty 90, 90d supply, fill #0

## 2022-10-28 ENCOUNTER — Other Ambulatory Visit: Payer: Self-pay | Admitting: Internal Medicine

## 2022-10-28 ENCOUNTER — Other Ambulatory Visit (HOSPITAL_COMMUNITY): Payer: Self-pay

## 2022-10-28 MED ORDER — TRESIBA FLEXTOUCH 200 UNIT/ML ~~LOC~~ SOPN
50.0000 [IU] | PEN_INJECTOR | Freq: Every day | SUBCUTANEOUS | 1 refills | Status: DC
  Filled 2022-10-28: qty 6, 24d supply, fill #0
  Filled 2022-11-23: qty 6, 24d supply, fill #1
  Filled 2022-12-17: qty 6, 24d supply, fill #2

## 2022-11-01 ENCOUNTER — Other Ambulatory Visit (HOSPITAL_COMMUNITY): Payer: Self-pay

## 2022-11-09 ENCOUNTER — Other Ambulatory Visit (HOSPITAL_COMMUNITY): Payer: Self-pay

## 2022-11-16 ENCOUNTER — Encounter: Payer: Self-pay | Admitting: Internal Medicine

## 2022-11-16 ENCOUNTER — Ambulatory Visit (INDEPENDENT_AMBULATORY_CARE_PROVIDER_SITE_OTHER): Payer: 59 | Admitting: Internal Medicine

## 2022-11-16 VITALS — BP 138/70 | HR 66 | Ht 74.0 in | Wt 276.2 lb

## 2022-11-16 DIAGNOSIS — Z7984 Long term (current) use of oral hypoglycemic drugs: Secondary | ICD-10-CM

## 2022-11-16 DIAGNOSIS — E1141 Type 2 diabetes mellitus with diabetic mononeuropathy: Secondary | ICD-10-CM

## 2022-11-16 DIAGNOSIS — Z794 Long term (current) use of insulin: Secondary | ICD-10-CM | POA: Diagnosis not present

## 2022-11-16 DIAGNOSIS — Z7985 Long-term (current) use of injectable non-insulin antidiabetic drugs: Secondary | ICD-10-CM

## 2022-11-16 DIAGNOSIS — E7849 Other hyperlipidemia: Secondary | ICD-10-CM

## 2022-11-16 LAB — POCT GLYCOSYLATED HEMOGLOBIN (HGB A1C): Hemoglobin A1C: 7 % — AB (ref 4.0–5.6)

## 2022-11-16 NOTE — Patient Instructions (Addendum)
Please continue: - Metformin ER 1000 mg 2x a day, with meals - Ozempic 2 mg weekly - Tresiba U200 50 units daily  Please move: - Glipizide 5 mg 20 min before a larger meal  Try to establish care with podiatry ([facility]).  Schedule an appointment for an annual physical exam with Dr. Alvy Bimler.  Please return in 4 months with your sugar log.

## 2022-11-16 NOTE — Progress Notes (Signed)
Patient ID: James Garcia, male   DOB: 09-Sep-1955, 67 y.o.   MRN: [id number]  HPI: James Garcia is a 67 y.o.-year-old male, returning for follow-up for DM2, dx in 2007, insulin-dependent since 2016, uncontrolled, with complications (diabetic retinopathy, peripheral neuropathy). Pt. previously saw Dr. Everardo All, but last visit with me 4 mo ago.  Interim history: No increased urination, blurry vision, nausea, chest pain.  He has muscle cramps at night. He stopped the snack at night.  He still eats fried foods, but seldom. He works from 3 PM to 11 PM.  Reviewed HbA1c: Lab Results  Component Value Date   HGBA1C 8.1 (A) 07/13/2022   HGBA1C 7.2 (A) 03/11/2022   HGBA1C 7.1 (A) 11/03/2021   HGBA1C 6.8 (A) 09/28/2021   HGBA1C 7.0 (A) 03/23/2021   HGBA1C 7.5 (A) 12/08/2020   HGBA1C 6.2 (A) 09/01/2020   HGBA1C 7.6 (A) 04/21/2020   HGBA1C 7.3 (H) 11/20/2019   HGBA1C 7.0 (A) 08/16/2019   Pt is on a regimen of: - Metformin ER 1000 mg 2x a day, with meals - Glipizide 5 mg before a larger meal-started 06/2022 but started to take it at bedtime - Semglee 55 units in am - long needles (8 mm) >> Tresiba U200 50 units daily. - Ozempic 2 mg weekly He tried [place] >> rash.  Pt checks his sugars 2x a day and they are: - am: 94-173, 218 >> 67, 72-145, 180, 217 >> 94-170, 218 >> 79-146, 179 - 2h after b'fast: n/c - before lunch: 156 - 2h after lunch: n/c - before dinner: n/c - 2h after dinner: n/c - bedtime: 87, 100-207 >> 107-187 >> 87, 100-207 >> 57, 88-188, 276 - nighttime: n/c Lowest sugar was 87 >> 67 >> 87 >> 57; he has hypoglycemia awareness at 70s.  Highest sugar was 218 >> 220 >> 218 >> 276.  Glucometer: Freestyle Lite  Pt's meals are: - Breakfast: fried eggs + toast and coffee - Lunch: skips - Dinner: 7-8 am: meat + veggies (cafeteria at [facility]) - bedtime snack: chicken or fried egg  - no CKD, last BUN/creatinine:  Lab Results  Component Value Date   BUN 15 09/28/2021    BUN 15 11/10/2020   CREATININE 0.79 09/28/2021   CREATININE 0.86 11/10/2020   Lab Results  Component Value Date   MICRALBCREAT 1.1 09/28/2021   MICRALBCREAT 0.7 12/24/2016  On Cozaar 100 mg daily.  - + HL; last set of lipids: Lab Results  Component Value Date   CHOL 127 09/28/2021   HDL 49.20 09/28/2021   LDLCALC 66 09/28/2021   TRIG 58.0 09/28/2021   CHOLHDL 3 09/28/2021  On Lipitor 20 mg daily.  - last eye exam was in 07/2021. + DR reportedly.   - + numbness and tingling in his feet.  Last foot exam 03/11/2022. On Neurontin 200 mg 2x a day. He did not try the ALA.  He has a history of HTN, constipation, history of gallbladder stone, history of TB.  ROS: + see HPI  Past Medical History:  Diagnosis Date   Allergy    "IN MORNING SNEEZE   Bronchitis    CHRONIC   Constipation    uses OTC laxatives - hard stools every morning- small amount and feels like doesnt empty    Diabetes mellitus without complication (HCC)    DM type 2 (diabetes mellitus, type 2) (HCC)    GERD (gastroesophageal reflux disease)    Hyperlipidemia    Hypertension  Leg cramps    Tuberculin skin test (TST) positive    Tuberculosis    pt states was treated for TB   Past Surgical History:  Procedure Laterality Date   arm surgery     COLONOSCOPY     ELBOW SURGERY Right    FOOT SURGERY Bilateral    POLYPECTOMY     Social History   Socioeconomic History   Marital status: Married    Spouse name: Not on file   Number of children: 2   Years of education: Not on file   Highest education level: Not on file  Occupational History   Occupation: Floor Tech at [employer]   Tobacco Use   Smoking status: Former    Current packs/day: 0.00    Average packs/day: 0.3 packs/day for 5.0 years (1.3 ttl pk-yrs)    Types: Cigarettes    Start date: 06/03/1973    Quit date: 06/04/1978    Years since quitting: 44.4    Passive exposure: Never   Smokeless tobacco: Never  Vaping Use   Vaping status: Never Used   Substance and Sexual Activity   Alcohol use: No   Drug use: No   Sexual activity: Not on file  Other Topics Concern   Not on file  Social History Narrative   Lives with family       Social Determinants of Health   Financial Resource Strain: Not on file  Food Insecurity: Not on file  Transportation Needs: Not on file  Physical Activity: Not on file  Stress: Not on file  Social Connections: Not on file  Intimate Partner Violence: Not on file   Current Outpatient Medications on File Prior to Visit  Medication Sig Dispense Refill   amLODipine (NORVASC) 5 MG tablet Take 1 tablet (5 mg total) by mouth daily. 90 tablet 0   atorvastatin (LIPITOR) 20 MG tablet Take 1 tablet (20 mg total) by mouth daily. 90 tablet 0   Blood Glucose Monitoring Suppl (FREESTYLE LITE) w/Device KIT Use to test blood sugar daily 1 kit 0   Blood Glucose Monitoring Suppl (ONETOUCH VERIO FLEX SYSTEM) w/Device KIT Use as advised 1 kit 0   Blood Pressure Monitor KIT Use as instructed for HTN (Patient not taking: Reported on 06/30/2022) 1 each 0   cetirizine (ZYRTEC) 10 MG tablet Take 10 mg by mouth as needed.     fluticasone (FLONASE) 50 MCG/ACT nasal spray Place 1-2 sprays into both nostrils daily. (Patient not taking: Reported on 12/01/2021) 16 g 0   gabapentin (NEURONTIN) 100 MG capsule Take 1 capsule (100 mg total) by mouth 2 (two) times daily. 180 capsule 0   glipiZIDE (GLUCOTROL) 5 MG tablet Take 1 tablet (5 mg total) by mouth daily before supper. 90 tablet 3   glucose blood (ONETOUCH VERIO) test strip Use as instructed 3x a day 300 each 3   insulin degludec (TRESIBA FLEXTOUCH) 200 UNIT/ML FlexTouch Pen Inject 50 Units into the skin daily. 9 mL 1   Insulin Pen Needle 32G X 4 MM MISC use once daily with insulin 100 each 3   Lancets (FREESTYLE) lancets USE TO CHECK BLOOD SUGAR 2 TIMES PER DAY. 200 each 2   losartan (COZAAR) 100 MG tablet Take 1 tablet (100 mg total) by mouth daily. 90 tablet 0   metFORMIN  (GLUCOPHAGE-XR) 500 MG 24 hr tablet Take 2 tablets (1,000 mg total) by mouth 2 (two) times daily with a meal. 360 tablet 3   methylPREDNISolone (MEDROL DOSEPAK) 4 MG TBPK  tablet Take as directed 21 tablet 1   neomycin-bacitracin-polymyxin (NEOSPORIN) ointment Apply 1 application topically every 12 (twelve) hours. 15 g 0   omeprazole (PRILOSEC) 20 MG capsule Take 1 capsule (20 mg total) by mouth every morning. 90 capsule 0   OneTouch Delica Lancets 33G MISC Use 3x a day 300 each 3   pramipexole (MIRAPEX) 0.5 MG tablet Take 1 tablet (0.5 mg total) by mouth 3 (three) times daily. (Patient not taking: Reported on 06/30/2022) 30 tablet 5   Semaglutide, 2 MG/DOSE, (OZEMPIC, 2 MG/DOSE,) 8 MG/3ML SOPN Inject 2 mg into the skin once a week. 9 mL 3   [DISCONTINUED] albuterol (PROVENTIL HFA;VENTOLIN HFA) 108 (90 Base) MCG/ACT inhaler Inhale 1-2 puffs into the lungs every 4 (four) hours as needed for wheezing or shortness of breath. 1 Inhaler 0   Current Facility-Administered Medications on File Prior to Visit  Medication Dose Route Frequency Provider Last Rate Last Admin   0.9 %  sodium chloride infusion  500 mL Intravenous Once Pyrtle, Carie Caddy, MD       Allergies  Allergen Reactions   Insulin Glargine Hives    specifically Semglee   Family History  Problem Relation Age of Onset   Diabetes Father    Colon cancer Neg Hx    Colon polyps Neg Hx    Rectal cancer Neg Hx    Stomach cancer Neg Hx    Crohn's disease Neg Hx    Esophageal cancer Neg Hx    Ulcerative colitis Neg Hx    PE: There were no vitals taken for this visit. Wt Readings from Last 3 Encounters:  07/13/22 271 lb 9.6 oz (123.2 kg)  06/30/22 273 lb 4 oz (123.9 kg)  03/11/22 271 lb 6.4 oz (123.1 kg)   Constitutional: overweight, in NAD Eyes: no exophthalmos ENT: no thyromegaly, no cervical lymphadenopathy Cardiovascular: RRR, No MRG, + mild B pitting periankle edema Respiratory: CTA B Musculoskeletal: + deformities - L elbow  deformity after being dislocated and healing in the wrong position Skin:  no rashes Neurological: no tremor with outstretched hands  ASSESSMENT: 1. DM2, insulin-dependent, uncontrolled, with complications - DR - PN  2. HL  PLAN:  1. Patient with longstanding, uncontrolled, type 2 diabetes, on oral antidiabetic regimen with metformin, sulfonylurea, weekly GLP-1 receptor agonist and long-acting insulin, with suboptimal control.  At last visit, HbA1c was higher, at 8.1%.  At that time, many of his blood sugars were at goal but he had spikes up to 200s due to dietary indiscretions and we discussed about reducing these.  I also recommended glipizide before larger meals and we discussed about what constitutes a large meal.  We did not change his regimen.  He also describes numbness, tingling, and burning in his feet and also swelling.  He was wondering whether he needed shoe inserts.  I recommended to see a podiatrist.  I also recommended alpha lipoic acid. -At today's visit, sugars appear to be improved in the morning, with most of the blood sugars at goal.  At night, when he comes home from work, they may be at goal but he had 1 low blood sugar in the 50s and an high blood sugar in the 200s.  Upon questioning, he is taking glipizide at bedtime, as he forgot about the advised to take it before a larger meal.  I did advise him to move this approximately 20 minutes before a larger meal but otherwise I advised him to continue the same regimen. - I  suggested to:  Patient Instructions  Please continue: - Metformin ER 1000 mg 2x a day, with meals - Ozempic 2 mg weekly - Tresiba U200 50 units daily  Please move: - Glipizide 5 mg 20 min before a larger meal  Try to establish care with podiatry ([facility]).  Schedule an appointment for an annual physical exam with Dr. Alvy Bimler.  Please return in 4 months with your sugar log.   - we checked his HbA1c: 7.0% (improved) - advised to check sugars  at different times of the day - 1x a day, rotating check times - advised for yearly eye exams >> he is UTD - at last visit I recommended alpha lipoic acid 600 mg twice a day for neuropathy symptoms but he forgot and continues only on Neurontin for now.  I again recommended to see podiatry due to the significant neuropathy. - he is due for annual labs but he would want to schedule an APE with his PCP - return to clinic in 4 months  2. HL -Reviewed latest lipid panel from 09/2021: Fractions at goal: Lab Results  Component Value Date   CHOL 127 09/28/2021   HDL 49.20 09/28/2021   LDLCALC 66 09/28/2021   TRIG 58.0 09/28/2021   CHOLHDL 3 09/28/2021  -He is on Lipitor 20 mg daily.  He has muscle cramps but unclear if they are coming from the statin.  At last visit, I recommended staying well-hydrated and starting a magnesium supplement of multivitamins.  Carlus Pavlov, MD PhD [facility] Endocrinology

## 2022-11-23 ENCOUNTER — Other Ambulatory Visit: Payer: Self-pay | Admitting: Internal Medicine

## 2022-11-23 ENCOUNTER — Other Ambulatory Visit (HOSPITAL_COMMUNITY): Payer: Self-pay

## 2022-11-23 ENCOUNTER — Other Ambulatory Visit: Payer: Self-pay | Admitting: Emergency Medicine

## 2022-11-23 ENCOUNTER — Other Ambulatory Visit: Payer: Self-pay

## 2022-11-23 DIAGNOSIS — G6289 Other specified polyneuropathies: Secondary | ICD-10-CM

## 2022-11-23 MED ORDER — GABAPENTIN 100 MG PO CAPS
100.0000 mg | ORAL_CAPSULE | Freq: Two times a day (BID) | ORAL | 0 refills | Status: DC
Start: 2022-11-23 — End: 2023-02-28
  Filled 2022-11-23: qty 180, 90d supply, fill #0

## 2022-11-23 MED ORDER — TECHLITE PLUS PEN NEEDLES 32G X 4 MM MISC
1.0000 | Freq: Every day | 3 refills | Status: DC
Start: 2022-11-23 — End: 2024-01-17
  Filled 2022-11-23: qty 100, 90d supply, fill #0
  Filled 2023-03-15: qty 100, 90d supply, fill #1
  Filled 2023-06-30: qty 100, 90d supply, fill #2
  Filled 2023-10-10: qty 100, 90d supply, fill #3

## 2022-11-26 NOTE — Addendum Note (Signed)
Addended by: Pollie Meyer on: 11/26/2022 09:14 AM   Modules accepted: Orders

## 2022-11-29 ENCOUNTER — Ambulatory Visit: Payer: 59 | Admitting: Emergency Medicine

## 2022-11-29 ENCOUNTER — Encounter: Payer: Self-pay | Admitting: Emergency Medicine

## 2022-11-29 VITALS — BP 130/70 | HR 60 | Temp 98.1°F | Ht 74.0 in | Wt 278.1 lb

## 2022-11-29 DIAGNOSIS — E1169 Type 2 diabetes mellitus with other specified complication: Secondary | ICD-10-CM

## 2022-11-29 DIAGNOSIS — Z23 Encounter for immunization: Secondary | ICD-10-CM

## 2022-11-29 DIAGNOSIS — Z125 Encounter for screening for malignant neoplasm of prostate: Secondary | ICD-10-CM | POA: Diagnosis not present

## 2022-11-29 DIAGNOSIS — Z7985 Long-term (current) use of injectable non-insulin antidiabetic drugs: Secondary | ICD-10-CM | POA: Diagnosis not present

## 2022-11-29 DIAGNOSIS — M5136 Other intervertebral disc degeneration, lumbar region: Secondary | ICD-10-CM

## 2022-11-29 DIAGNOSIS — Z1159 Encounter for screening for other viral diseases: Secondary | ICD-10-CM

## 2022-11-29 DIAGNOSIS — K5790 Diverticulosis of intestine, part unspecified, without perforation or abscess without bleeding: Secondary | ICD-10-CM

## 2022-11-29 DIAGNOSIS — E785 Hyperlipidemia, unspecified: Secondary | ICD-10-CM

## 2022-11-29 DIAGNOSIS — I700 Atherosclerosis of aorta: Secondary | ICD-10-CM | POA: Diagnosis not present

## 2022-11-29 DIAGNOSIS — Z1329 Encounter for screening for other suspected endocrine disorder: Secondary | ICD-10-CM

## 2022-11-29 DIAGNOSIS — Z130 Encounter for screening for diseases of the blood and blood-forming organs and certain disorders involving the immune mechanism: Secondary | ICD-10-CM | POA: Diagnosis not present

## 2022-11-29 DIAGNOSIS — I152 Hypertension secondary to endocrine disorders: Secondary | ICD-10-CM | POA: Diagnosis not present

## 2022-11-29 DIAGNOSIS — Z13228 Encounter for screening for other metabolic disorders: Secondary | ICD-10-CM

## 2022-11-29 DIAGNOSIS — Z0001 Encounter for general adult medical examination with abnormal findings: Secondary | ICD-10-CM | POA: Diagnosis not present

## 2022-11-29 LAB — CBC WITH DIFFERENTIAL/PLATELET
Basophils Absolute: 0.1 10*3/uL (ref 0.0–0.1)
Basophils Relative: 0.9 % (ref 0.0–3.0)
Eosinophils Absolute: 0.3 10*3/uL (ref 0.0–0.7)
Eosinophils Relative: 3.2 % (ref 0.0–5.0)
HCT: 47.6 % (ref 39.0–52.0)
Hemoglobin: 15.7 g/dL (ref 13.0–17.0)
Lymphocytes Relative: 21.7 % (ref 12.0–46.0)
Lymphs Abs: 2 10*3/uL (ref 0.7–4.0)
MCHC: 33 g/dL (ref 30.0–36.0)
MCV: 90.8 fl (ref 78.0–100.0)
Monocytes Absolute: 0.5 10*3/uL (ref 0.1–1.0)
Monocytes Relative: 5.6 % (ref 3.0–12.0)
Neutro Abs: 6.2 10*3/uL (ref 1.4–7.7)
Neutrophils Relative %: 68.6 % (ref 43.0–77.0)
Platelets: 150 10*3/uL (ref 150.0–400.0)
RBC: 5.24 Mil/uL (ref 4.22–5.81)
RDW: 13.3 % (ref 11.5–15.5)
WBC: 9.1 10*3/uL (ref 4.0–10.5)

## 2022-11-29 LAB — PSA: PSA: 0.27 ng/mL (ref 0.10–4.00)

## 2022-11-29 LAB — LIPID PANEL
Cholesterol: 124 mg/dL (ref 0–200)
HDL: 45.6 mg/dL (ref >39.00)
LDL Cholesterol: 60 mg/dL (ref 0–99)
NonHDL: 78.06
Total CHOL/HDL Ratio: 3
Triglycerides: 92 mg/dL (ref 0.0–149.0)
VLDL: 18.4 mg/dL (ref 0.0–40.0)

## 2022-11-29 LAB — MICROALBUMIN / CREATININE URINE RATIO
Creatinine,U: 127.4 mg/dL
Microalb Creat Ratio: 0.5 mg/g (ref 0.0–30.0)
Microalb, Ur: <0.7 mg/dL (ref 0.0–1.9)

## 2022-11-29 LAB — COMPREHENSIVE METABOLIC PANEL
ALT: 31 U/L (ref 0–53)
AST: 19 U/L (ref 0–37)
Albumin: 3.8 g/dL (ref 3.5–5.2)
Alkaline Phosphatase: 76 U/L (ref 39–117)
BUN: 16 mg/dL (ref 6–23)
CO2: 30 meq/L (ref 19–32)
Calcium: 8.4 mg/dL (ref 8.4–10.5)
Chloride: 102 meq/L (ref 96–112)
Creatinine, Ser: 0.87 mg/dL (ref 0.40–1.50)
GFR: 89.18 mL/min (ref >60.00)
Glucose, Bld: 95 mg/dL (ref 70–99)
Potassium: 4.1 meq/L (ref 3.5–5.1)
Sodium: 138 meq/L (ref 135–145)
Total Bilirubin: 0.7 mg/dL (ref 0.2–1.2)
Total Protein: 6.4 g/dL (ref 6.0–8.3)

## 2022-11-29 LAB — HEMOGLOBIN A1C: Hgb A1c MFr Bld: 7.3 % — ABNORMAL HIGH (ref 4.6–6.5)

## 2022-11-29 NOTE — Assessment & Plan Note (Signed)
Diet and nutrition discussed Continues atorvastatin 20 mg daily

## 2022-11-29 NOTE — Assessment & Plan Note (Addendum)
Elevated blood pressure reading in the office but normal at home. Continue amlodipine 5 mg daily and losartan 100 mg daily Well-controlled diabetes with hemoglobin A1c at 7.0 just 1 week ago when he went to see endocrinologist Continues Tresiba 50 units daily along with metformin and glipizide and weekly Ozempic 2 mg Cardiovascular risks associated with hypertension and diabetes discussed Diet and nutrition discussed

## 2022-11-29 NOTE — Progress Notes (Signed)
James Garcia 67 y.o.   Chief Complaint  Patient presents with   Annual Exam    Patient states the Linzess 145 mcg was causing him to have diarrhea.     HISTORY OF PRESENT ILLNESS: This is a 67 y.o. male here for annual exam and follow-up of multiple chronic medical conditions Overall doing well.  Has no complaints or medical concerns today except diarrhea from Linzess 145 mg  HPI   Prior to Admission medications   Medication Sig Start Date End Date Taking? Authorizing Provider  amLODipine (NORVASC) 5 MG tablet Take 1 tablet (5 mg total) by mouth daily. 10/11/22  Yes Caddie Randle, Eilleen Kempf, MD  atorvastatin (LIPITOR) 20 MG tablet Take 1 tablet (20 mg total) by mouth daily. 10/11/22  Yes Tailynn Armetta, Eilleen Kempf, MD  Blood Glucose Monitoring Suppl (FREESTYLE LITE) w/Device KIT Use to test blood sugar daily 03/23/21  Yes Romero Belling, MD  Blood Glucose Monitoring Suppl (ONETOUCH VERIO FLEX SYSTEM) w/Device KIT Use as advised 07/13/22  Yes Carlus Pavlov, MD  cetirizine (ZYRTEC) 10 MG tablet Take 10 mg by mouth as needed. 08/30/19  Yes [provider]  fluticasone (FLONASE) 50 MCG/ACT nasal spray Place 1-2 sprays into both nostrils daily. 09/16/21  Yes Raspet, Erin K, PA-C  gabapentin (NEURONTIN) 100 MG capsule Take 1 capsule (100 mg total) by mouth 2 (two) times daily. 11/23/22  Yes Numa Heatwole, Eilleen Kempf, MD  glipiZIDE (GLUCOTROL) 5 MG tablet Take 1 tablet (5 mg total) by mouth daily before supper. 07/13/22  Yes Carlus Pavlov, MD  glucose blood (ONETOUCH VERIO) test strip Use as instructed 3 times a day 07/13/22  Yes Carlus Pavlov, MD  insulin degludec (TRESIBA FLEXTOUCH) 200 UNIT/ML FlexTouch Pen Inject 50 Units into the skin daily. 10/28/22  Yes Carlus Pavlov, MD  Insulin Pen Needle (TECHLITE PLUS PEN NEEDLES) 32G X 4 MM MISC Use once daily to administer insulin. 11/23/22  Yes Carlus Pavlov, MD  Lancets (FREESTYLE) lancets USE TO CHECK BLOOD SUGAR 2 TIMES PER DAY. 12/18/20   Yes Cloyce Paterson, Eilleen Kempf, MD  losartan (COZAAR) 100 MG tablet Take 1 tablet (100 mg total) by mouth daily. 11/30/17  Yes Shade Flood, MD  metFORMIN (GLUCOPHAGE-XR) 500 MG 24 hr tablet Take 2 tablets (1,000 mg total) by mouth 2 (two) times daily with a meal. 07/27/22  Yes Carlus Pavlov, MD  methylPREDNISolone (MEDROL DOSEPAK) 4 MG TBPK tablet Take as directed 06/30/22  Yes Jamie Hafford, Eilleen Kempf, MD  neomycin-bacitracin-polymyxin (NEOSPORIN) ointment Apply 1 application topically every 12 (twelve) hours. 09/20/19  Yes Myra Rude, MD  omeprazole (PRILOSEC) 20 MG capsule Take 1 capsule (20 mg total) by mouth every morning. 10/03/22  Yes Jerzey Komperda, Eilleen Kempf, MD  OneTouch Delica Lancets 33G MISC Use 3x a day 07/13/22  Yes Carlus Pavlov, MD  pramipexole (MIRAPEX) 0.5 MG tablet Take 1 tablet (0.5 mg total) by mouth 3 (three) times daily. 11/28/19  Yes Sater, Pearletha Furl, MD  Semaglutide, 2 MG/DOSE, (OZEMPIC, 2 MG/DOSE,) 8 MG/3ML SOPN Inject 2 mg into the skin once a week. 05/03/22  Yes Carlus Pavlov, MD  Blood Pressure Monitor KIT Use as instructed for HTN Patient not taking: Reported on 11/29/2022 07/12/17   Shade Flood, MD  albuterol (PROVENTIL HFA;VENTOLIN HFA) 108 (90 Base) MCG/ACT inhaler Inhale 1-2 puffs into the lungs every 4 (four) hours as needed for wheezing or shortness of breath. 12/24/17 08/29/19  Shade Flood, MD    Allergies  Allergen Reactions   Insulin Glargine  Hives    specifically Semglee    Patient Active Problem List   Diagnosis Date Noted   Type 2 diabetes mellitus with diabetic mononeuropathy, with long-term current use of insulin (HCC) 07/13/2022   Acute lumbar myofascial strain 06/30/2022   Degenerative disc disease, lumbar 06/30/2022   Paraumbilical hernia 12/01/2020   Calculus of gallbladder without cholecystitis without obstruction 12/01/2020   Diverticulosis 12/01/2020   Atherosclerosis of aorta (HCC) 12/01/2020   Lumbar pain 12/01/2020    Hypertension associated with diabetes (HCC) 11/10/2020   Restless leg 11/28/2019   Dyslipidemia associated with type 2 diabetes mellitus (HCC) 05/01/2015    Past Medical History:  Diagnosis Date   Allergy    "IN MORNING SNEEZE   Bronchitis    CHRONIC   Constipation    uses OTC laxatives - hard stools every morning- small amount and feels like doesnt empty    Diabetes mellitus without complication (HCC)    DM type 2 (diabetes mellitus, type 2) (HCC)    GERD (gastroesophageal reflux disease)    Hyperlipidemia    Hypertension    Leg cramps    Tuberculin skin test (TST) positive    Tuberculosis    pt states was treated for TB    Past Surgical History:  Procedure Laterality Date   arm surgery     COLONOSCOPY     ELBOW SURGERY Right    FOOT SURGERY Bilateral    POLYPECTOMY      Social History   Socioeconomic History   Marital status: Married    Spouse name: Not on file   Number of children: 2   Years of education: Not on file   Highest education level: Not on file  Occupational History   Occupation: Floor Tech at [employer]   Tobacco Use   Smoking status: Former    Current packs/day: 0.00    Average packs/day: 0.3 packs/day for 5.0 years (1.3 ttl pk-yrs)    Types: Cigarettes    Start date: 06/03/1973    Quit date: 06/04/1978    Years since quitting: 44.5    Passive exposure: Never   Smokeless tobacco: Never  Vaping Use   Vaping status: Never Used  Substance and Sexual Activity   Alcohol use: No   Drug use: No   Sexual activity: Not on file  Other Topics Concern   Not on file  Social History Narrative   Lives with family       Social Determinants of Health   Financial Resource Strain: Not on file  Food Insecurity: Not on file  Transportation Needs: Not on file  Physical Activity: Not on file  Stress: Not on file  Social Connections: Not on file  Intimate Partner Violence: Not on file    Family History  Problem Relation Age of Onset   Diabetes Father     Colon cancer Neg Hx    Colon polyps Neg Hx    Rectal cancer Neg Hx    Stomach cancer Neg Hx    Crohn's disease Neg Hx    Esophageal cancer Neg Hx    Ulcerative colitis Neg Hx      Review of Systems  Constitutional: Negative.  Negative for chills and fever.  HENT: Negative.  Negative for congestion and sore throat.   Respiratory: Negative.  Negative for cough and shortness of breath.   Cardiovascular: Negative.  Negative for chest pain and palpitations.  Gastrointestinal:  Positive for constipation and diarrhea. Negative for abdominal pain, nausea and vomiting.  Genitourinary: Negative.  Negative for dysuria and hematuria.  Musculoskeletal: Negative.   Skin: Negative.  Negative for rash.  Neurological: Negative.  Negative for dizziness and headaches.  All other systems reviewed and are negative.   Vitals:   11/29/22 1355  BP: (!) 140/82  Pulse: 60  Temp: 98.1 F (36.7 C)  SpO2: 98%    Physical Exam Vitals reviewed.  Constitutional:      Appearance: Normal appearance. He is obese.  HENT:     Head: Normocephalic.     Right Ear: Tympanic membrane, ear canal and external ear normal.     Left Ear: Tympanic membrane, ear canal and external ear normal.     Mouth/Throat:     Mouth: Mucous membranes are moist.     Pharynx: Oropharynx is clear.  Eyes:     Extraocular Movements: Extraocular movements intact.     Conjunctiva/sclera: Conjunctivae normal.     Pupils: Pupils are equal, round, and reactive to light.  Cardiovascular:     Rate and Rhythm: Normal rate and regular rhythm.     Pulses: Normal pulses.     Heart sounds: Normal heart sounds.  Pulmonary:     Effort: Pulmonary effort is normal.     Breath sounds: Normal breath sounds.  Abdominal:     Palpations: Abdomen is soft.     Tenderness: There is no abdominal tenderness.  Musculoskeletal:     Cervical back: No tenderness.  Lymphadenopathy:     Cervical: No cervical adenopathy.  Skin:    General: Skin is warm  and dry.     Capillary Refill: Capillary refill takes less than 2 seconds.  Neurological:     General: No focal deficit present.     Mental Status: He is alert and oriented to person, place, and time.  Psychiatric:        Mood and Affect: Mood normal.        Behavior: Behavior normal.      ASSESSMENT & PLAN: Problem List Items Addressed This Visit       Cardiovascular and Mediastinum   Hypertension associated with diabetes (HCC)    Elevated blood pressure reading in the office but normal at home. Continue amlodipine 5 mg daily and losartan 100 mg daily Well-controlled diabetes with hemoglobin A1c at 7.0 just 1 week ago when he went to see endocrinologist Continues Tresiba 50 units daily along with metformin and glipizide and weekly Ozempic 2 mg Cardiovascular risks associated with hypertension and diabetes discussed Diet and nutrition discussed      Relevant Orders   CBC with Differential   Comprehensive metabolic panel   Hemoglobin A1c   Urine Microalbumin w/creat. ratio   Atherosclerosis of aorta (HCC)    Diet and nutrition discussed Continues atorvastatin 20 mg daily        Digestive   Diverticulosis    Chronic GI symptoms Advised to stay well-hydrated and increase amount of fiber in his diet        Endocrine   Dyslipidemia associated with type 2 diabetes mellitus (HCC)    Chronic stable conditions Diet and nutrition discussed Continues atorvastatin 20 mg daily      Relevant Orders   Comprehensive metabolic panel   Hemoglobin A1c   Lipid panel   Urine Microalbumin w/creat. ratio     Musculoskeletal and Integument   Degenerative disc disease, lumbar    Stable well-controlled condition No chronic pain      Other Visit Diagnoses     Encounter  for general adult medical examination with abnormal findings    -  Primary   Relevant Orders   CBC with Differential   Comprehensive metabolic panel   Hemoglobin A1c   Lipid panel   PSA(Must document that  pt has been informed of limitations of PSA testing.)   Hepatitis C antibody screen   Screening for prostate cancer       Relevant Orders   PSA(Must document that pt has been informed of limitations of PSA testing.)   Need for hepatitis C screening test       Relevant Orders   Hepatitis C antibody screen   Screening for deficiency anemia       Relevant Orders   CBC with Differential   Screening for endocrine, metabolic and immunity disorder       Relevant Orders   Comprehensive metabolic panel   Need for vaccination       Relevant Orders   Flu Vaccine Trivalent High Dose (Fluad) (Completed)   Tdap vaccine greater than or equal to 7yo IM (Completed)   Pneumococcal conjugate vaccine 20-valent (Prevnar 20) (Completed)      Modifiable risk factors discussed with patient. Anticipatory guidance according to age provided. The following topics were also discussed: Social Determinants of Health Smoking.  Non-smoker Diet and nutrition and need to decrease amount of daily carbohydrate intake and daily calories and increase amount of plant based protein in his diet Benefits of exercise Cancer screening and review of colonoscopy report from 2023 Vaccinations review and recommendations Cardiovascular risk assessment Review of multiple chronic medical conditions under management Review of all medications Mental health including depression and anxiety Fall and accident prevention  Patient Instructions  Health Maintenance, Male Adopting a healthy lifestyle and getting preventive care are important in promoting health and wellness. Ask your health care provider about: The right schedule for you to have regular tests and exams. Things you can do on your own to prevent diseases and keep yourself healthy. What should I know about diet, weight, and exercise? Eat a healthy diet  Eat a diet that includes plenty of vegetables, fruits, low-fat dairy products, and lean protein. Do not eat a lot of  foods that are high in solid fats, added sugars, or sodium. Maintain a healthy weight Body mass index (BMI) is a measurement that can be used to identify possible weight problems. It estimates body fat based on height and weight. Your health care provider can help determine your BMI and help you achieve or maintain a healthy weight. Get regular exercise Get regular exercise. This is one of the most important things you can do for your health. Most adults should: Exercise for at least 150 minutes each week. The exercise should increase your heart rate and make you sweat (moderate-intensity exercise). Do strengthening exercises at least twice a week. This is in addition to the moderate-intensity exercise. Spend less time sitting. Even light physical activity can be beneficial. Watch cholesterol and blood lipids Have your blood tested for lipids and cholesterol at 67 years of age, then have this test every 5 years. You may need to have your cholesterol levels checked more often if: Your lipid or cholesterol levels are high. You are older than 67 years of age. You are at high risk for heart disease. What should I know about cancer screening? Many types of cancers can be detected early and may often be prevented. Depending on your health history and family history, you may need to have cancer screening  at various ages. This may include screening for: Colorectal cancer. Prostate cancer. Skin cancer. Lung cancer. What should I know about heart disease, diabetes, and high blood pressure? Blood pressure and heart disease High blood pressure causes heart disease and increases the risk of stroke. This is more likely to develop in people who have high blood pressure readings or are overweight. Talk with your health care provider about your target blood pressure readings. Have your blood pressure checked: Every 3-5 years if you are 20-55 years of age. Every year if you are 12 years old or older. If you  are between the ages of 57 and 78 and are a current or former smoker, ask your health care provider if you should have a one-time screening for abdominal aortic aneurysm (AAA). Diabetes Have regular diabetes screenings. This checks your fasting blood sugar level. Have the screening done: Once every three years after age 58 if you are at a normal weight and have a low risk for diabetes. More often and at a younger age if you are overweight or have a high risk for diabetes. What should I know about preventing infection? Hepatitis B If you have a higher risk for hepatitis B, you should be screened for this virus. Talk with your health care provider to find out if you are at risk for hepatitis B infection. Hepatitis C Blood testing is recommended for: Everyone born from [birth year] through 1965. Anyone with known risk factors for hepatitis C. Sexually transmitted infections (STIs) You should be screened each year for STIs, including gonorrhea and chlamydia, if: You are sexually active and are younger than 66 years of age. You are older than 67 years of age and your health care provider tells you that you are at risk for this type of infection. Your sexual activity has changed since you were last screened, and you are at increased risk for chlamydia or gonorrhea. Ask your health care provider if you are at risk. Ask your health care provider about whether you are at high risk for HIV. Your health care provider may recommend a prescription medicine to help prevent HIV infection. If you choose to take medicine to prevent HIV, you should first get tested for HIV. You should then be tested every 3 months for as long as you are taking the medicine. Follow these instructions at home: Alcohol use Do not drink alcohol if your health care provider tells you not to drink. If you drink alcohol: Limit how much you have to 0-2 drinks a day. Know how much alcohol is in your drink. In the [place] one drink equals one 12  oz bottle of beer (355 mL), one 5 oz glass of wine (148 mL), or one 1 oz glass of hard liquor (44 mL). Lifestyle Do not use any products that contain nicotine or tobacco. These products include cigarettes, chewing tobacco, and vaping devices, such as e-cigarettes. If you need help quitting, ask your health care provider. Do not use street drugs. Do not share needles. Ask your health care provider for help if you need support or information about quitting drugs. General instructions Schedule regular health, dental, and eye exams. Stay current with your vaccines. Tell your health care provider if: You often feel depressed. You have ever been abused or do not feel safe at home. Summary Adopting a healthy lifestyle and getting preventive care are important in promoting health and wellness. Follow your health care provider's instructions about healthy diet, exercising, and getting tested or screened for  diseases. Follow your health care provider's instructions on monitoring your cholesterol and blood pressure. This information is not intended to replace advice given to you by your health care provider. Make sure you discuss any questions you have with your health care provider. Document Revised: 07/21/2020 Document Reviewed: 07/21/2020 Elsevier Patient Education  2024 Elsevier Inc.     Edwina Barth, MD [facility]

## 2022-11-29 NOTE — Assessment & Plan Note (Signed)
Chronic GI symptoms Advised to stay well-hydrated and increase amount of fiber in his diet

## 2022-11-29 NOTE — Assessment & Plan Note (Signed)
Stable well-controlled condition No chronic pain

## 2022-11-29 NOTE — Patient Instructions (Signed)
Health Maintenance, Male Adopting a healthy lifestyle and getting preventive care are important in promoting health and wellness. Ask your health care provider about: The right schedule for you to have regular tests and exams. Things you can do on your own to prevent diseases and keep yourself healthy. What should I know about diet, weight, and exercise? Eat a healthy diet  Eat a diet that includes plenty of vegetables, fruits, low-fat dairy products, and lean protein. Do not eat a lot of foods that are high in solid fats, added sugars, or sodium. Maintain a healthy weight Body mass index (BMI) is a measurement that can be used to identify possible weight problems. It estimates body fat based on height and weight. Your health care provider can help determine your BMI and help you achieve or maintain a healthy weight. Get regular exercise Get regular exercise. This is one of the most important things you can do for your health. Most adults should: Exercise for at least 150 minutes each week. The exercise should increase your heart rate and make you sweat (moderate-intensity exercise). Do strengthening exercises at least twice a week. This is in addition to the moderate-intensity exercise. Spend less time sitting. Even light physical activity can be beneficial. Watch cholesterol and blood lipids Have your blood tested for lipids and cholesterol at 67 years of age, then have this test every 5 years. You may need to have your cholesterol levels checked more often if: Your lipid or cholesterol levels are high. You are older than 67 years of age. You are at high risk for heart disease. What should I know about cancer screening? Many types of cancers can be detected early and may often be prevented. Depending on your health history and family history, you may need to have cancer screening at various ages. This may include screening for: Colorectal cancer. Prostate cancer. Skin cancer. Lung  cancer. What should I know about heart disease, diabetes, and high blood pressure? Blood pressure and heart disease High blood pressure causes heart disease and increases the risk of stroke. This is more likely to develop in people who have high blood pressure readings or are overweight. Talk with your health care provider about your target blood pressure readings. Have your blood pressure checked: Every 3-5 years if you are 7-89 years of age. Every year if you are 72 years old or older. If you are between the ages of 31 and 19 and are a current or former smoker, ask your health care provider if you should have a one-time screening for abdominal aortic aneurysm (AAA). Diabetes Have regular diabetes screenings. This checks your fasting blood sugar level. Have the screening done: Once every three years after age 90 if you are at a normal weight and have a low risk for diabetes. More often and at a younger age if you are overweight or have a high risk for diabetes. What should I know about preventing infection? Hepatitis B If you have a higher risk for hepatitis B, you should be screened for this virus. Talk with your health care provider to find out if you are at risk for hepatitis B infection. Hepatitis C Blood testing is recommended for: Everyone born from [birth year] through 1965. Anyone with known risk factors for hepatitis C. Sexually transmitted infections (STIs) You should be screened each year for STIs, including gonorrhea and chlamydia, if: You are sexually active and are younger than 67 years of age. You are older than 67 years of age and your  health care provider tells you that you are at risk for this type of infection. Your sexual activity has changed since you were last screened, and you are at increased risk for chlamydia or gonorrhea. Ask your health care provider if you are at risk. Ask your health care provider about whether you are at high risk for HIV. Your health care provider  may recommend a prescription medicine to help prevent HIV infection. If you choose to take medicine to prevent HIV, you should first get tested for HIV. You should then be tested every 3 months for as long as you are taking the medicine. Follow these instructions at home: Alcohol use Do not drink alcohol if your health care provider tells you not to drink. If you drink alcohol: Limit how much you have to 0-2 drinks a day. Know how much alcohol is in your drink. In the [place] one drink equals one 12 oz bottle of beer (355 mL), one 5 oz glass of wine (148 mL), or one 1 oz glass of hard liquor (44 mL). Lifestyle Do not use any products that contain nicotine or tobacco. These products include cigarettes, chewing tobacco, and vaping devices, such as e-cigarettes. If you need help quitting, ask your health care provider. Do not use street drugs. Do not share needles. Ask your health care provider for help if you need support or information about quitting drugs. General instructions Schedule regular health, dental, and eye exams. Stay current with your vaccines. Tell your health care provider if: You often feel depressed. You have ever been abused or do not feel safe at home. Summary Adopting a healthy lifestyle and getting preventive care are important in promoting health and wellness. Follow your health care provider's instructions about healthy diet, exercising, and getting tested or screened for diseases. Follow your health care provider's instructions on monitoring your cholesterol and blood pressure. This information is not intended to replace advice given to you by your health care provider. Make sure you discuss any questions you have with your health care provider. Document Revised: 07/21/2020 Document Reviewed: 07/21/2020 Elsevier Patient Education  2024 [employer].

## 2022-11-29 NOTE — Assessment & Plan Note (Signed)
Chronic stable conditions Diet and nutrition discussed Continues atorvastatin 20 mg daily

## 2022-11-30 LAB — HEPATITIS C ANTIBODY: Hepatitis C Ab: NONREACTIVE

## 2022-12-07 ENCOUNTER — Other Ambulatory Visit (HOSPITAL_COMMUNITY): Payer: Self-pay

## 2022-12-08 ENCOUNTER — Other Ambulatory Visit (HOSPITAL_COMMUNITY): Payer: Self-pay

## 2022-12-17 ENCOUNTER — Other Ambulatory Visit (HOSPITAL_COMMUNITY): Payer: Self-pay

## 2023-01-04 ENCOUNTER — Telehealth: Payer: Self-pay | Admitting: Emergency Medicine

## 2023-01-04 ENCOUNTER — Encounter: Payer: Self-pay | Admitting: Radiology

## 2023-01-04 NOTE — Telephone Encounter (Signed)
Patient needs you to email him confirmation that he had his flu shot here.  He needs to show it to his employer.  Email.  [email or web address]

## 2023-01-05 ENCOUNTER — Other Ambulatory Visit (HOSPITAL_COMMUNITY): Payer: Self-pay

## 2023-01-05 ENCOUNTER — Other Ambulatory Visit: Payer: Self-pay

## 2023-01-05 ENCOUNTER — Other Ambulatory Visit: Payer: Self-pay | Admitting: Internal Medicine

## 2023-01-05 ENCOUNTER — Other Ambulatory Visit: Payer: Self-pay | Admitting: Emergency Medicine

## 2023-01-05 DIAGNOSIS — I10 Essential (primary) hypertension: Secondary | ICD-10-CM

## 2023-01-05 DIAGNOSIS — E785 Hyperlipidemia, unspecified: Secondary | ICD-10-CM

## 2023-01-05 MED ORDER — TRESIBA FLEXTOUCH 200 UNIT/ML ~~LOC~~ SOPN
50.0000 [IU] | PEN_INJECTOR | Freq: Every day | SUBCUTANEOUS | 1 refills | Status: DC
  Filled 2023-01-05: qty 9, 36d supply, fill #0
  Filled 2023-02-14 (×2): qty 6, 24d supply, fill #1
  Filled 2023-03-14: qty 3, 12d supply, fill #2

## 2023-01-05 MED ORDER — ATORVASTATIN CALCIUM 20 MG PO TABS
20.0000 mg | ORAL_TABLET | Freq: Every day | ORAL | 3 refills | Status: DC
  Filled 2023-01-05: qty 90, 90d supply, fill #0
  Filled 2023-04-11: qty 90, 90d supply, fill #1
  Filled 2023-07-04: qty 90, 90d supply, fill #2
  Filled 2023-10-17: qty 90, 90d supply, fill #3

## 2023-01-05 MED ORDER — OMEPRAZOLE 20 MG PO CPDR
20.0000 mg | DELAYED_RELEASE_CAPSULE | Freq: Every morning | ORAL | 0 refills | Status: AC
  Filled 2023-01-05: qty 90, 90d supply, fill #0

## 2023-01-05 MED ORDER — AMLODIPINE BESYLATE 5 MG PO TABS
5.0000 mg | ORAL_TABLET | Freq: Every day | ORAL | 3 refills | Status: DC
  Filled 2023-01-05: qty 90, 90d supply, fill #0
  Filled 2023-04-11: qty 90, 90d supply, fill #1
  Filled 2023-07-04: qty 90, 90d supply, fill #2
  Filled 2023-10-17: qty 90, 90d supply, fill #3

## 2023-02-02 ENCOUNTER — Other Ambulatory Visit (HOSPITAL_COMMUNITY): Payer: Self-pay

## 2023-02-14 ENCOUNTER — Other Ambulatory Visit (HOSPITAL_COMMUNITY): Payer: Self-pay

## 2023-02-28 ENCOUNTER — Other Ambulatory Visit: Payer: Self-pay | Admitting: Emergency Medicine

## 2023-02-28 ENCOUNTER — Other Ambulatory Visit (HOSPITAL_COMMUNITY): Payer: Self-pay

## 2023-02-28 DIAGNOSIS — G6289 Other specified polyneuropathies: Secondary | ICD-10-CM

## 2023-02-28 MED ORDER — GABAPENTIN 100 MG PO CAPS
100.0000 mg | ORAL_CAPSULE | Freq: Two times a day (BID) | ORAL | 0 refills | Status: DC
Start: 2023-02-28 — End: 2023-03-29
  Filled 2023-02-28: qty 180, 90d supply, fill #0

## 2023-03-07 ENCOUNTER — Ambulatory Visit: Payer: Self-pay | Admitting: Emergency Medicine

## 2023-03-07 NOTE — Telephone Encounter (Signed)
Received phone call from [facility] stating patient was looking to schedule an appointment. When patient was being transferred over, patient hung up before we were connected.

## 2023-03-14 ENCOUNTER — Other Ambulatory Visit: Payer: Self-pay | Admitting: Internal Medicine

## 2023-03-14 ENCOUNTER — Other Ambulatory Visit (HOSPITAL_COMMUNITY): Payer: Self-pay

## 2023-03-14 MED ORDER — TRESIBA FLEXTOUCH 200 UNIT/ML ~~LOC~~ SOPN
50.0000 [IU] | PEN_INJECTOR | Freq: Every day | SUBCUTANEOUS | 3 refills | Status: DC
  Filled 2023-03-14: qty 15, 60d supply, fill #0
  Filled 2023-03-15: qty 6, 24d supply, fill #0
  Filled 2023-03-15: qty 9, 36d supply, fill #0
  Filled 2023-04-06: qty 6, 24d supply, fill #1
  Filled 2023-05-01: qty 6, 24d supply, fill #2
  Filled 2023-05-27: qty 6, 24d supply, fill #3
  Filled 2023-06-21: qty 6, 24d supply, fill #4
  Filled 2023-07-17: qty 6, 24d supply, fill #5

## 2023-03-15 ENCOUNTER — Other Ambulatory Visit: Payer: Self-pay

## 2023-03-15 ENCOUNTER — Other Ambulatory Visit (HOSPITAL_COMMUNITY): Payer: Self-pay

## 2023-03-15 MED ORDER — OMRON 3 SERIES BP MONITOR DEVI
0 refills | Status: AC
  Filled 2023-03-15: qty 1, 13d supply, fill #0

## 2023-03-21 ENCOUNTER — Encounter: Payer: Self-pay | Admitting: Internal Medicine

## 2023-03-21 ENCOUNTER — Other Ambulatory Visit (HOSPITAL_COMMUNITY): Payer: Self-pay

## 2023-03-21 ENCOUNTER — Ambulatory Visit (INDEPENDENT_AMBULATORY_CARE_PROVIDER_SITE_OTHER): Payer: 59 | Admitting: Internal Medicine

## 2023-03-21 VITALS — BP 138/76 | HR 83 | Ht 74.0 in | Wt 280.6 lb

## 2023-03-21 DIAGNOSIS — E1141 Type 2 diabetes mellitus with diabetic mononeuropathy: Secondary | ICD-10-CM

## 2023-03-21 DIAGNOSIS — Z794 Long term (current) use of insulin: Secondary | ICD-10-CM | POA: Diagnosis not present

## 2023-03-21 DIAGNOSIS — E7849 Other hyperlipidemia: Secondary | ICD-10-CM | POA: Diagnosis not present

## 2023-03-21 LAB — POCT GLYCOSYLATED HEMOGLOBIN (HGB A1C): Hemoglobin A1C: 7.6 % — AB (ref 4.0–5.6)

## 2023-03-21 MED ORDER — GLIPIZIDE 5 MG PO TABS
5.0000 mg | ORAL_TABLET | Freq: Every day | ORAL | 3 refills | Status: DC
  Filled 2023-03-21: qty 90, 90d supply, fill #0

## 2023-03-21 NOTE — Progress Notes (Signed)
 Patient ID: James Garcia, male   DOB: Jan 27, 1956, 68 y.o.   MRN: [id number]  HPI: James Garcia is a 68 y.o.-year-old male, returning for follow-up for DM2, dx in 2007, insulin -dependent since 2016, uncontrolled, with complications (diabetic retinopathy, peripheral neuropathy). Pt. previously saw Dr. Kassie, but last visit with me 4 mo ago.  Interim history: No increased urination, blurry vision, nausea, chest pain.  He continues to have muscle cramps at night. He works from 3 PM to 11 PM.  Before last visit, he cut out snacking at night but now eating this again. He continues to have a rash, may be slightly better lately.  Reviewed HbA1c: Lab Results  Component Value Date   HGBA1C 7.3 (H) 11/29/2022   HGBA1C 7.0 (A) 11/16/2022   HGBA1C 8.1 (A) 07/13/2022   HGBA1C 7.2 (A) 03/11/2022   HGBA1C 7.1 (A) 11/03/2021   HGBA1C 6.8 (A) 09/28/2021   HGBA1C 7.0 (A) 03/23/2021   HGBA1C 7.5 (A) 12/08/2020   HGBA1C 6.2 (A) 09/01/2020   HGBA1C 7.6 (A) 04/21/2020   Pt is on a regimen of: - Metformin  ER 1000 mg 2x a day, with meals - Glipizide  5 mg before a larger meal-started 06/2022 - at bedtime >> minutes before snacking at night >> ran out 1 mo ago - Semglee  55 units in am - long needles (8 mm) >> Tresiba  U200 50 units daily. - Ozempic  2 mg weekly He tried Farxiga  >> rash.  Pt checks his sugars 2x a day and they are: - am: 67, 72-145, 180, 217 >> 94-170, 218 >> 79-146, 179 >> 80-140 (on Glipizide ),77, 108-218 (off Glipizide ) - 2h after b'fast: n/c - before lunch: 156 - 2h after lunch: n/c - before dinner: n/c - 2h after dinner: n/c - bedtime: 107-187 >> 87, 100-207 >> 57, 88-188, 276 >> 110-120 (on Glipizide ), 111-200, 268, 287 (off Glipizide ) - nighttime: n/c Lowest sugar was 87 >> 57 >> 77; he has hypoglycemia awareness at 70s.  Highest sugar was 218 >> 276 >> 287.  Glucometer: Freestyle Lite  Pt's meals are: - Breakfast: fried eggs + toast and coffee - Lunch: skips - Dinner: 7-8  am: meat + veggies (cafeteria at [facility]) - bedtime snack: chicken or fried egg  - no CKD, last BUN/creatinine:  Lab Results  Component Value Date   BUN 16 11/29/2022   BUN 15 09/28/2021   CREATININE 0.87 11/29/2022   CREATININE 0.79 09/28/2021   Lab Results  Component Value Date   MICRALBCREAT 0.5 11/29/2022   MICRALBCREAT 1.1 09/28/2021   MICRALBCREAT 0.7 12/24/2016  On Cozaar  100 mg daily.  - + HL; last set of lipids: Lab Results  Component Value Date   CHOL 124 11/29/2022   HDL 45.60 11/29/2022   LDLCALC 60 11/29/2022   TRIG 92.0 11/29/2022   CHOLHDL 3 11/29/2022  On Lipitor 20 mg daily.  - last eye exam was in 07/2021. + DR reportedly.   - + numbness and tingling in his feet.  Last foot exam 03/11/2022. On Neurontin  200 mg 2x a day. He did not try the ALA. he did not establish care with podiatry as recommended.  He has a history of HTN, constipation, history of gallbladder stone, history of TB.  ROS: + see HPI  Past Medical History:  Diagnosis Date   Allergy     IN MORNING SNEEZE   Bronchitis    CHRONIC   Constipation    uses OTC laxatives - hard stools every morning- small amount  and feels like doesnt empty    Diabetes mellitus without complication (HCC)    DM type 2 (diabetes mellitus, type 2) (HCC)    GERD (gastroesophageal reflux disease)    Hyperlipidemia    Hypertension    Leg cramps    Tuberculin skin test (TST) positive    Tuberculosis    pt states was treated for TB   Past Surgical History:  Procedure Laterality Date   arm surgery     COLONOSCOPY     ELBOW SURGERY Right    FOOT SURGERY Bilateral    POLYPECTOMY     Social History   Socioeconomic History   Marital status: Married    Spouse name: Not on file   Number of children: 2   Years of education: Not on file   Highest education level: Not on file  Occupational History   Occupation: Floor Tech at [employer]   Tobacco Use   Smoking status: Former    Current packs/day:  0.00    Average packs/day: 0.3 packs/day for 5.0 years (1.3 ttl pk-yrs)    Types: Cigarettes    Start date: 06/03/1973    Quit date: 06/04/1978    Years since quitting: 44.8    Passive exposure: Never   Smokeless tobacco: Never  Vaping Use   Vaping status: Never Used  Substance and Sexual Activity   Alcohol use: No   Drug use: No   Sexual activity: Not on file  Other Topics Concern   Not on file  Social History Narrative   Lives with family       Social Drivers of Health   Financial Resource Strain: Not on file  Food Insecurity: Not on file  Transportation Needs: Not on file  Physical Activity: Not on file  Stress: Not on file  Social Connections: Not on file  Intimate Partner Violence: Not on file   Current Outpatient Medications on File Prior to Visit  Medication Sig Dispense Refill   amLODipine  (NORVASC ) 5 MG tablet Take 1 tablet (5 mg total) by mouth daily. 90 tablet 3   atorvastatin  (LIPITOR) 20 MG tablet Take 1 tablet (20 mg total) by mouth daily. 90 tablet 3   Blood Glucose Monitoring Suppl (FREESTYLE LITE) w/Device KIT Use to test blood sugar daily 1 kit 0   Blood Glucose Monitoring Suppl (ONETOUCH VERIO FLEX SYSTEM) w/Device KIT Use as advised 1 kit 0   Blood Pressure Monitor KIT Use as instructed for HTN (Patient not taking: Reported on 11/29/2022) 1 each 0   Blood Pressure Monitoring (OMRON 3 SERIES BP MONITOR) DEVI Use as directed 1 each 0   cetirizine  (ZYRTEC ) 10 MG tablet Take 10 mg by mouth as needed.     fluticasone  (FLONASE ) 50 MCG/ACT nasal spray Place 1-2 sprays into both nostrils daily. 16 g 0   gabapentin  (NEURONTIN ) 100 MG capsule Take 1 capsule (100 mg total) by mouth 2 (two) times daily. 180 capsule 0   glipiZIDE  (GLUCOTROL ) 5 MG tablet Take 1 tablet (5 mg total) by mouth daily before supper. 90 tablet 3   glucose blood (ONETOUCH VERIO) test strip Use as instructed 3 times a day 300 each 3   insulin  degludec (TRESIBA  FLEXTOUCH) 200 UNIT/ML FlexTouch Pen  Inject 50 Units into the skin daily. 18 mL 3   Insulin  Pen Needle (TECHLITE PLUS PEN NEEDLES) 32G X 4 MM MISC Use once daily to administer insulin . 100 each 3   Lancets (FREESTYLE) lancets USE TO CHECK BLOOD SUGAR 2 TIMES  PER DAY. 200 each 2   losartan  (COZAAR ) 100 MG tablet Take 1 tablet (100 mg total) by mouth daily. 90 tablet 0   metFORMIN  (GLUCOPHAGE -XR) 500 MG 24 hr tablet Take 2 tablets (1,000 mg total) by mouth 2 (two) times daily with a meal. 360 tablet 3   methylPREDNISolone  (MEDROL  DOSEPAK) 4 MG TBPK tablet Take as directed 21 tablet 1   neomycin -bacitracin -polymyxin (NEOSPORIN) ointment Apply 1 application topically every 12 (twelve) hours. 15 g 0   omeprazole  (PRILOSEC) 20 MG capsule Take 1 capsule (20 mg total) by mouth every morning. 90 capsule 0   OneTouch Delica Lancets 33G MISC Use 3x a day 300 each 3   pramipexole  (MIRAPEX ) 0.5 MG tablet Take 1 tablet (0.5 mg total) by mouth 3 (three) times daily. 30 tablet 5   Semaglutide , 2 MG/DOSE, (OZEMPIC , 2 MG/DOSE,) 8 MG/3ML SOPN Inject 2 mg into the skin once a week. 9 mL 3   [DISCONTINUED] albuterol  (PROVENTIL  HFA;VENTOLIN  HFA) 108 (90 Base) MCG/ACT inhaler Inhale 1-2 puffs into the lungs every 4 (four) hours as needed for wheezing or shortness of breath. 1 Inhaler 0   Current Facility-Administered Medications on File Prior to Visit  Medication Dose Route Frequency Provider Last Rate Last Admin   0.9 %  sodium chloride  infusion  500 mL Intravenous Once Pyrtle, Gordy HERO, MD       Allergies  Allergen Reactions   Insulin  Glargine Hives    specifically Semglee    Family History  Problem Relation Age of Onset   Diabetes Father    Colon cancer Neg Hx    Colon polyps Neg Hx    Rectal cancer Neg Hx    Stomach cancer Neg Hx    Crohn's disease Neg Hx    Esophageal cancer Neg Hx    Ulcerative colitis Neg Hx    PE: BP 138/76   Pulse 83   Ht 6' 2 (1.88 m)   Wt 280 lb 9.6 oz (127.3 kg)   SpO2 97%   BMI 36.03 kg/m  Wt Readings from  Last 3 Encounters:  03/21/23 280 lb 9.6 oz (127.3 kg)  11/29/22 278 lb 2 oz (126.2 kg)  11/16/22 276 lb 3.2 oz (125.3 kg)   Constitutional: overweight, in NAD Eyes: no exophthalmos ENT: no thyromegaly, no cervical lymphadenopathy Cardiovascular: RRR, No MRG, + mild B pitting periankle edema Respiratory: CTA B Musculoskeletal: + deformities - L elbow deformity after being dislocated and healing in the wrong position Skin:  no rashes Neurological: no tremor with outstretched hands  ASSESSMENT: 1. DM2, insulin -dependent, uncontrolled, with complications - DR - PN  2. HL  PLAN:  1. Patient with longstanding, uncontrolled, diabetes, on oral antidiabetic regimen with metformin , sulfonylurea, weekly GLP-1 receptor agonist and long-acting insulin , with improving control.  At last visit, HbA1c was 7.0%, lower.  He had another HbA1c few days later at his annual physical exam and this was slightly higher, at 7.3%. -At last visit, he was taking glipizide  bedtime and we discussed about moving this 20 minutes before a larger meal but otherwise, since sugars appears to be improved and mostly at goal, we did not change his regimen. -At today's visit, he mentions that sugars are worse after he ran out of his glipizide  ~a month ago.  He also reintroduced his snack at night.  In the past he was taking glipizide  before the snack.  Since he has no low blood sugars, will add back to glipizide  and continue the rest of the regimen.  We did discuss about letting me know if his rash exacerbated after starting back on the glipizide .  In that case, we can maybe try again on SGLT2 inhibitor.  Of note, he also had a rash with Farxiga  in the past. - I suggested to:  Patient Instructions  Please continue: - Metformin  ER 1000 mg 2x a day, with meals - Ozempic  2 mg weekly - Tresiba  U200 50 units daily  Restart: - Glipizide  5 mg 20 min before a larger meal  Try to establish care with podiatry ([facility]) -  Address: [address] [place], [place], [place] [address] Phone: [phone]  Please return in 4 months with your sugar log.   - we checked his HbA1c: 7.6% (higher) - advised to check sugars at different times of the day - 1x a day, rotating check times - advised for yearly eye exams >> he is UTD - I prev. recommended alpha lipoic acid 600 mg twice a day for neuropathy symptoms but he forgot and continues only on Neurontin  for now.  I again recommended to see podiatry due to the significant neuropathy.  At today's visit, especially as he continues to describe heaviness in his feet and muscle cramps, I again recommended podiatry.  Given telephone number and address for the [facility]. - return to clinic in 4 months  2. HL -Latest lipid panel is from 11/2022: All fractions at goal: Lab Results  Component Value Date   CHOL 124 11/29/2022   HDL 45.60 11/29/2022   LDLCALC 60 11/29/2022   TRIG 92.0 11/29/2022   CHOLHDL 3 11/29/2022  -He can use Lipitor 20 mg daily.  He has some muscle cramps but unclear if from the statin.  I recommended starting magnesium and multivitamins and staying well-hydrated.  Lela Fendt, MD PhD [facility]

## 2023-03-21 NOTE — Patient Instructions (Addendum)
 Please continue: - Metformin  ER 1000 mg 2x a day, with meals - Ozempic  2 mg weekly - Tresiba  U200 50 units daily  Restart: - Glipizide  5 mg 20 min before a larger meal  Try to establish care with podiatry ([facility]) - Address: [address] [place], [place], [place] [address] Phone: [phone]  Please return in 4 months with your sugar log.

## 2023-03-22 ENCOUNTER — Other Ambulatory Visit (HOSPITAL_COMMUNITY): Payer: Self-pay

## 2023-03-29 ENCOUNTER — Other Ambulatory Visit (HOSPITAL_COMMUNITY): Payer: Self-pay

## 2023-03-29 ENCOUNTER — Encounter: Payer: Self-pay | Admitting: Podiatry

## 2023-03-29 ENCOUNTER — Other Ambulatory Visit: Payer: Self-pay | Admitting: Internal Medicine

## 2023-03-29 ENCOUNTER — Ambulatory Visit (INDEPENDENT_AMBULATORY_CARE_PROVIDER_SITE_OTHER): Payer: 59 | Admitting: Podiatry

## 2023-03-29 DIAGNOSIS — E1142 Type 2 diabetes mellitus with diabetic polyneuropathy: Secondary | ICD-10-CM | POA: Diagnosis not present

## 2023-03-29 DIAGNOSIS — E1141 Type 2 diabetes mellitus with diabetic mononeuropathy: Secondary | ICD-10-CM

## 2023-03-29 DIAGNOSIS — G6289 Other specified polyneuropathies: Secondary | ICD-10-CM

## 2023-03-29 MED ORDER — GABAPENTIN 100 MG PO CAPS: 100.0000 mg | ORAL_CAPSULE | Freq: Two times a day (BID) | ORAL | Status: DC

## 2023-03-29 MED ORDER — GABAPENTIN 300 MG PO CAPS
300.0000 mg | ORAL_CAPSULE | Freq: Every day | ORAL | 1 refills | Status: DC
  Filled 2023-03-29: qty 90, 90d supply, fill #0

## 2023-03-29 MED ORDER — OZEMPIC (2 MG/DOSE) 8 MG/3ML ~~LOC~~ SOPN
2.0000 mg | PEN_INJECTOR | SUBCUTANEOUS | 3 refills | Status: DC
  Filled 2023-03-29: qty 9, 84d supply, fill #0
  Filled 2023-05-02 – 2023-07-04 (×3): qty 9, 84d supply, fill #1
  Filled 2023-09-27: qty 9, 84d supply, fill #2

## 2023-03-29 NOTE — Telephone Encounter (Signed)
 Ozempic refill request complete

## 2023-03-30 NOTE — Progress Notes (Signed)
  Subjective:  Patient ID: James Garcia, male    DOB: 09/09/1955,  MRN: [id number]  Chief Complaint  Patient presents with   Foot Pain    He reports that he has some numbness and tingling and  the lower ankle and feet have numbness and tingling    Discussed the use of AI scribe software for clinical note transcription with the patient, who gave verbal consent to proceed.  History of Present Illness   The patient, a long-standing diabetic of approximately 20 years, presents with complaints of numbness and tingling in the feet, which has been ongoing for about a year. He also reports experiencing severe cramping, particularly in the ankle and thigh. Despite these symptoms, the patient notes good circulation with strong pulses. The patient's most recent A1c was 7, but he reports it was previously as high as 11.  The patient is currently on gabapentin , taking 100mg  twice daily for the management of these symptoms, which are suggestive of diabetic neuropathy. However, he reports that the medication initially caused weakness. The patient also mentions suffering from Crohn's disease and inquires if the gabapentin  might help with this condition.  In addition to the neuropathic symptoms, the patient reports a sensation of needles pricking his toes, which he attributes to his toenails. However, he denies any pain specifically associated with the toenails.        Past Medical History - Diabetes for 20 years - Diabetic neuropathy  Medications - Gabapentin  100 mg twice a day Objective:    Physical Exam   CARDIOVASCULAR: Pulses very strong.       No images are attached to the encounter.    Results   LABS HbA1c: 11.0%      Assessment:   1. Diabetic polyneuropathy associated with type 2 diabetes mellitus (HCC)      Plan:  Patient was evaluated and treated and all questions answered.  Assessment and Plan    Diabetic Neuropathy Chronic numbness and tingling in the feet have  persisted for about a year, with diabetes present for 20 years. Previous A1c levels exceeded 11%, now improved to around 7%. Symptoms include severe cramps in the ankle and thigh, and a sensation of needles in the toes. Strong pulses confirm good circulation, ruling out vascular issues. The current gabapentin  dose of 100 mg twice daily is low. Increase gabapentin  by adding 300 mg at night to better manage symptoms. If symptoms persist, consider switching to Lyrica. There is no cure for neuropathy; focus remains on symptom management. Send a prescription for gabapentin  300 mg to the pharmacy and coordinate with the primary care doctor to adjust the dose safely. Schedule a follow-up in six months.  Diabetes Mellitus Diabetes has been present for 20 years, with a previous A1c over 11%, now reduced to around 7%. Continue the current diabetes management plan and regularly monitor blood glucose levels. Discuss any changes in symptoms or complications with the primary care doctor.  Follow-up Schedule a follow-up appointment in six months.          Return in about 6 months (around 09/26/2023) for follow up on neuropathy.

## 2023-04-06 ENCOUNTER — Other Ambulatory Visit (HOSPITAL_COMMUNITY): Payer: Self-pay

## 2023-04-06 ENCOUNTER — Other Ambulatory Visit: Payer: Self-pay

## 2023-04-11 ENCOUNTER — Other Ambulatory Visit (HOSPITAL_COMMUNITY): Payer: Self-pay

## 2023-05-02 ENCOUNTER — Other Ambulatory Visit (HOSPITAL_COMMUNITY): Payer: Self-pay

## 2023-05-05 ENCOUNTER — Telehealth: Payer: Self-pay | Admitting: Podiatry

## 2023-05-05 NOTE — Telephone Encounter (Signed)
 Note: will need interpreter Pt stated that pain medication (Gabapentin) that was prescribed is not working and want to know if he can get something different for the pain. (Noted: seen 03/29/23)

## 2023-05-27 ENCOUNTER — Other Ambulatory Visit (HOSPITAL_COMMUNITY): Payer: Self-pay

## 2023-05-30 ENCOUNTER — Ambulatory Visit: Payer: 59 | Admitting: Emergency Medicine

## 2023-05-30 ENCOUNTER — Encounter: Payer: Self-pay | Admitting: Emergency Medicine

## 2023-05-30 ENCOUNTER — Other Ambulatory Visit (HOSPITAL_COMMUNITY): Payer: Self-pay

## 2023-05-30 VITALS — BP 168/90 | HR 62 | Temp 97.8°F | Ht 73.0 in | Wt 279.0 lb

## 2023-05-30 DIAGNOSIS — E785 Hyperlipidemia, unspecified: Secondary | ICD-10-CM | POA: Diagnosis not present

## 2023-05-30 DIAGNOSIS — I152 Hypertension secondary to endocrine disorders: Secondary | ICD-10-CM

## 2023-05-30 DIAGNOSIS — E1159 Type 2 diabetes mellitus with other circulatory complications: Secondary | ICD-10-CM

## 2023-05-30 DIAGNOSIS — E1141 Type 2 diabetes mellitus with diabetic mononeuropathy: Secondary | ICD-10-CM | POA: Diagnosis not present

## 2023-05-30 DIAGNOSIS — Z794 Long term (current) use of insulin: Secondary | ICD-10-CM

## 2023-05-30 DIAGNOSIS — Z7985 Long-term (current) use of injectable non-insulin antidiabetic drugs: Secondary | ICD-10-CM | POA: Diagnosis not present

## 2023-05-30 DIAGNOSIS — I700 Atherosclerosis of aorta: Secondary | ICD-10-CM

## 2023-05-30 DIAGNOSIS — E1169 Type 2 diabetes mellitus with other specified complication: Secondary | ICD-10-CM | POA: Diagnosis not present

## 2023-05-30 DIAGNOSIS — E1142 Type 2 diabetes mellitus with diabetic polyneuropathy: Secondary | ICD-10-CM | POA: Insufficient documentation

## 2023-05-30 LAB — COMPREHENSIVE METABOLIC PANEL
ALT: 19 U/L (ref 0–53)
AST: 17 U/L (ref 0–37)
Albumin: 4 g/dL (ref 3.5–5.2)
Alkaline Phosphatase: 70 U/L (ref 39–117)
BUN: 17 mg/dL (ref 6–23)
CO2: 30 meq/L (ref 19–32)
Calcium: 9 mg/dL (ref 8.4–10.5)
Chloride: 103 meq/L (ref 96–112)
Creatinine, Ser: 0.87 mg/dL (ref 0.40–1.50)
GFR: 88.87 mL/min (ref >60.00)
Glucose, Bld: 73 mg/dL (ref 70–99)
Potassium: 4.5 meq/L (ref 3.5–5.1)
Sodium: 139 meq/L (ref 135–145)
Total Bilirubin: 0.7 mg/dL (ref 0.2–1.2)
Total Protein: 7 g/dL (ref 6.0–8.3)

## 2023-05-30 LAB — CBC WITH DIFFERENTIAL/PLATELET
Basophils Absolute: 0 10*3/uL (ref 0.0–0.1)
Basophils Relative: 0.6 % (ref 0.0–3.0)
Eosinophils Absolute: 0.2 10*3/uL (ref 0.0–0.7)
Eosinophils Relative: 3.2 % (ref 0.0–5.0)
HCT: 48.6 % (ref 39.0–52.0)
Hemoglobin: 16.2 g/dL (ref 13.0–17.0)
Lymphocytes Relative: 21.4 % (ref 12.0–46.0)
Lymphs Abs: 1.6 10*3/uL (ref 0.7–4.0)
MCHC: 33.2 g/dL (ref 30.0–36.0)
MCV: 90.6 fl (ref 78.0–100.0)
Monocytes Absolute: 0.5 10*3/uL (ref 0.1–1.0)
Monocytes Relative: 6.9 % (ref 3.0–12.0)
Neutro Abs: 4.9 10*3/uL (ref 1.4–7.7)
Neutrophils Relative %: 67.9 % (ref 43.0–77.0)
Platelets: 135 10*3/uL — ABNORMAL LOW (ref 150.0–400.0)
RBC: 5.37 Mil/uL (ref 4.22–5.81)
RDW: 13.5 % (ref 11.5–15.5)
WBC: 7.2 10*3/uL (ref 4.0–10.5)

## 2023-05-30 LAB — LIPID PANEL
Cholesterol: 117 mg/dL (ref 0–200)
HDL: 44.7 mg/dL (ref >39.00)
LDL Cholesterol: 60 mg/dL (ref 0–99)
NonHDL: 72.43
Total CHOL/HDL Ratio: 3
Triglycerides: 63 mg/dL (ref 0.0–149.0)
VLDL: 12.6 mg/dL (ref 0.0–40.0)

## 2023-05-30 MED ORDER — DULOXETINE HCL 30 MG PO CPEP
30.0000 mg | ORAL_CAPSULE | Freq: Every day | ORAL | 3 refills | Status: AC
  Filled 2023-05-30: qty 30, 30d supply, fill #0

## 2023-05-30 NOTE — Assessment & Plan Note (Signed)
 Gabapentin not working Recommend trial of duloxetine 30 mg daily

## 2023-05-30 NOTE — Assessment & Plan Note (Signed)
 Chronic persistent symptoms affecting quality of life Was evaluated by podiatrist Gabapentin not working Recommend trial of duloxetine 30 mg daily

## 2023-05-30 NOTE — Patient Instructions (Signed)
 Hypertension, Adult High blood pressure (hypertension) is when the force of blood pumping through the arteries is too strong. The arteries are the blood vessels that carry blood from the heart throughout the body. Hypertension forces the heart to work harder to pump blood and may cause arteries to become narrow or stiff. Untreated or uncontrolled hypertension can lead to a heart attack, heart failure, a stroke, kidney disease, and other problems. A blood pressure reading consists of a higher number over a lower number. Ideally, your blood pressure should be below 120/80. The first ("top") number is called the systolic pressure. It is a measure of the pressure in your arteries as your heart beats. The second ("bottom") number is called the diastolic pressure. It is a measure of the pressure in your arteries as the heart relaxes. What are the causes? The exact cause of this condition is not known. There are some conditions that result in high blood pressure. What increases the risk? Certain factors may make you more likely to develop high blood pressure. Some of these risk factors are under your control, including: Smoking. Not getting enough exercise or physical activity. Being overweight. Having too much fat, sugar, calories, or salt (sodium) in your diet. Drinking too much alcohol. Other risk factors include: Having a personal history of heart disease, diabetes, high cholesterol, or kidney disease. Stress. Having a family history of high blood pressure and high cholesterol. Having obstructive sleep apnea. Age. The risk increases with age. What are the signs or symptoms? High blood pressure may not cause symptoms. Very high blood pressure (hypertensive crisis) may cause: Headache. Fast or irregular heartbeats (palpitations). Shortness of breath. Nosebleed. Nausea and vomiting. Vision changes. Severe chest pain, dizziness, and seizures. How is this diagnosed? This condition is diagnosed by  measuring your blood pressure while you are seated, with your arm resting on a flat surface, your legs uncrossed, and your feet flat on the floor. The cuff of the blood pressure monitor will be placed directly against the skin of your upper arm at the level of your heart. Blood pressure should be measured at least twice using the same arm. Certain conditions can cause a difference in blood pressure between your right and left arms. If you have a high blood pressure reading during one visit or you have normal blood pressure with other risk factors, you may be asked to: Return on a different day to have your blood pressure checked again. Monitor your blood pressure at home for 1 week or longer. If you are diagnosed with hypertension, you may have other blood or imaging tests to help your health care provider understand your overall risk for other conditions. How is this treated? This condition is treated by making healthy lifestyle changes, such as eating healthy foods, exercising more, and reducing your alcohol intake. You may be referred for counseling on a healthy diet and physical activity. Your health care provider may prescribe medicine if lifestyle changes are not enough to get your blood pressure under control and if: Your systolic blood pressure is above 130. Your diastolic blood pressure is above 80. Your personal target blood pressure may vary depending on your medical conditions, your age, and other factors. Follow these instructions at home: Eating and drinking  Eat a diet that is high in fiber and potassium, and low in sodium, added sugar, and fat. An example of this eating plan is called the DASH diet. DASH stands for Dietary Approaches to Stop Hypertension. To eat this way: Eat  plenty of fresh fruits and vegetables. Try to fill one half of your plate at each meal with fruits and vegetables. Eat whole grains, such as whole-wheat pasta, brown rice, or whole-grain bread. Fill about one  fourth of your plate with whole grains. Eat or drink low-fat dairy products, such as skim milk or low-fat yogurt. Avoid fatty cuts of meat, processed or cured meats, and poultry with skin. Fill about one fourth of your plate with lean proteins, such as fish, chicken without skin, beans, eggs, or tofu. Avoid pre-made and processed foods. These tend to be higher in sodium, added sugar, and fat. Reduce your daily sodium intake. Many people with hypertension should eat less than 1,500 mg of sodium a day. Do not drink alcohol if: Your health care provider tells you not to drink. You are pregnant, may be pregnant, or are planning to become pregnant. If you drink alcohol: Limit how much you have to: 0-1 drink a day for women. 0-2 drinks a day for men. Know how much alcohol is in your drink. In the [place] one drink equals one 12 oz bottle of beer (355 mL), one 5 oz glass of wine (148 mL), or one 1 oz glass of hard liquor (44 mL). Lifestyle  Work with your health care provider to maintain a healthy body weight or to lose weight. Ask what an ideal weight is for you. Get at least 30 minutes of exercise that causes your heart to beat faster (aerobic exercise) most days of the week. Activities may include walking, swimming, or biking. Include exercise to strengthen your muscles (resistance exercise), such as Pilates or lifting weights, as part of your weekly exercise routine. Try to do these types of exercises for 30 minutes at least 3 days a week. Do not use any products that contain nicotine or tobacco. These products include cigarettes, chewing tobacco, and vaping devices, such as e-cigarettes. If you need help quitting, ask your health care provider. Monitor your blood pressure at home as told by your health care provider. Keep all follow-up visits. This is important. Medicines Take over-the-counter and prescription medicines only as told by your health care provider. Follow directions carefully. Blood  pressure medicines must be taken as prescribed. Do not skip doses of blood pressure medicine. Doing this puts you at risk for problems and can make the medicine less effective. Ask your health care provider about side effects or reactions to medicines that you should watch for. Contact a health care provider if you: Think you are having a reaction to a medicine you are taking. Have headaches that keep coming back (recurring). Feel dizzy. Have swelling in your ankles. Have trouble with your vision. Get help right away if you: Develop a severe headache or confusion. Have unusual weakness or numbness. Feel faint. Have severe pain in your chest or abdomen. Vomit repeatedly. Have trouble breathing. These symptoms may be an emergency. Get help right away. Call 911. Do not wait to see if the symptoms will go away. Do not drive yourself to the hospital. Summary Hypertension is when the force of blood pumping through your arteries is too strong. If this condition is not controlled, it may put you at risk for serious complications. Your personal target blood pressure may vary depending on your medical conditions, your age, and other factors. For most people, a normal blood pressure is less than 120/80. Hypertension is treated with lifestyle changes, medicines, or a combination of both. Lifestyle changes include losing weight, eating a healthy,  low-sodium diet, exercising more, and limiting alcohol. This information is not intended to replace advice given to you by your health care provider. Make sure you discuss any questions you have with your health care provider. Document Revised: 01/06/2021 Document Reviewed: 01/06/2021 Elsevier Patient Education  2024 [employer].

## 2023-05-30 NOTE — Progress Notes (Signed)
 James Garcia 68 y.o.   Chief Complaint  Patient presents with   Follow-up    6 month f/u. Patient states he's been not feeling good,  c/o of his legs hurting and wanted to know if he can be written out of work for a couple of months so he can do all his doctor appts etc.  Patient wants to know if he can start Tadalafil something for weight loss.   Rash    Also c/o of rash on the lower left back and right abdominal that started 2-3 months ago. It has been on and off    HISTORY OF PRESENT ILLNESS: This is a 68 y.o. male here for 95-month follow-up of chronic medical conditions Insulin-dependent diabetic. Last office visit notes with endocrinologist last January as follows: ASSESSMENT: 1. DM2, insulin-dependent, uncontrolled, with complications - DR - PN   2. HL   PLAN:  1. Patient with longstanding, uncontrolled, diabetes, on oral antidiabetic regimen with metformin, sulfonylurea, weekly GLP-1 receptor agonist and long-acting insulin, with improving control.  At last visit, HbA1c was 7.0%, lower.  He had another HbA1c few days later at his annual physical exam and this was slightly higher, at 7.3%. -At last visit, he was taking glipizide bedtime and we discussed about moving this 20 minutes before a larger meal but otherwise, since sugars appears to be improved and mostly at goal, we did not change his regimen. -At today's visit, he mentions that sugars are worse after he ran out of his glipizide ~a month ago.  He also reintroduced his snack at night.  In the past he was taking glipizide before the snack.  Since he has no low blood sugars, will add back to glipizide and continue the rest of the regimen.  We did discuss about letting me know if his rash exacerbated after starting back on the glipizide.  In that case, we can maybe try again on SGLT2 inhibitor.  Of note, he also had a rash with Marcelline Deist in the past. - I suggested to:  Patient Instructions  Please continue: - Metformin ER 1000  mg 2x a day, with meals - Ozempic 2 mg weekly - Tresiba U200 50 units daily   Restart: - Glipizide 5 mg 20 min before a larger meal   Try to establish care with podiatry ([facility]) - Address: [address] [place], [place], [place] [address] Phone: [phone]   Please return in 4 months with your sugar log.   Rash Pertinent negatives include no congestion, cough, diarrhea, fever, shortness of breath, sore throat or vomiting.     Prior to Admission medications   Medication Sig Start Date End Date Taking? Authorizing Provider  amLODipine (NORVASC) 5 MG tablet Take 1 tablet (5 mg total) by mouth daily. 01/05/23  Yes Garet Hooton, Eilleen Kempf, MD  atorvastatin (LIPITOR) 20 MG tablet Take 1 tablet (20 mg total) by mouth daily. 01/05/23  Yes Igor Bishop, Eilleen Kempf, MD  Blood Glucose Monitoring Suppl (FREESTYLE LITE) w/Device KIT Use to test blood sugar daily 03/23/21  Yes Romero Belling, MD  Blood Glucose Monitoring Suppl [facility] VERIO FLEX SYSTEM) w/Device KIT Use as advised 07/13/22  Yes Carlus Pavlov, MD  Blood Pressure Monitor KIT Use as instructed for HTN 07/12/17  Yes Shade Flood, MD  Blood Pressure Monitoring (OMRON 3 SERIES BP MONITOR) DEVI Use as directed 03/15/23  Yes   cetirizine (ZYRTEC) 10 MG tablet Take 10 mg by mouth as needed. 08/30/19  Yes [provider]  fluticasone (FLONASE) 50 MCG/ACT nasal spray Place 1-2 sprays into both nostrils daily. 09/16/21  Yes Raspet, Erin K, PA-C  gabapentin (NEURONTIN) 100 MG capsule Take 1 capsule (100 mg total) by mouth 2 (two) times daily. One in the morning, and one mid day 03/29/23  Yes McDonald, Rachelle Hora, DPM  gabapentin (NEURONTIN) 300 MG capsule Take 1 capsule (300 mg total) by mouth at bedtime. 03/29/23  Yes McDonald, Adam R, DPM  glipiZIDE (GLUCOTROL) 5 MG tablet Take 1 tablet (5 mg total) by mouth daily before supper. 03/21/23  Yes Carlus Pavlov, MD  glucose blood (ONETOUCH VERIO) test strip Use as instructed 3 times a day  07/13/22  Yes Carlus Pavlov, MD  insulin degludec (TRESIBA FLEXTOUCH) 200 UNIT/ML FlexTouch Pen Inject 50 Units into the skin daily. 03/14/23  Yes Carlus Pavlov, MD  Insulin Pen Needle (TECHLITE PLUS PEN NEEDLES) 32G X 4 MM MISC Use once daily to administer insulin. 11/23/22  Yes Carlus Pavlov, MD  Lancets (FREESTYLE) lancets USE TO CHECK BLOOD SUGAR 2 TIMES PER DAY. 12/18/20  Yes Cruze Zingaro, Eilleen Kempf, MD  losartan (COZAAR) 100 MG tablet Take 1 tablet (100 mg total) by mouth daily. 11/30/17  Yes Shade Flood, MD  metFORMIN (GLUCOPHAGE-XR) 500 MG 24 hr tablet Take 2 tablets (1,000 mg total) by mouth 2 (two) times daily with a meal. 07/27/22  Yes Carlus Pavlov, MD  neomycin-bacitracin-polymyxin (NEOSPORIN) ointment Apply 1 application topically every 12 (twelve) hours. 09/20/19  Yes Myra Rude, MD  omeprazole (PRILOSEC) 20 MG capsule Take 1 capsule (20 mg total) by mouth every morning. 01/05/23  Yes Dove Gresham, Eilleen Kempf, MD  OneTouch Delica Lancets 33G MISC Use 3x a day 07/13/22  Yes Carlus Pavlov, MD  pramipexole (MIRAPEX) 0.5 MG tablet Take 1 tablet (0.5 mg total) by mouth 3 (three) times daily. 11/28/19  Yes Sater, Pearletha Furl, MD  Semaglutide, 2 MG/DOSE, (OZEMPIC, 2 MG/DOSE,) 8 MG/3ML SOPN Inject 2 mg into the skin once a week. 03/29/23  Yes Carlus Pavlov, MD  methylPREDNISolone (MEDROL DOSEPAK) 4 MG TBPK tablet Take as directed Patient not taking: Reported on 05/30/2023 06/30/22   Georgina Quint, MD  albuterol (PROVENTIL HFA;VENTOLIN HFA) 108 (90 Base) MCG/ACT inhaler Inhale 1-2 puffs into the lungs every 4 (four) hours as needed for wheezing or shortness of breath. 12/24/17 08/29/19  Shade Flood, MD    Allergies  Allergen Reactions   Insulin Glargine Hives    specifically Semglee    Patient Active Problem List   Diagnosis Date Noted   Type 2 diabetes mellitus with diabetic mononeuropathy, with long-term current use of insulin (HCC) 07/13/2022    Degenerative disc disease, lumbar 06/30/2022   Paraumbilical hernia 12/01/2020   Calculus of gallbladder without cholecystitis without obstruction 12/01/2020   Diverticulosis 12/01/2020   Atherosclerosis of aorta (HCC) 12/01/2020   Hypertension associated with diabetes (HCC) 11/10/2020   Restless leg 11/28/2019   Dyslipidemia associated with type 2 diabetes mellitus (HCC) 05/01/2015    Past Medical History:  Diagnosis Date   Allergy    "IN MORNING SNEEZE   Bronchitis    CHRONIC   Constipation    uses OTC laxatives - hard stools every morning- small amount and feels like doesnt empty    Diabetes mellitus without complication (HCC)    DM type 2 (diabetes mellitus, type 2) (HCC)    GERD (gastroesophageal reflux disease)    Hyperlipidemia    Hypertension    Leg cramps    Tuberculin skin test (  TST) positive    Tuberculosis    pt states was treated for TB    Past Surgical History:  Procedure Laterality Date   arm surgery     COLONOSCOPY     ELBOW SURGERY Right    FOOT SURGERY Bilateral    POLYPECTOMY      Social History   Socioeconomic History   Marital status: Married    Spouse name: Not on file   Number of children: 2   Years of education: Not on file   Highest education level: Not on file  Occupational History   Occupation: Floor Tech at [employer]   Tobacco Use   Smoking status: Former    Current packs/day: 0.00    Average packs/day: 0.3 packs/day for 5.0 years (1.3 ttl pk-yrs)    Types: Cigarettes    Start date: 06/03/1973    Quit date: 06/04/1978    Years since quitting: 45.0    Passive exposure: Never   Smokeless tobacco: Never  Vaping Use   Vaping status: Never Used  Substance and Sexual Activity   Alcohol use: No   Drug use: No   Sexual activity: Not on file  Other Topics Concern   Not on file  Social History Narrative   Lives with family       Social Drivers of Health   Financial Resource Strain: Not on file  Food Insecurity: Not on file   Transportation Needs: Not on file  Physical Activity: Not on file  Stress: Not on file  Social Connections: Not on file  Intimate Partner Violence: Not on file    Family History  Problem Relation Age of Onset   Diabetes Father    Colon cancer Neg Hx    Colon polyps Neg Hx    Rectal cancer Neg Hx    Stomach cancer Neg Hx    Crohn's disease Neg Hx    Esophageal cancer Neg Hx    Ulcerative colitis Neg Hx      Review of Systems  Constitutional: Negative.  Negative for chills and fever.  HENT: Negative.  Negative for congestion and sore throat.   Respiratory: Negative.  Negative for cough and shortness of breath.   Cardiovascular: Negative.  Negative for chest pain and palpitations.  Gastrointestinal:  Negative for abdominal pain, diarrhea, nausea and vomiting.  Genitourinary: Negative.  Negative for dysuria and hematuria.  Skin:  Positive for rash.  Neurological: Negative.  Negative for dizziness and headaches.  All other systems reviewed and are negative.   Vitals:   05/30/23 1401  BP: (!) 168/90  Pulse: 62  Temp: 97.8 F (36.6 C)  SpO2: 97%    Physical Exam Vitals reviewed.  Constitutional:      Appearance: Normal appearance. He is obese.  HENT:     Head: Normocephalic.     Mouth/Throat:     Mouth: Mucous membranes are moist.     Pharynx: Oropharynx is clear.  Eyes:     Extraocular Movements: Extraocular movements intact.  Cardiovascular:     Rate and Rhythm: Normal rate and regular rhythm.     Pulses: Normal pulses.     Heart sounds: Normal heart sounds.  Pulmonary:     Effort: Pulmonary effort is normal.     Breath sounds: Normal breath sounds.  Musculoskeletal:     Cervical back: No tenderness.     Right lower leg: No edema.     Left lower leg: No edema.  Lymphadenopathy:     Cervical:  No cervical adenopathy.  Skin:    Capillary Refill: Capillary refill takes less than 2 seconds.  Neurological:     General: No focal deficit present.     Mental  Status: He is alert and oriented to person, place, and time.  Psychiatric:        Mood and Affect: Mood normal.        Behavior: Behavior normal.      ASSESSMENT & PLAN: A total of 45 minutes was spent with the patient and counseling/coordination of care regarding preparing for this visit, review of most recent office visit notes, review of multiple chronic medical conditions and their management, review of all medications, review of most recent bloodwork results, review of health maintenance items, education on nutrition, prognosis, documentation, and need for follow up.   Problem List Items Addressed This Visit       Cardiovascular and Mediastinum   Hypertension associated with diabetes (HCC) - Primary   Elevated blood pressure reading in the office but normal at home. Continue amlodipine 5 mg daily and losartan 100 mg daily Well-controlled diabetes with hemoglobin A1c at 7.3 just 1 week ago when he went to see endocrinologist Continues Tresiba 50 units daily along with metformin and glipizide and weekly Ozempic 2 mg Cardiovascular risks associated with hypertension and diabetes discussed Diet and nutrition discussed      Relevant Orders   Comprehensive metabolic panel   CBC with Differential/Platelet   Lipid panel   Atherosclerosis of aorta (HCC)     Endocrine   Dyslipidemia associated with type 2 diabetes mellitus (HCC)   Chronic stable conditions Diet and nutrition discussed Continues atorvastatin 20 mg daily      Type 2 diabetes mellitus with diabetic mononeuropathy, with long-term current use of insulin (HCC)   Gabapentin not working Recommend trial of duloxetine 30 mg daily      Relevant Medications   DULoxetine (CYMBALTA) 30 MG capsule   Diabetic polyneuropathy associated with type 2 diabetes mellitus (HCC)   Chronic persistent symptoms affecting quality of life Was evaluated by podiatrist Gabapentin not working Recommend trial of duloxetine 30 mg daily       Relevant Medications   DULoxetine (CYMBALTA) 30 MG capsule   Patient Instructions  Hypertension, Adult High blood pressure (hypertension) is when the force of blood pumping through the arteries is too strong. The arteries are the blood vessels that carry blood from the heart throughout the body. Hypertension forces the heart to work harder to pump blood and may cause arteries to become narrow or stiff. Untreated or uncontrolled hypertension can lead to a heart attack, heart failure, a stroke, kidney disease, and other problems. A blood pressure reading consists of a higher number over a lower number. Ideally, your blood pressure should be below 120/80. The first ("top") number is called the systolic pressure. It is a measure of the pressure in your arteries as your heart beats. The second ("bottom") number is called the diastolic pressure. It is a measure of the pressure in your arteries as the heart relaxes. What are the causes? The exact cause of this condition is not known. There are some conditions that result in high blood pressure. What increases the risk? Certain factors may make you more likely to develop high blood pressure. Some of these risk factors are under your control, including: Smoking. Not getting enough exercise or physical activity. Being overweight. Having too much fat, sugar, calories, or salt (sodium) in your diet. Drinking too much alcohol. Other  risk factors include: Having a personal history of heart disease, diabetes, high cholesterol, or kidney disease. Stress. Having a family history of high blood pressure and high cholesterol. Having obstructive sleep apnea. Age. The risk increases with age. What are the signs or symptoms? High blood pressure may not cause symptoms. Very high blood pressure (hypertensive crisis) may cause: Headache. Fast or irregular heartbeats (palpitations). Shortness of breath. Nosebleed. Nausea and vomiting. Vision changes. Severe  chest pain, dizziness, and seizures. How is this diagnosed? This condition is diagnosed by measuring your blood pressure while you are seated, with your arm resting on a flat surface, your legs uncrossed, and your feet flat on the floor. The cuff of the blood pressure monitor will be placed directly against the skin of your upper arm at the level of your heart. Blood pressure should be measured at least twice using the same arm. Certain conditions can cause a difference in blood pressure between your right and left arms. If you have a high blood pressure reading during one visit or you have normal blood pressure with other risk factors, you may be asked to: Return on a different day to have your blood pressure checked again. Monitor your blood pressure at home for 1 week or longer. If you are diagnosed with hypertension, you may have other blood or imaging tests to help your health care provider understand your overall risk for other conditions. How is this treated? This condition is treated by making healthy lifestyle changes, such as eating healthy foods, exercising more, and reducing your alcohol intake. You may be referred for counseling on a healthy diet and physical activity. Your health care provider may prescribe medicine if lifestyle changes are not enough to get your blood pressure under control and if: Your systolic blood pressure is above 130. Your diastolic blood pressure is above 80. Your personal target blood pressure may vary depending on your medical conditions, your age, and other factors. Follow these instructions at home: Eating and drinking  Eat a diet that is high in fiber and potassium, and low in sodium, added sugar, and fat. An example of this eating plan is called the DASH diet. DASH stands for Dietary Approaches to Stop Hypertension. To eat this way: Eat plenty of fresh fruits and vegetables. Try to fill one half of your plate at each meal with fruits and vegetables. Eat  whole grains, such as whole-wheat pasta, brown rice, or whole-grain bread. Fill about one fourth of your plate with whole grains. Eat or drink low-fat dairy products, such as skim milk or low-fat yogurt. Avoid fatty cuts of meat, processed or cured meats, and poultry with skin. Fill about one fourth of your plate with lean proteins, such as fish, chicken without skin, beans, eggs, or tofu. Avoid pre-made and processed foods. These tend to be higher in sodium, added sugar, and fat. Reduce your daily sodium intake. Many people with hypertension should eat less than 1,500 mg of sodium a day. Do not drink alcohol if: Your health care provider tells you not to drink. You are pregnant, may be pregnant, or are planning to become pregnant. If you drink alcohol: Limit how much you have to: 0-1 drink a day for women. 0-2 drinks a day for men. Know how much alcohol is in your drink. In the [place] one drink equals one 12 oz bottle of beer (355 mL), one 5 oz glass of wine (148 mL), or one 1 oz glass of hard liquor (44 mL). Lifestyle  Work with your health care provider to maintain a healthy body weight or to lose weight. Ask what an ideal weight is for you. Get at least 30 minutes of exercise that causes your heart to beat faster (aerobic exercise) most days of the week. Activities may include walking, swimming, or biking. Include exercise to strengthen your muscles (resistance exercise), such as Pilates or lifting weights, as part of your weekly exercise routine. Try to do these types of exercises for 30 minutes at least 3 days a week. Do not use any products that contain nicotine or tobacco. These products include cigarettes, chewing tobacco, and vaping devices, such as e-cigarettes. If you need help quitting, ask your health care provider. Monitor your blood pressure at home as told by your health care provider. Keep all follow-up visits. This is important. Medicines Take over-the-counter and  prescription medicines only as told by your health care provider. Follow directions carefully. Blood pressure medicines must be taken as prescribed. Do not skip doses of blood pressure medicine. Doing this puts you at risk for problems and can make the medicine less effective. Ask your health care provider about side effects or reactions to medicines that you should watch for. Contact a health care provider if you: Think you are having a reaction to a medicine you are taking. Have headaches that keep coming back (recurring). Feel dizzy. Have swelling in your ankles. Have trouble with your vision. Get help right away if you: Develop a severe headache or confusion. Have unusual weakness or numbness. Feel faint. Have severe pain in your chest or abdomen. Vomit repeatedly. Have trouble breathing. These symptoms may be an emergency. Get help right away. Call 911. Do not wait to see if the symptoms will go away. Do not drive yourself to the hospital. Summary Hypertension is when the force of blood pumping through your arteries is too strong. If this condition is not controlled, it may put you at risk for serious complications. Your personal target blood pressure may vary depending on your medical conditions, your age, and other factors. For most people, a normal blood pressure is less than 120/80. Hypertension is treated with lifestyle changes, medicines, or a combination of both. Lifestyle changes include losing weight, eating a healthy, low-sodium diet, exercising more, and limiting alcohol. This information is not intended to replace advice given to you by your health care provider. Make sure you discuss any questions you have with your health care provider. Document Revised: 01/06/2021 Document Reviewed: 01/06/2021 Elsevier Patient Education  2024 Elsevier Inc.     Edwina Barth, MD [facility]

## 2023-05-30 NOTE — Assessment & Plan Note (Signed)
 Elevated blood pressure reading in the office but normal at home. Continue amlodipine 5 mg daily and losartan 100 mg daily Well-controlled diabetes with hemoglobin A1c at 7.3 just 1 week ago when he went to see endocrinologist Continues Tresiba 50 units daily along with metformin and glipizide and weekly Ozempic 2 mg Cardiovascular risks associated with hypertension and diabetes discussed Diet and nutrition discussed

## 2023-05-30 NOTE — Assessment & Plan Note (Signed)
Chronic stable conditions Diet and nutrition discussed Continues atorvastatin 20 mg daily

## 2023-05-31 ENCOUNTER — Encounter: Payer: Self-pay | Admitting: Emergency Medicine

## 2023-06-06 ENCOUNTER — Ambulatory Visit (HOSPITAL_COMMUNITY): Admission: EM | Admit: 2023-06-06 | Discharge: 2023-06-06 | Disposition: A | Discharge status: Home / Self Care

## 2023-06-06 ENCOUNTER — Encounter (HOSPITAL_COMMUNITY): Payer: Self-pay

## 2023-06-06 DIAGNOSIS — I160 Hypertensive urgency: Secondary | ICD-10-CM

## 2023-06-06 MED ORDER — CLONIDINE HCL 0.1 MG PO TABS
0.2000 mg | ORAL_TABLET | Freq: Once | ORAL | Status: AC
  Administered 2023-06-06: 0.2 mg via ORAL

## 2023-06-06 MED ORDER — CLONIDINE HCL 0.1 MG PO TABS
ORAL_TABLET | ORAL | Status: AC
  Filled 2023-06-06: qty 2

## 2023-06-06 NOTE — ED Provider Notes (Signed)
 [facility]  Note:  This document was prepared using Dragon voice recognition software and may include unintentional dictation errors.  MRN: [id number] DOB: March 18, 1955  Subjective:   James Garcia is a 68 y.o. male presenting for headache, shortness of breath, blurred vision x 2 days.  Patient reports that his blood pressure has been high for the last few days.  Patient has been taking blood pressure medication as directed.  Patient was seen by primary care provider last week and states that his blood pressure was high during his visit but no changes were made to the medications.  Patient also states that he is diabetic and states that his sugars have been running in the 80s and have been well-controlled on his medications.  Patient states sometimes early in the morning he does have a low blood sugar in the 50s but states that after breakfast his sugar goes back to normal range.  He states that he is mainly concerned with his symptoms regarding his blood pressure.   Current Facility-Administered Medications:    0.9 %  sodium chloride infusion, 500 mL, Intravenous, Once, Pyrtle, Carie Caddy, MD  Current Outpatient Medications:    amLODipine (NORVASC) 5 MG tablet, Take 1 tablet (5 mg total) by mouth daily., Disp: 90 tablet, Rfl: 3   atorvastatin (LIPITOR) 20 MG tablet, Take 1 tablet (20 mg total) by mouth daily., Disp: 90 tablet, Rfl: 3   Blood Glucose Monitoring Suppl (FREESTYLE LITE) w/Device KIT, Use to test blood sugar daily, Disp: 1 kit, Rfl: 0   Blood Glucose Monitoring Suppl (ONETOUCH VERIO FLEX SYSTEM) w/Device KIT, Use as advised, Disp: 1 kit, Rfl: 0   Blood Pressure Monitor KIT, Use as instructed for HTN, Disp: 1 each, Rfl: 0   Blood Pressure Monitoring (OMRON 3 SERIES BP MONITOR) DEVI, Use as directed, Disp: 1 each, Rfl: 0   cetirizine (ZYRTEC) 10 MG tablet, Take 10 mg by mouth as needed., Disp: , Rfl:    DULoxetine (CYMBALTA) 30 MG capsule, Take 1 capsule (30 mg total) by  mouth daily., Disp: 30 capsule, Rfl: 3   fluticasone (FLONASE) 50 MCG/ACT nasal spray, Place 1-2 sprays into both nostrils daily., Disp: 16 g, Rfl: 0   gabapentin (NEURONTIN) 300 MG capsule, Take 1 capsule (300 mg total) by mouth at bedtime., Disp: 90 capsule, Rfl: 1   glipiZIDE (GLUCOTROL) 5 MG tablet, Take 1 tablet (5 mg total) by mouth daily before supper., Disp: 90 tablet, Rfl: 3   glucose blood (ONETOUCH VERIO) test strip, Use as instructed 3 times a day, Disp: 300 each, Rfl: 3   insulin degludec (TRESIBA FLEXTOUCH) 200 UNIT/ML FlexTouch Pen, Inject 50 Units into the skin daily., Disp: 18 mL, Rfl: 3   Insulin Pen Needle (TECHLITE PLUS PEN NEEDLES) 32G X 4 MM MISC, Use once daily to administer insulin., Disp: 100 each, Rfl: 3   Lancets (FREESTYLE) lancets, USE TO CHECK BLOOD SUGAR 2 TIMES PER DAY., Disp: 200 each, Rfl: 2   losartan (COZAAR) 100 MG tablet, Take 1 tablet (100 mg total) by mouth daily., Disp: 90 tablet, Rfl: 0   metFORMIN (GLUCOPHAGE-XR) 500 MG 24 hr tablet, Take 2 tablets (1,000 mg total) by mouth 2 (two) times daily with a meal., Disp: 360 tablet, Rfl: 3   methylPREDNISolone (MEDROL DOSEPAK) 4 MG TBPK tablet, Take as directed (Patient not taking: Reported on 05/30/2023), Disp: 21 tablet, Rfl: 1   neomycin-bacitracin-polymyxin (NEOSPORIN) ointment, Apply 1 application topically every 12 (twelve) hours., Disp: 15 g, Rfl:  0   omeprazole (PRILOSEC) 20 MG capsule, Take 1 capsule (20 mg total) by mouth every morning., Disp: 90 capsule, Rfl: 0   OneTouch Delica Lancets 33G MISC, Use 3x a day, Disp: 300 each, Rfl: 3   pramipexole (MIRAPEX) 0.5 MG tablet, Take 1 tablet (0.5 mg total) by mouth 3 (three) times daily., Disp: 30 tablet, Rfl: 5   Semaglutide, 2 MG/DOSE, (OZEMPIC, 2 MG/DOSE,) 8 MG/3ML SOPN, Inject 2 mg into the skin once a week., Disp: 9 mL, Rfl: 3   Allergies  Allergen Reactions   Insulin Glargine Hives    specifically Semglee    Past Medical History:  Diagnosis Date    Allergy    "IN MORNING SNEEZE   Bronchitis    CHRONIC   Constipation    uses OTC laxatives - hard stools every morning- small amount and feels like doesnt empty    Diabetes mellitus without complication (HCC)    DM type 2 (diabetes mellitus, type 2) (HCC)    GERD (gastroesophageal reflux disease)    Hyperlipidemia    Hypertension    Leg cramps    Tuberculin skin test (TST) positive    Tuberculosis    pt states was treated for TB     Past Surgical History:  Procedure Laterality Date   arm surgery     COLONOSCOPY     ELBOW SURGERY Right    FOOT SURGERY Bilateral    POLYPECTOMY      Family History  Problem Relation Age of Onset   Diabetes Father    Colon cancer Neg Hx    Colon polyps Neg Hx    Rectal cancer Neg Hx    Stomach cancer Neg Hx    Crohn's disease Neg Hx    Esophageal cancer Neg Hx    Ulcerative colitis Neg Hx     Social History   Tobacco Use   Smoking status: Former    Current packs/day: 0.00    Average packs/day: 0.3 packs/day for 5.0 years (1.3 ttl pk-yrs)    Types: Cigarettes    Start date: 06/03/1973    Quit date: 06/04/1978    Years since quitting: 45.0    Passive exposure: Never   Smokeless tobacco: Never  Vaping Use   Vaping status: Never Used  Substance Use Topics   Alcohol use: No   Drug use: No    ROS Refer to HPI for ROS details.  Objective:   Vitals: BP (!) 168/93 (BP Location: Left Arm)   Pulse 66   Temp 98.2 F (36.8 C) (Oral)   Resp 18   SpO2 94%   Physical Exam Vitals and nursing note reviewed.  Constitutional:      General: He is not in acute distress.    Appearance: He is well-developed. He is not ill-appearing or toxic-appearing.  HENT:     Head: Normocephalic.  Eyes:     Conjunctiva/sclera: Conjunctivae normal.  Cardiovascular:     Rate and Rhythm: Normal rate and regular rhythm.     Heart sounds: No murmur heard. Pulmonary:     Effort: Pulmonary effort is normal. No respiratory distress.     Breath sounds:  Normal breath sounds. No decreased breath sounds, wheezing, rhonchi or rales.  Chest:     Chest wall: No tenderness.  Musculoskeletal:        General: No swelling.  Skin:    General: Skin is warm and dry.     Capillary Refill: Capillary refill takes less than 2  seconds.  Neurological:     General: No focal deficit present.     Mental Status: He is alert and oriented to person, place, and time.  Psychiatric:        Mood and Affect: Mood normal.        Behavior: Behavior normal.     Procedures  No results found for this or any previous visit (from the past 24 hours).  Assessment and Plan :   PDMP not reviewed this encounter.  1. Hypertensive urgency    1. Hypertensive urgency (Primary) - EKG 12-Lead performed in UC shows sinus rhythm with frequent PVCs, ventricular rate of 77 bpm, no STEMI, otherwise normal EKG. - cloNIDine (CATAPRES) tablet 0.2 mg given in UC for elevated blood pressure. -Repeat vitals performed in UC after blood pressure medication.  Blood pressure improved and patient reports headache is easing up. - Ambulatory Referral to Primary Care for follow-up evaluation and management of uncontrolled hypertension. -If you develop any advanced symptoms such as chest pain, shortness of breath, dizziness, blurred vision, severe headache, nausea and vomiting, abdominal pain, follow-up in ER immediately for further evaluation and management.  Lucky [employer]boy   Alpha, [place] B, [place] 06/06/23 [phone]

## 2023-06-06 NOTE — ED Triage Notes (Signed)
 Pt states when he got off work yesterday he had a bad headache and his b/p was high and states hard to take a deep breath at times. States also developed blurred vision last night. States complaint with DM and HTN meds. States saw his PCP a week ago and his b/p was high then.

## 2023-06-06 NOTE — Discharge Instructions (Addendum)
 1. Hypertensive urgency (Primary) - EKG 12-Lead performed in UC shows sinus rhythm with frequent PVCs, ventricular rate of 77 bpm, no STEMI, otherwise normal EKG. - cloNIDine (CATAPRES) tablet 0.2 mg given in UC for elevated blood pressure. -Repeat vitals performed in UC after blood pressure medication.  Blood pressure improved and patient reports headache is easing up. - Ambulatory Referral to Primary Care for follow-up evaluation and management of uncontrolled hypertension. -If you develop any advanced symptoms such as chest pain, shortness of breath, dizziness, blurred vision, severe headache, nausea and vomiting, abdominal pain, follow-up in ER immediately for further evaluation and management.

## 2023-06-07 ENCOUNTER — Encounter: Payer: Self-pay | Admitting: Emergency Medicine

## 2023-06-07 ENCOUNTER — Ambulatory Visit: Payer: Self-pay

## 2023-06-07 ENCOUNTER — Other Ambulatory Visit (HOSPITAL_COMMUNITY): Payer: Self-pay

## 2023-06-07 ENCOUNTER — Ambulatory Visit: Admitting: Emergency Medicine

## 2023-06-07 VITALS — BP 174/92 | HR 69 | Temp 98.3°F | Ht 73.0 in | Wt 280.0 lb

## 2023-06-07 DIAGNOSIS — E1159 Type 2 diabetes mellitus with other circulatory complications: Secondary | ICD-10-CM

## 2023-06-07 DIAGNOSIS — Z7985 Long-term (current) use of injectable non-insulin antidiabetic drugs: Secondary | ICD-10-CM | POA: Diagnosis not present

## 2023-06-07 DIAGNOSIS — Z7984 Long term (current) use of oral hypoglycemic drugs: Secondary | ICD-10-CM

## 2023-06-07 DIAGNOSIS — I700 Atherosclerosis of aorta: Secondary | ICD-10-CM | POA: Diagnosis not present

## 2023-06-07 DIAGNOSIS — I152 Hypertension secondary to endocrine disorders: Secondary | ICD-10-CM | POA: Diagnosis not present

## 2023-06-07 DIAGNOSIS — E785 Hyperlipidemia, unspecified: Secondary | ICD-10-CM

## 2023-06-07 DIAGNOSIS — E1169 Type 2 diabetes mellitus with other specified complication: Secondary | ICD-10-CM | POA: Diagnosis not present

## 2023-06-07 MED ORDER — VALSARTAN-HYDROCHLOROTHIAZIDE 320-12.5 MG PO TABS
1.0000 | ORAL_TABLET | Freq: Every day | ORAL | 3 refills | Status: AC
  Filled 2023-06-07: qty 90, 90d supply, fill #0
  Filled 2023-09-07: qty 90, 90d supply, fill #1
  Filled 2023-11-28: qty 90, 90d supply, fill #2
  Filled 2024-03-06: qty 90, 90d supply, fill #3

## 2023-06-07 NOTE — Progress Notes (Signed)
 James Garcia 68 y.o.   Chief Complaint  Patient presents with   Hypertension    Patient states he went to urgent care yesterday for bad headaches. States his bp was 163/93. Pt states the blurred vision is on and off. Has some dizziness off and on. Patient mentions in the morning when he checks his sugars its around the 50-60s and wanted to know if the numbers are that low does he still needs to take his insulin/ meds     HISTORY OF PRESENT ILLNESS: This is a 68 y.o. male complaining of elevated blood pressure readings at home Went to the emergency room yesterday with hypertensive urgency Assessment and plan as follows:  Assessment and Plan :    PDMP not reviewed this encounter.   1. Hypertensive urgency     1. Hypertensive urgency (Primary) - EKG 12-Lead performed in UC shows sinus rhythm with frequent PVCs, ventricular rate of 77 bpm, no STEMI, otherwise normal EKG. - cloNIDine (CATAPRES) tablet 0.2 mg given in UC for elevated blood pressure. -Repeat vitals performed in UC after blood pressure medication.  Blood pressure improved and patient reports headache is easing up. - Ambulatory Referral to Primary Care for follow-up evaluation and management of uncontrolled hypertension. -If you develop any advanced symptoms such as chest pain, shortness of breath, dizziness, blurred vision, severe headache, nausea and vomiting, abdominal pain, follow-up in ER immediately for further evaluation and management.   [place]   [place], [place] B, NP 06/06/23 1720    Hypertension Associated symptoms include headaches. Pertinent negatives include no chest pain, palpitations or shortness of breath.     Prior to Admission medications   Medication Sig Start Date End Date Taking? Authorizing Provider  amLODipine (NORVASC) 5 MG tablet Take 1 tablet (5 mg total) by mouth daily. 01/05/23  Yes Kale Rondeau, Eilleen Kempf, MD  atorvastatin (LIPITOR) 20 MG tablet Take 1 tablet (20 mg total)  by mouth daily. 01/05/23  Yes Rashaad Hallstrom, Eilleen Kempf, MD  Blood Glucose Monitoring Suppl (FREESTYLE LITE) w/Device KIT Use to test blood sugar daily 03/23/21  Yes Romero Belling, MD  Blood Glucose Monitoring Suppl [facility] VERIO FLEX SYSTEM) w/Device KIT Use as advised 07/13/22  Yes Carlus Pavlov, MD  Blood Pressure Monitor KIT Use as instructed for HTN 07/12/17  Yes Shade Flood, MD  Blood Pressure Monitoring (OMRON 3 SERIES BP MONITOR) DEVI Use as directed 03/15/23  Yes   cetirizine (ZYRTEC) 10 MG tablet Take 10 mg by mouth as needed. 08/30/19  Yes [provider]  DULoxetine (CYMBALTA) 30 MG capsule Take 1 capsule (30 mg total) by mouth daily. 05/30/23 06/29/23 Yes Asif Muchow, Eilleen Kempf, MD  fluticasone [facility]) 50 MCG/ACT nasal spray Place 1-2 sprays into both nostrils daily. 09/16/21  Yes Raspet, Erin K, PA-C  gabapentin (NEURONTIN) 300 MG capsule Take 1 capsule (300 mg total) by mouth at bedtime. 03/29/23  Yes McDonald, Adam R, DPM  glipiZIDE (GLUCOTROL) 5 MG tablet Take 1 tablet (5 mg total) by mouth daily before supper. 03/21/23  Yes Carlus Pavlov, MD  glucose blood (ONETOUCH VERIO) test strip Use as instructed 3 times a day 07/13/22  Yes Carlus Pavlov, MD  insulin degludec (TRESIBA FLEXTOUCH) 200 UNIT/ML FlexTouch Pen Inject 50 Units into the skin daily. 03/14/23  Yes Carlus Pavlov, MD  Insulin Pen Needle (TECHLITE PLUS PEN NEEDLES) 32G X 4 MM MISC Use once daily to administer insulin. 11/23/22  Yes Carlus Pavlov, MD  Lancets (FREESTYLE) lancets USE TO CHECK BLOOD  SUGAR 2 TIMES PER DAY. 12/18/20  Yes Jyra Lagares, Eilleen Kempf, MD  losartan (COZAAR) 100 MG tablet Take 1 tablet (100 mg total) by mouth daily. 11/30/17  Yes Shade Flood, MD  metFORMIN (GLUCOPHAGE-XR) 500 MG 24 hr tablet Take 2 tablets (1,000 mg total) by mouth 2 (two) times daily with a meal. 07/27/22  Yes Carlus Pavlov, MD  methylPREDNISolone (MEDROL DOSEPAK) 4 MG TBPK tablet Take as directed 06/30/22  Yes  Reese Stockman, Eilleen Kempf, MD  neomycin-bacitracin-polymyxin (NEOSPORIN) ointment Apply 1 application topically every 12 (twelve) hours. 09/20/19  Yes Myra Rude, MD  omeprazole (PRILOSEC) 20 MG capsule Take 1 capsule (20 mg total) by mouth every morning. 01/05/23  Yes Bubber Rothert, Eilleen Kempf, MD  OneTouch Delica Lancets 33G MISC Use 3x a day 07/13/22  Yes Carlus Pavlov, MD  pramipexole (MIRAPEX) 0.5 MG tablet Take 1 tablet (0.5 mg total) by mouth 3 (three) times daily. 11/28/19  Yes Sater, Pearletha Furl, MD  Semaglutide, 2 MG/DOSE, (OZEMPIC, 2 MG/DOSE,) 8 MG/3ML SOPN Inject 2 mg into the skin once a week. 03/29/23  Yes Carlus Pavlov, MD  albuterol (PROVENTIL HFA;VENTOLIN HFA) 108 (90 Base) MCG/ACT inhaler Inhale 1-2 puffs into the lungs every 4 (four) hours as needed for wheezing or shortness of breath. 12/24/17 08/29/19  Shade Flood, MD    Allergies  Allergen Reactions   Insulin Glargine Hives    specifically Semglee    Patient Active Problem List   Diagnosis Date Noted   Diabetic polyneuropathy associated with type 2 diabetes mellitus (HCC) 05/30/2023   Type 2 diabetes mellitus with diabetic mononeuropathy, with long-term current use of insulin (HCC) 07/13/2022   Degenerative disc disease, lumbar 06/30/2022   Paraumbilical hernia 12/01/2020   Calculus of gallbladder without cholecystitis without obstruction 12/01/2020   Diverticulosis 12/01/2020   Atherosclerosis of aorta (HCC) 12/01/2020   Hypertension associated with diabetes (HCC) 11/10/2020   Restless leg 11/28/2019   Dyslipidemia associated with type 2 diabetes mellitus (HCC) 05/01/2015    Past Medical History:  Diagnosis Date   Allergy    "IN MORNING SNEEZE   Bronchitis    CHRONIC   Constipation    uses OTC laxatives - hard stools every morning- small amount and feels like doesnt empty    Diabetes mellitus without complication (HCC)    DM type 2 (diabetes mellitus, type 2) (HCC)    GERD (gastroesophageal reflux  disease)    Hyperlipidemia    Hypertension    Leg cramps    Tuberculin skin test (TST) positive    Tuberculosis    pt states was treated for TB    Past Surgical History:  Procedure Laterality Date   arm surgery     COLONOSCOPY     ELBOW SURGERY Right    FOOT SURGERY Bilateral    POLYPECTOMY      Social History   Socioeconomic History   Marital status: Married    Spouse name: Not on file   Number of children: 2   Years of education: Not on file   Highest education level: Not on file  Occupational History   Occupation: Floor Tech at [employer]   Tobacco Use   Smoking status: Former    Current packs/day: 0.00    Average packs/day: 0.3 packs/day for 5.0 years (1.3 ttl pk-yrs)    Types: Cigarettes    Start date: 06/03/1973    Quit date: 06/04/1978    Years since quitting: 45.0    Passive exposure: Never   Smokeless  tobacco: Never  Vaping Use   Vaping status: Never Used  Substance and Sexual Activity   Alcohol use: No   Drug use: No   Sexual activity: Not on file  Other Topics Concern   Not on file  Social History Narrative   Lives with family       Social Drivers of Health   Financial Resource Strain: Not on file  Food Insecurity: Not on file  Transportation Needs: Not on file  Physical Activity: Not on file  Stress: Not on file  Social Connections: Not on file  Intimate Partner Violence: Not on file    Family History  Problem Relation Age of Onset   Diabetes Father    Colon cancer Neg Hx    Colon polyps Neg Hx    Rectal cancer Neg Hx    Stomach cancer Neg Hx    Crohn's disease Neg Hx    Esophageal cancer Neg Hx    Ulcerative colitis Neg Hx      Review of Systems  Constitutional: Negative.  Negative for chills and fever.  HENT: Negative.  Negative for congestion and sore throat.   Respiratory: Negative.  Negative for cough and shortness of breath.   Cardiovascular: Negative.  Negative for chest pain and palpitations.  Gastrointestinal:  Negative for  abdominal pain, diarrhea, nausea and vomiting.  Genitourinary: Negative.  Negative for dysuria and hematuria.  Skin: Negative.  Negative for rash.  Neurological:  Positive for headaches.  All other systems reviewed and are negative.   Vitals:   06/07/23 1438  BP: (!) 174/92  Pulse: 69  Temp: 98.3 F (36.8 C)  SpO2: 96%    Physical Exam Vitals reviewed.  Constitutional:      Appearance: Normal appearance.  HENT:     Head: Normocephalic.     Mouth/Throat:     Mouth: Mucous membranes are moist.     Pharynx: Oropharynx is clear.  Eyes:     Extraocular Movements: Extraocular movements intact.     Pupils: Pupils are equal, round, and reactive to light.  Cardiovascular:     Rate and Rhythm: Normal rate and regular rhythm.     Pulses: Normal pulses.     Heart sounds: Normal heart sounds.  Pulmonary:     Effort: Pulmonary effort is normal.     Breath sounds: Normal breath sounds.  Musculoskeletal:     Cervical back: No tenderness.  Lymphadenopathy:     Cervical: No cervical adenopathy.  Skin:    General: Skin is warm and dry.  Neurological:     General: No focal deficit present.     Mental Status: He is alert and oriented to person, place, and time.     Sensory: No sensory deficit.     Motor: No weakness.     Coordination: Coordination normal.     Gait: Gait normal.  Psychiatric:        Mood and Affect: Mood normal.        Behavior: Behavior normal.      ASSESSMENT & PLAN: A total of 46-minute was spent with the patient and counseling/coordination of care regarding preparing for this visit, review of most recent office visit notes, review of most recent emergency department visit notes, cardiovascular risks associated with uncontrolled hypertension, review of multiple chronic medical conditions and their management, review of all medications and changes made, review of most recent bloodwork results, review of health maintenance items, education on nutrition, prognosis,  documentation, and need for follow  up.   Problem List Items Addressed This Visit       Cardiovascular and Mediastinum   Hypertension associated with diabetes (HCC) - Primary   BP Readings from Last 3 Encounters:  06/07/23 (!) 174/92  06/06/23 (!) 168/93  05/30/23 (!) 168/90  Cardiovascular risks associated with uncontrolled hypertension discussed Recommend to continue Lidopin 5 mg daily, stop losartan, and start valsartan HCT 320-12.5 mg daily Diet and nutrition discussed Has been having low blood sugar readings in the morning Not taking glipizide Continues weekly Ozempic and daily metformin Presently taking Tresiba 50 units daily Recommend to decrease daily insulin by 5 units and monitor blood sugars at home.  May need to decrease it even more depending on readings. Diet and nutrition discussed        Relevant Medications   valsartan-hydrochlorothiazide (DIOVAN-HCT) 320-12.5 MG tablet   Atherosclerosis of aorta (HCC)   Diet and nutrition discussed Continues atorvastatin 20 mg daily      Relevant Medications   valsartan-hydrochlorothiazide (DIOVAN-HCT) 320-12.5 MG tablet     Endocrine   Dyslipidemia associated with type 2 diabetes mellitus (HCC)   Chronic stable conditions Diet and nutrition discussed Continues atorvastatin 20 mg daily      Relevant Medications   valsartan-hydrochlorothiazide (DIOVAN-HCT) 320-12.5 MG tablet   Patient Instructions  Stop losartan Start valsartan HCT 320-12.5 mg daily Continue amlodipine 5 mg daily Continue monitoring blood pressure readings at home daily for the next several weeks and keep a log.  Contact the office if numbers persistently abnormal Continue weekly Ozempic and daily metformin Decrease Tresiba insulin by 5 units and continue checking glucose levels especially in the morning.  Hypertension, Adult High blood pressure (hypertension) is when the force of blood pumping through the arteries is too strong. The arteries are  the blood vessels that carry blood from the heart throughout the body. Hypertension forces the heart to work harder to pump blood and may cause arteries to become narrow or stiff. Untreated or uncontrolled hypertension can lead to a heart attack, heart failure, a stroke, kidney disease, and other problems. A blood pressure reading consists of a higher number over a lower number. Ideally, your blood pressure should be below 120/80. The first ("top") number is called the systolic pressure. It is a measure of the pressure in your arteries as your heart beats. The second ("bottom") number is called the diastolic pressure. It is a measure of the pressure in your arteries as the heart relaxes. What are the causes? The exact cause of this condition is not known. There are some conditions that result in high blood pressure. What increases the risk? Certain factors may make you more likely to develop high blood pressure. Some of these risk factors are under your control, including: Smoking. Not getting enough exercise or physical activity. Being overweight. Having too much fat, sugar, calories, or salt (sodium) in your diet. Drinking too much alcohol. Other risk factors include: Having a personal history of heart disease, diabetes, high cholesterol, or kidney disease. Stress. Having a family history of high blood pressure and high cholesterol. Having obstructive sleep apnea. Age. The risk increases with age. What are the signs or symptoms? High blood pressure may not cause symptoms. Very high blood pressure (hypertensive crisis) may cause: Headache. Fast or irregular heartbeats (palpitations). Shortness of breath. Nosebleed. Nausea and vomiting. Vision changes. Severe chest pain, dizziness, and seizures. How is this diagnosed? This condition is diagnosed by measuring your blood pressure while you are seated, with your arm  resting on a flat surface, your legs uncrossed, and your feet flat on the  floor. The cuff of the blood pressure monitor will be placed directly against the skin of your upper arm at the level of your heart. Blood pressure should be measured at least twice using the same arm. Certain conditions can cause a difference in blood pressure between your right and left arms. If you have a high blood pressure reading during one visit or you have normal blood pressure with other risk factors, you may be asked to: Return on a different day to have your blood pressure checked again. Monitor your blood pressure at home for 1 week or longer. If you are diagnosed with hypertension, you may have other blood or imaging tests to help your health care provider understand your overall risk for other conditions. How is this treated? This condition is treated by making healthy lifestyle changes, such as eating healthy foods, exercising more, and reducing your alcohol intake. You may be referred for counseling on a healthy diet and physical activity. Your health care provider may prescribe medicine if lifestyle changes are not enough to get your blood pressure under control and if: Your systolic blood pressure is above 130. Your diastolic blood pressure is above 80. Your personal target blood pressure may vary depending on your medical conditions, your age, and other factors. Follow these instructions at home: Eating and drinking  Eat a diet that is high in fiber and potassium, and low in sodium, added sugar, and fat. An example of this eating plan is called the DASH diet. DASH stands for Dietary Approaches to Stop Hypertension. To eat this way: Eat plenty of fresh fruits and vegetables. Try to fill one half of your plate at each meal with fruits and vegetables. Eat whole grains, such as whole-wheat pasta, brown rice, or whole-grain bread. Fill about one fourth of your plate with whole grains. Eat or drink low-fat dairy products, such as skim milk or low-fat yogurt. Avoid fatty cuts of meat,  processed or cured meats, and poultry with skin. Fill about one fourth of your plate with lean proteins, such as fish, chicken without skin, beans, eggs, or tofu. Avoid pre-made and processed foods. These tend to be higher in sodium, added sugar, and fat. Reduce your daily sodium intake. Many people with hypertension should eat less than 1,500 mg of sodium a day. Do not drink alcohol if: Your health care provider tells you not to drink. You are pregnant, may be pregnant, or are planning to become pregnant. If you drink alcohol: Limit how much you have to: 0-1 drink a day for women. 0-2 drinks a day for men. Know how much alcohol is in your drink. In the [place] one drink equals one 12 oz bottle of beer (355 mL), one 5 oz glass of wine (148 mL), or one 1 oz glass of hard liquor (44 mL). Lifestyle  Work with your health care provider to maintain a healthy body weight or to lose weight. Ask what an ideal weight is for you. Get at least 30 minutes of exercise that causes your heart to beat faster (aerobic exercise) most days of the week. Activities may include walking, swimming, or biking. Include exercise to strengthen your muscles (resistance exercise), such as Pilates or lifting weights, as part of your weekly exercise routine. Try to do these types of exercises for 30 minutes at least 3 days a week. Do not use any products that contain nicotine or tobacco. These  products include cigarettes, chewing tobacco, and vaping devices, such as e-cigarettes. If you need help quitting, ask your health care provider. Monitor your blood pressure at home as told by your health care provider. Keep all follow-up visits. This is important. Medicines Take over-the-counter and prescription medicines only as told by your health care provider. Follow directions carefully. Blood pressure medicines must be taken as prescribed. Do not skip doses of blood pressure medicine. Doing this puts you at risk for problems and  can make the medicine less effective. Ask your health care provider about side effects or reactions to medicines that you should watch for. Contact a health care provider if you: Think you are having a reaction to a medicine you are taking. Have headaches that keep coming back (recurring). Feel dizzy. Have swelling in your ankles. Have trouble with your vision. Get help right away if you: Develop a severe headache or confusion. Have unusual weakness or numbness. Feel faint. Have severe pain in your chest or abdomen. Vomit repeatedly. Have trouble breathing. These symptoms may be an emergency. Get help right away. Call 911. Do not wait to see if the symptoms will go away. Do not drive yourself to the hospital. Summary Hypertension is when the force of blood pumping through your arteries is too strong. If this condition is not controlled, it may put you at risk for serious complications. Your personal target blood pressure may vary depending on your medical conditions, your age, and other factors. For most people, a normal blood pressure is less than 120/80. Hypertension is treated with lifestyle changes, medicines, or a combination of both. Lifestyle changes include losing weight, eating a healthy, low-sodium diet, exercising more, and limiting alcohol. This information is not intended to replace advice given to you by your health care provider. Make sure you discuss any questions you have with your health care provider. Document Revised: 01/06/2021 Document Reviewed: 01/06/2021 Elsevier Patient Education  2024 Elsevier Inc.   Edwina Barth, MD [facility]

## 2023-06-07 NOTE — Assessment & Plan Note (Signed)
Chronic stable conditions Diet and nutrition discussed Continues atorvastatin 20 mg daily

## 2023-06-07 NOTE — Telephone Encounter (Signed)
 Chief Complaint: Hypertension  Symptoms: Headache, blurred vision Frequency: Constant for 3 days  Pertinent Negatives: Patient denies chest pain, fever, nausea, vomiting, weakness Disposition: [] ED /[] Urgent Care (no appt availability in office) / [x] Appointment(In office/virtual)/ []  [facility]/ [] Home Care/ [] Refused Recommended Disposition /[] [facility]/ []  Follow-up with PCP Additional Notes: Patient was seen at [facility] yesterday for hypertensive urgency.  Patient was advised to schedule an appointment with PCP today for follow-up and management of hypertension. Patient states he takes Amlodipine daily and has not missed a dose. Patient reports a continued headache and blurred vision today. Care advice was given and patient has been scheduled for an office visit today with PCP.  Copied from CRM [id number]. Topic: Clinical - Red Word Triage >> Jun 07, 2023  9:05 AM Lennart Pall wrote: Red Word that prompted transfer to Nurse Triage: Patient went to ER due ot high blood pressure. He is still not feeling well and BP is still high and he has very bad headaches Reason for Disposition  [1] Systolic BP  >= 200 OR Diastolic >= 120 AND [2] having NO cardiac or neurologic symptoms  Answer Assessment - Initial Assessment Questions 1. BLOOD PRESSURE: "What is the blood pressure?" "Did you take at least two measurements 5 minutes apart?"     It was high yesterday at urgent care  2. ONSET: "When did you take your blood pressure?"     3 days ago  3. HOW: "How did you take your blood pressure?" (e.g., automatic home BP monitor, visiting nurse)     Urgent Care staff  4. HISTORY: "Do you have a history of high blood pressure?"     Yes  5. MEDICINES: "Are you taking any medicines for blood pressure?" "Have you missed any doses recently?"     Yes, I take it everyday  6. OTHER SYMPTOMS: "Do you have any symptoms?" (e.g., blurred vision, chest pain, difficulty breathing,  headache, weakness)     Headache, blurred vision, SOB  Protocols used: Blood Pressure - High-A-AH

## 2023-06-07 NOTE — Assessment & Plan Note (Signed)
Diet and nutrition discussed Continues atorvastatin 20 mg daily

## 2023-06-07 NOTE — Patient Instructions (Signed)
 Stop losartan Start valsartan HCT 320-12.5 mg daily Continue amlodipine 5 mg daily Continue monitoring blood pressure readings at home daily for the next several weeks and keep a log.  Contact the office if numbers persistently abnormal Continue weekly Ozempic and daily metformin Decrease Tresiba insulin by 5 units and continue checking glucose levels especially in the morning.  Hypertension, Adult High blood pressure (hypertension) is when the force of blood pumping through the arteries is too strong. The arteries are the blood vessels that carry blood from the heart throughout the body. Hypertension forces the heart to work harder to pump blood and may cause arteries to become narrow or stiff. Untreated or uncontrolled hypertension can lead to a heart attack, heart failure, a stroke, kidney disease, and other problems. A blood pressure reading consists of a higher number over a lower number. Ideally, your blood pressure should be below 120/80. The first ("top") number is called the systolic pressure. It is a measure of the pressure in your arteries as your heart beats. The second ("bottom") number is called the diastolic pressure. It is a measure of the pressure in your arteries as the heart relaxes. What are the causes? The exact cause of this condition is not known. There are some conditions that result in high blood pressure. What increases the risk? Certain factors may make you more likely to develop high blood pressure. Some of these risk factors are under your control, including: Smoking. Not getting enough exercise or physical activity. Being overweight. Having too much fat, sugar, calories, or salt (sodium) in your diet. Drinking too much alcohol. Other risk factors include: Having a personal history of heart disease, diabetes, high cholesterol, or kidney disease. Stress. Having a family history of high blood pressure and high cholesterol. Having obstructive sleep apnea. Age. The  risk increases with age. What are the signs or symptoms? High blood pressure may not cause symptoms. Very high blood pressure (hypertensive crisis) may cause: Headache. Fast or irregular heartbeats (palpitations). Shortness of breath. Nosebleed. Nausea and vomiting. Vision changes. Severe chest pain, dizziness, and seizures. How is this diagnosed? This condition is diagnosed by measuring your blood pressure while you are seated, with your arm resting on a flat surface, your legs uncrossed, and your feet flat on the floor. The cuff of the blood pressure monitor will be placed directly against the skin of your upper arm at the level of your heart. Blood pressure should be measured at least twice using the same arm. Certain conditions can cause a difference in blood pressure between your right and left arms. If you have a high blood pressure reading during one visit or you have normal blood pressure with other risk factors, you may be asked to: Return on a different day to have your blood pressure checked again. Monitor your blood pressure at home for 1 week or longer. If you are diagnosed with hypertension, you may have other blood or imaging tests to help your health care provider understand your overall risk for other conditions. How is this treated? This condition is treated by making healthy lifestyle changes, such as eating healthy foods, exercising more, and reducing your alcohol intake. You may be referred for counseling on a healthy diet and physical activity. Your health care provider may prescribe medicine if lifestyle changes are not enough to get your blood pressure under control and if: Your systolic blood pressure is above 130. Your diastolic blood pressure is above 80. Your personal target blood pressure may vary depending  on your medical conditions, your age, and other factors. Follow these instructions at home: Eating and drinking  Eat a diet that is high in fiber and  potassium, and low in sodium, added sugar, and fat. An example of this eating plan is called the DASH diet. DASH stands for Dietary Approaches to Stop Hypertension. To eat this way: Eat plenty of fresh fruits and vegetables. Try to fill one half of your plate at each meal with fruits and vegetables. Eat whole grains, such as whole-wheat pasta, brown rice, or whole-grain bread. Fill about one fourth of your plate with whole grains. Eat or drink low-fat dairy products, such as skim milk or low-fat yogurt. Avoid fatty cuts of meat, processed or cured meats, and poultry with skin. Fill about one fourth of your plate with lean proteins, such as fish, chicken without skin, beans, eggs, or tofu. Avoid pre-made and processed foods. These tend to be higher in sodium, added sugar, and fat. Reduce your daily sodium intake. Many people with hypertension should eat less than 1,500 mg of sodium a day. Do not drink alcohol if: Your health care provider tells you not to drink. You are pregnant, may be pregnant, or are planning to become pregnant. If you drink alcohol: Limit how much you have to: 0-1 drink a day for women. 0-2 drinks a day for men. Know how much alcohol is in your drink. In the [place] one drink equals one 12 oz bottle of beer (355 mL), one 5 oz glass of wine (148 mL), or one 1 oz glass of hard liquor (44 mL). Lifestyle  Work with your health care provider to maintain a healthy body weight or to lose weight. Ask what an ideal weight is for you. Get at least 30 minutes of exercise that causes your heart to beat faster (aerobic exercise) most days of the week. Activities may include walking, swimming, or biking. Include exercise to strengthen your muscles (resistance exercise), such as Pilates or lifting weights, as part of your weekly exercise routine. Try to do these types of exercises for 30 minutes at least 3 days a week. Do not use any products that contain nicotine or tobacco. These products  include cigarettes, chewing tobacco, and vaping devices, such as e-cigarettes. If you need help quitting, ask your health care provider. Monitor your blood pressure at home as told by your health care provider. Keep all follow-up visits. This is important. Medicines Take over-the-counter and prescription medicines only as told by your health care provider. Follow directions carefully. Blood pressure medicines must be taken as prescribed. Do not skip doses of blood pressure medicine. Doing this puts you at risk for problems and can make the medicine less effective. Ask your health care provider about side effects or reactions to medicines that you should watch for. Contact a health care provider if you: Think you are having a reaction to a medicine you are taking. Have headaches that keep coming back (recurring). Feel dizzy. Have swelling in your ankles. Have trouble with your vision. Get help right away if you: Develop a severe headache or confusion. Have unusual weakness or numbness. Feel faint. Have severe pain in your chest or abdomen. Vomit repeatedly. Have trouble breathing. These symptoms may be an emergency. Get help right away. Call 911. Do not wait to see if the symptoms will go away. Do not drive yourself to the hospital. Summary Hypertension is when the force of blood pumping through your arteries is too strong. If this condition  is not controlled, it may put you at risk for serious complications. Your personal target blood pressure may vary depending on your medical conditions, your age, and other factors. For most people, a normal blood pressure is less than 120/80. Hypertension is treated with lifestyle changes, medicines, or a combination of both. Lifestyle changes include losing weight, eating a healthy, low-sodium diet, exercising more, and limiting alcohol. This information is not intended to replace advice given to you by your health care provider. Make sure you discuss  any questions you have with your health care provider. Document Revised: 01/06/2021 Document Reviewed: 01/06/2021 Elsevier Patient Education  2024 [employer].

## 2023-06-07 NOTE — Assessment & Plan Note (Signed)
 BP Readings from Last 3 Encounters:  06/07/23 (!) 174/92  06/06/23 (!) 168/93  05/30/23 (!) 168/90  Cardiovascular risks associated with uncontrolled hypertension discussed Recommend to continue Lidopin 5 mg daily, stop losartan, and start valsartan HCT 320-12.5 mg daily Diet and nutrition discussed Has been having low blood sugar readings in the morning Not taking glipizide Continues weekly Ozempic and daily metformin Presently taking Tresiba 50 units daily Recommend to decrease daily insulin by 5 units and monitor blood sugars at home.  May need to decrease it even more depending on readings. Diet and nutrition discussed

## 2023-06-08 NOTE — Telephone Encounter (Signed)
 Yes

## 2023-06-09 ENCOUNTER — Other Ambulatory Visit: Payer: Self-pay | Admitting: Emergency Medicine

## 2023-06-09 ENCOUNTER — Other Ambulatory Visit (HOSPITAL_COMMUNITY): Payer: Self-pay

## 2023-06-09 NOTE — Telephone Encounter (Signed)
 Copied from CRM [id number]. Topic: Clinical - Medication Refill >> Jun 09, 2023  1:51 PM Aisha D wrote: Most Recent Primary Care Visit:  Provider: Georgina Quint  Department: [facility]  Visit Type: ACUTE  Date: 06/07/2023  Medication: gabapentin (NEURONTIN) 100 MG capsule  Has the patient contacted their pharmacy? Yes (Agent: If no, request that the patient contact the pharmacy for the refill. If patient does not wish to contact the pharmacy document the reason why and proceed with request.) (Agent: If yes, when and what did the pharmacy advise?) was advised to contact the pcp  Is this the correct pharmacy for this prescription? Yes If no, delete pharmacy and type the correct one.  This is the patient's preferred pharmacy:  [facility] [address] [place] [address] Phone: [phone] Fax: [phone]   Has the prescription been filled recently? No  Is the patient out of the medication? Yes  Has the patient been seen for an appointment in the last year OR does the patient have an upcoming appointment? Yes  Can we respond through MyChart? Yes  Agent: Please be advised that Rx refills may take up to 3 business days. We ask that you follow-up with your pharmacy.

## 2023-06-11 MED ORDER — GABAPENTIN 300 MG PO CAPS
300.0000 mg | ORAL_CAPSULE | Freq: Every day | ORAL | 1 refills | Status: DC
  Filled 2023-06-11 – 2023-06-24 (×4): qty 90, 90d supply, fill #0
  Filled 2023-09-07: qty 90, 90d supply, fill #1

## 2023-06-13 ENCOUNTER — Other Ambulatory Visit (HOSPITAL_COMMUNITY): Payer: Self-pay

## 2023-06-15 ENCOUNTER — Other Ambulatory Visit (HOSPITAL_COMMUNITY): Payer: Self-pay

## 2023-06-15 ENCOUNTER — Other Ambulatory Visit: Payer: Self-pay | Admitting: Emergency Medicine

## 2023-06-15 DIAGNOSIS — G6289 Other specified polyneuropathies: Secondary | ICD-10-CM

## 2023-06-15 NOTE — Telephone Encounter (Signed)
 I am not getting a good clinical picture from his description.  Recommend office visit.

## 2023-06-16 ENCOUNTER — Other Ambulatory Visit (HOSPITAL_COMMUNITY): Payer: Self-pay

## 2023-06-17 ENCOUNTER — Encounter (HOSPITAL_COMMUNITY): Payer: Self-pay

## 2023-06-17 ENCOUNTER — Other Ambulatory Visit (HOSPITAL_COMMUNITY): Payer: Self-pay

## 2023-06-21 ENCOUNTER — Other Ambulatory Visit (HOSPITAL_COMMUNITY): Payer: Self-pay

## 2023-06-23 ENCOUNTER — Other Ambulatory Visit (HOSPITAL_COMMUNITY): Payer: Self-pay

## 2023-06-24 ENCOUNTER — Other Ambulatory Visit (HOSPITAL_COMMUNITY): Payer: Self-pay

## 2023-06-24 ENCOUNTER — Other Ambulatory Visit: Payer: Self-pay

## 2023-06-30 ENCOUNTER — Other Ambulatory Visit (HOSPITAL_COMMUNITY): Payer: Self-pay

## 2023-07-04 ENCOUNTER — Other Ambulatory Visit (HOSPITAL_COMMUNITY): Payer: Self-pay

## 2023-07-05 ENCOUNTER — Ambulatory Visit: Admitting: Emergency Medicine

## 2023-07-13 ENCOUNTER — Other Ambulatory Visit (HOSPITAL_COMMUNITY): Payer: Self-pay

## 2023-07-19 ENCOUNTER — Other Ambulatory Visit (HOSPITAL_COMMUNITY): Payer: Self-pay

## 2023-07-19 ENCOUNTER — Ambulatory Visit (INDEPENDENT_AMBULATORY_CARE_PROVIDER_SITE_OTHER): Payer: 59 | Admitting: Internal Medicine

## 2023-07-19 ENCOUNTER — Encounter: Payer: Self-pay | Admitting: Internal Medicine

## 2023-07-19 VITALS — BP 130/80 | HR 62 | Ht 73.0 in | Wt 272.4 lb

## 2023-07-19 DIAGNOSIS — E7849 Other hyperlipidemia: Secondary | ICD-10-CM | POA: Diagnosis not present

## 2023-07-19 DIAGNOSIS — Z794 Long term (current) use of insulin: Secondary | ICD-10-CM | POA: Diagnosis not present

## 2023-07-19 DIAGNOSIS — E1141 Type 2 diabetes mellitus with diabetic mononeuropathy: Secondary | ICD-10-CM | POA: Diagnosis not present

## 2023-07-19 LAB — POCT GLYCOSYLATED HEMOGLOBIN (HGB A1C): Hemoglobin A1C: 7.1 % — AB (ref 4.0–5.6)

## 2023-07-19 MED ORDER — METFORMIN HCL ER 500 MG PO TB24
1000.0000 mg | ORAL_TABLET | Freq: Two times a day (BID) | ORAL | 3 refills | Status: AC
  Filled 2023-07-19: qty 360, 90d supply, fill #0
  Filled 2023-07-19: qty 150, 37d supply, fill #0
  Filled 2023-07-19: qty 210, 53d supply, fill #0
  Filled 2024-01-31: qty 360, 90d supply, fill #1

## 2023-07-19 MED ORDER — TRESIBA FLEXTOUCH 200 UNIT/ML ~~LOC~~ SOPN: 44.0000 [IU] | PEN_INJECTOR | Freq: Every day | SUBCUTANEOUS | Status: DC

## 2023-07-19 MED ORDER — TRESIBA FLEXTOUCH 200 UNIT/ML ~~LOC~~ SOPN
44.0000 [IU] | PEN_INJECTOR | Freq: Every day | SUBCUTANEOUS | 3 refills | Status: DC
  Filled 2023-07-19: qty 6, 27d supply, fill #0
  Filled 2023-07-19: qty 18, 81d supply, fill #0
  Filled 2023-08-18: qty 6, 27d supply, fill #1
  Filled 2023-09-12: qty 6, 27d supply, fill #2
  Filled 2023-10-10 (×2): qty 6, 27d supply, fill #3
  Filled 2023-11-08: qty 6, 27d supply, fill #4
  Filled 2023-12-05: qty 6, 27d supply, fill #5
  Filled 2023-12-31: qty 6, 27d supply, fill #6
  Filled 2024-01-31: qty 6, 27d supply, fill #7
  Filled 2024-02-28: qty 6, 27d supply, fill #8
  Filled 2024-03-21: qty 18, 81d supply, fill #9
  Filled 2024-03-30: qty 6, 27d supply, fill #9

## 2023-07-19 NOTE — Progress Notes (Signed)
 Patient ID: James Garcia, male   DOB: 1956-01-02, 68 y.o.   MRN: [id number]  HPI: James Garcia is a 68 y.o.-year-old male, returning for follow-up for DM2, dx in 2007, insulin -dependent since 2016, uncontrolled, with complications (diabetic retinopathy, peripheral neuropathy). Pt. previously saw Dr. Washington Hacker, but last visit with me 4 mo ago.  Interim history: No increased urination, nausea, chest pain.  He continues to have blurry vision, HAs, also muscle cramps at night. He works from 3 PM to 11 PM.   Since last visit, he had a hypertensive urgency with headache and blurred vision and presented to the emergency room 06/06/2023.  Blood pressure was 163/93.  He saw Dr. Vedia Geralds afterwards and he was having low blood sugars in the 50s and 60s in the morning (w/o glipizide ) >> he was advised to reduce Tresiba  to 45 units daily. Now he increased it back to 50.  Reviewed HbA1c: Lab Results  Component Value Date   HGBA1C 7.6 (A) 03/21/2023   HGBA1C 7.3 (H) 11/29/2022   HGBA1C 7.0 (A) 11/16/2022   HGBA1C 8.1 (A) 07/13/2022   HGBA1C 7.2 (A) 03/11/2022   HGBA1C 7.1 (A) 11/03/2021   HGBA1C 6.8 (A) 09/28/2021   HGBA1C 7.0 (A) 03/23/2021   HGBA1C 7.5 (A) 12/08/2020   HGBA1C 6.2 (A) 09/01/2020   Pt is on a regimen of: - Metformin  ER 1000 mg 2x a day, with meals - Glipizide  5 mg before a larger meal-started 06/2022 - at bedtime >> minutes before snacking at night >> out >> restarted >> stopped 2/2 lows (was taking it before a small meal) -  - long needles (8 mm) >> Tresiba  U200 50 units daily. - Ozempic  2 mg weekly He tried Farxiga  >> rash.  Pt checks his sugars 2x a day and they are: - am: 79-146, 179 >> 80-140 (on Glipizide ),77, 108-218 (off Glipizide ) >> 79-130 - 2h after b'fast: n/c - before lunch: 156 >> n/c - 2h after lunch: n/c - before dinner: n/c - 2h after dinner: n/c - bedtime:110-120 (on Glipizide ), 111-200, 268, 287 (off Glipizide ) >> 80s-120, 180 - nighttime: n/c Lowest sugar  was 87 >> 57 >> 77 >> 50; he has hypoglycemia awareness at 70s.  Highest sugar was 218 >> 276 >> 287 >> 180.  Glucometer: Freestyle Lite  Pt's meals are: - Breakfast: fried eggs + toast and coffee - Lunch: skips - Dinner: 7-8 am: meat + veggies (cafeteria at [facility]) - bedtime snack: chicken or fried egg  - no CKD, last BUN/creatinine:  Lab Results  Component Value Date   BUN 17 05/30/2023   BUN 16 11/29/2022   CREATININE 0.87 05/30/2023   CREATININE 0.87 11/29/2022   Lab Results  Component Value Date   MICRALBCREAT 0.5 11/29/2022   MICRALBCREAT 1.1 09/28/2021   MICRALBCREAT 0.7 12/24/2016  On Cozaar  100 mg daily.  - + HL; last set of lipids: Lab Results  Component Value Date   CHOL 117 05/30/2023   HDL 44.70 05/30/2023   LDLCALC 60 05/30/2023   TRIG 63.0 05/30/2023   CHOLHDL 3 05/30/2023  On Lipitor 20 mg daily.  - last eye exam was 2023. + DR reportedly. Coming up in June.  - + numbness and tingling in his feet.  Last foot exam 03/2023. On Neurontin . He did not try the ALA. he established care with podiatry since last visit.  He has a history of HTN, constipation, history of gallbladder stone, history of TB.  ROS: + see HPI  Past Medical History:  Diagnosis Date   Allergy     "IN MORNING SNEEZE   Bronchitis    CHRONIC   Constipation    uses OTC laxatives - hard stools every morning- small amount and feels like doesnt empty    Diabetes mellitus without complication (HCC)    DM type 2 (diabetes mellitus, type 2) (HCC)    GERD (gastroesophageal reflux disease)    Hyperlipidemia    Hypertension    Leg cramps    Tuberculin skin test (TST) positive    Tuberculosis    pt states was treated for TB   Past Surgical History:  Procedure Laterality Date   arm surgery     COLONOSCOPY     ELBOW SURGERY Right    FOOT SURGERY Bilateral    POLYPECTOMY     Social History   Socioeconomic History   Marital status: Married    Spouse name: Not on file    Number of children: 2   Years of education: Not on file   Highest education level: Not on file  Occupational History   Occupation: Floor Tech at [employer]   Tobacco Use   Smoking status: Former    Current packs/day: 0.00    Average packs/day: 0.3 packs/day for 5.0 years (1.3 ttl pk-yrs)    Types: Cigarettes    Start date: 06/03/1973    Quit date: 06/04/1978    Years since quitting: 45.1    Passive exposure: Never   Smokeless tobacco: Never  Vaping Use   Vaping status: Never Used  Substance and Sexual Activity   Alcohol use: No   Drug use: No   Sexual activity: Not on file  Other Topics Concern   Not on file  Social History Narrative   Lives with family       Social Drivers of Health   Financial Resource Strain: Not on file  Food Insecurity: Not on file  Transportation Needs: Not on file  Physical Activity: Not on file  Stress: Not on file  Social Connections: Not on file  Intimate Partner Violence: Not on file   Current Outpatient Medications on File Prior to Visit  Medication Sig Dispense Refill   amLODipine  (NORVASC ) 5 MG tablet Take 1 tablet (5 mg total) by mouth daily. 90 tablet 3   atorvastatin  (LIPITOR) 20 MG tablet Take 1 tablet (20 mg total) by mouth daily. 90 tablet 3   Blood Glucose Monitoring Suppl (FREESTYLE LITE) w/Device KIT Use to test blood sugar daily 1 kit 0   Blood Glucose Monitoring Suppl (ONETOUCH VERIO FLEX SYSTEM) w/Device KIT Use as advised 1 kit 0   Blood Pressure Monitor KIT Use as instructed for HTN 1 each 0   Blood Pressure Monitoring (OMRON 3 SERIES BP MONITOR) DEVI Use as directed 1 each 0   cetirizine  (ZYRTEC ) 10 MG tablet Take 10 mg by mouth as needed.     DULoxetine  (CYMBALTA ) 30 MG capsule Take 1 capsule (30 mg total) by mouth daily. 30 capsule 3   fluticasone  (FLONASE ) 50 MCG/ACT nasal spray Place 1-2 sprays into both nostrils daily. 16 g 0   gabapentin  (NEURONTIN ) 300 MG capsule Take 1 capsule (300 mg total) by mouth at bedtime. 90  capsule 1   glucose blood (ONETOUCH VERIO) test strip Use as instructed 3 times a day 300 each 3   insulin  degludec (TRESIBA  FLEXTOUCH) 200 UNIT/ML FlexTouch Pen Inject 50 Units into the skin daily. 18 mL 3   Insulin  Pen Needle (TECHLITE PLUS  PEN NEEDLES) 32G X 4 MM MISC Use once daily to administer insulin . 100 each 3   Lancets (FREESTYLE) lancets USE TO CHECK BLOOD SUGAR 2 TIMES PER DAY. 200 each 2   metFORMIN  (GLUCOPHAGE -XR) 500 MG 24 hr tablet Take 2 tablets (1,000 mg total) by mouth 2 (two) times daily with a meal. 360 tablet 3   methylPREDNISolone  (MEDROL  DOSEPAK) 4 MG TBPK tablet Take as directed 21 tablet 1   neomycin -bacitracin -polymyxin (NEOSPORIN) ointment Apply 1 application topically every 12 (twelve) hours. 15 g 0   omeprazole  (PRILOSEC) 20 MG capsule Take 1 capsule (20 mg total) by mouth every morning. 90 capsule 0   OneTouch Delica Lancets 33G MISC Use 3x a day 300 each 3   pramipexole  (MIRAPEX ) 0.5 MG tablet Take 1 tablet (0.5 mg total) by mouth 3 (three) times daily. 30 tablet 5   Semaglutide , 2 MG/DOSE, (OZEMPIC , 2 MG/DOSE,) 8 MG/3ML SOPN Inject 2 mg into the skin once a week. 9 mL 3   valsartan -hydrochlorothiazide  (DIOVAN -HCT) 320-12.5 MG tablet Take 1 tablet by mouth daily. 90 tablet 3   [DISCONTINUED] albuterol  (PROVENTIL  HFA;VENTOLIN  HFA) 108 (90 Base) MCG/ACT inhaler Inhale 1-2 puffs into the lungs every 4 (four) hours as needed for wheezing or shortness of breath. 1 Inhaler 0   Current Facility-Administered Medications on File Prior to Visit  Medication Dose Route Frequency Provider Last Rate Last Admin   0.9 %  sodium chloride  infusion  500 mL Intravenous Once Pyrtle, Amber Bail, MD       Allergies  Allergen Reactions   Insulin  Glargine Hives    specifically Semglee    Family History  Problem Relation Age of Onset   Diabetes Father    Colon cancer Neg Hx    Colon polyps Neg Hx    Rectal cancer Neg Hx    Stomach cancer Neg Hx    Crohn's disease Neg Hx    Esophageal  cancer Neg Hx    Ulcerative colitis Neg Hx    PE: BP 130/80   Pulse 62   Ht 6\' 1"  (1.854 m)   Wt 272 lb 6.4 oz (123.6 kg)   SpO2 98%   BMI 35.94 kg/m  Wt Readings from Last 10 Encounters:  07/19/23 272 lb 6.4 oz (123.6 kg)  06/07/23 280 lb (127 kg)  05/30/23 279 lb (126.6 kg)  03/29/23 280 lb (127 kg)  03/21/23 280 lb 9.6 oz (127.3 kg)  11/29/22 278 lb 2 oz (126.2 kg)  11/16/22 276 lb 3.2 oz (125.3 kg)  07/13/22 271 lb 9.6 oz (123.2 kg)  06/30/22 273 lb 4 oz (123.9 kg)  03/11/22 271 lb 6.4 oz (123.1 kg)   Constitutional: overweight, in NAD Eyes: no exophthalmos ENT: no thyromegaly, no cervical lymphadenopathy Cardiovascular: RRR, No MRG, + mild B pitting periankle edema Respiratory: CTA B Musculoskeletal: + deformities - L elbow deformity after being dislocated and healing in the wrong position Skin:  no rashes Neurological: no tremor with outstretched hands  ASSESSMENT: 1. DM2, insulin -dependent, uncontrolled, with complications - DR - PN  2. HL  PLAN:  1. Patient with longstanding, uncontrolled, type 2 diabetes, on oral antidiabetic regimen with metformin  and sulfonylurea, also basal insulin  and weekly GLP-1 receptor agonist, with still poor control.  At last visit HbA1c was higher, increased from 7.3 to 7.6%.  At that time, he felt that sugars were worse after running out of the glipizide  approximately 1 month prior to the appointment.  We added this back and I also recommended  to try to stop the snacks at night. - of note, we do not have him on SGLT2 inhibitor as he previously had a rash with Farxiga  - At today's visit, sugars are much better mostly at goal, with previous blood sugars in the 50s and 60s but no more lows after stopping glipizide .  Upon questioning, however, he was taking it before a snack or regular meal, rather than before a larger meal, as advised.  However, based on the sugars now, we do not need to restart it.  In fact, I advised him to reduce the  Tresiba  to avoid low blood sugars in the 70s.  We can continue the rest of the regimen. - I suggested to:  Patient Instructions  Please continue: - Metformin  ER 1000 mg 2x a day, with meals - Ozempic  2 mg weekly  Please decrease: - Tresiba  U200 44 units daily  Please return in 4 months with your sugar log.   - we checked his HbA1c: 7.1% (improved) - advised to check sugars at different times of the day - 1x a day, rotating check times - advised for yearly eye exams >> he is not UTD but has an appointment coming up - I prev. recommended alpha lipoic acid 600 mg twice a day for neuropathy symptoms but he forgot.  He is on Neurontin .   At last visit I recommended to see podiatry due to the significant neuropathy and heaviness in his feet and muscle cramps.  He established care with Dr. Michalene Agee 11/27/2023. - return to clinic in 4 months  2. HL - Latest lipid panel was reviewed from 05/2023: All fractions at goal: Lab Results  Component Value Date   CHOL 117 05/30/2023   HDL 44.70 05/30/2023   LDLCALC 60 05/30/2023   TRIG 63.0 05/30/2023   CHOLHDL 3 05/30/2023  - He continues Lipitor 20 mg daily.  He has some muscle cramps but unclear if from the statin.  I previously recommended starting magnesium and multivitamins and staying well-hydrated.  Emilie Harden, MD PhD [facility]

## 2023-07-19 NOTE — Patient Instructions (Addendum)
 Please continue: - Metformin  ER 1000 mg 2x a day, with meals - Ozempic  2 mg weekly  Please decrease: - Tresiba  U200 44 units daily  Please return in 4 months with your sugar log.

## 2023-07-19 NOTE — Addendum Note (Signed)
 Addended by: Emilie Harden on: 07/19/2023 02:04 PM   Modules accepted: Orders

## 2023-07-21 ENCOUNTER — Encounter: Payer: Self-pay | Admitting: Internal Medicine

## 2023-07-21 LAB — MICROALBUMIN / CREATININE URINE RATIO
Creatinine, Urine: 71 mg/dL (ref 20–320)
Microalb, Ur: <0.2 mg/dL

## 2023-07-29 ENCOUNTER — Other Ambulatory Visit: Payer: Self-pay | Admitting: Internal Medicine

## 2023-08-01 ENCOUNTER — Other Ambulatory Visit (HOSPITAL_COMMUNITY): Payer: Self-pay

## 2023-08-01 MED ORDER — FREESTYLE LITE TEST VI STRP
ORAL_STRIP | 3 refills | Status: AC
  Filled 2023-08-01: qty 200, 67d supply, fill #0
  Filled 2023-12-09: qty 200, 67d supply, fill #1
  Filled 2024-02-16: qty 200, 67d supply, fill #2
  Filled 2024-04-06: qty 200, 67d supply, fill #0

## 2023-08-18 ENCOUNTER — Other Ambulatory Visit (HOSPITAL_COMMUNITY): Payer: Self-pay

## 2023-08-18 DIAGNOSIS — H40013 Open angle with borderline findings, low risk, bilateral: Secondary | ICD-10-CM | POA: Diagnosis not present

## 2023-08-18 DIAGNOSIS — H2513 Age-related nuclear cataract, bilateral: Secondary | ICD-10-CM | POA: Diagnosis not present

## 2023-08-18 DIAGNOSIS — H04123 Dry eye syndrome of bilateral lacrimal glands: Secondary | ICD-10-CM | POA: Diagnosis not present

## 2023-08-18 DIAGNOSIS — E113293 Type 2 diabetes mellitus with mild nonproliferative diabetic retinopathy without macular edema, bilateral: Secondary | ICD-10-CM | POA: Diagnosis not present

## 2023-08-18 LAB — HM DIABETES EYE EXAM

## 2023-09-12 ENCOUNTER — Other Ambulatory Visit (HOSPITAL_COMMUNITY): Payer: Self-pay

## 2023-09-27 ENCOUNTER — Other Ambulatory Visit (HOSPITAL_COMMUNITY): Payer: Self-pay

## 2023-10-10 ENCOUNTER — Other Ambulatory Visit: Payer: Self-pay

## 2023-10-10 ENCOUNTER — Other Ambulatory Visit (HOSPITAL_COMMUNITY): Payer: Self-pay

## 2023-10-17 ENCOUNTER — Other Ambulatory Visit (HOSPITAL_COMMUNITY): Payer: Self-pay

## 2023-11-22 ENCOUNTER — Other Ambulatory Visit: Payer: Self-pay

## 2023-11-22 ENCOUNTER — Other Ambulatory Visit (HOSPITAL_COMMUNITY): Payer: Self-pay

## 2023-11-22 ENCOUNTER — Ambulatory Visit (INDEPENDENT_AMBULATORY_CARE_PROVIDER_SITE_OTHER): Admitting: Internal Medicine

## 2023-11-22 ENCOUNTER — Encounter: Payer: Self-pay | Admitting: Internal Medicine

## 2023-11-22 VITALS — BP 126/70 | HR 86 | Ht 73.0 in | Wt 264.0 lb

## 2023-11-22 DIAGNOSIS — E7849 Other hyperlipidemia: Secondary | ICD-10-CM | POA: Diagnosis not present

## 2023-11-22 DIAGNOSIS — Z794 Long term (current) use of insulin: Secondary | ICD-10-CM

## 2023-11-22 DIAGNOSIS — E1141 Type 2 diabetes mellitus with diabetic mononeuropathy: Secondary | ICD-10-CM

## 2023-11-22 LAB — POCT GLYCOSYLATED HEMOGLOBIN (HGB A1C): Hemoglobin A1C: 7.3 % — AB (ref 4.0–5.6)

## 2023-11-22 MED ORDER — FREESTYLE LIBRE 3 READER DEVI
1.0000 | Freq: Once | 0 refills | Status: AC
  Filled 2023-11-22: qty 1, 90d supply, fill #0

## 2023-11-22 MED ORDER — TIRZEPATIDE 5 MG/0.5ML ~~LOC~~ SOAJ
5.0000 mg | SUBCUTANEOUS | 3 refills | Status: AC
  Filled 2023-11-22: qty 2, 28d supply, fill #0
  Filled 2024-01-19: qty 2, 28d supply, fill #1
  Filled 2024-02-16: qty 2, 28d supply, fill #2
  Filled 2024-03-14: qty 2, 28d supply, fill #3

## 2023-11-22 MED ORDER — FREESTYLE LIBRE 3 PLUS SENSOR MISC
1.0000 | 3 refills | Status: DC
  Filled 2023-11-22: qty 6, 90d supply, fill #0

## 2023-11-22 NOTE — Addendum Note (Signed)
 Addended by: OCTAVIO DIETRICH CROME on: 11/22/2023 02:26 PM   Modules accepted: Orders

## 2023-11-22 NOTE — Patient Instructions (Addendum)
 Please continue: - Metformin  ER 1000 mg 2x a day, with meals - Tresiba  U200 44 units daily  Please change from Ozempic  to Mounjaro  5 mg weekly. Let me know if we can increase the dose.  Try to rotate the injection sites more.  Please return in 4 months with your sugar log.

## 2023-11-22 NOTE — Progress Notes (Signed)
 Patient ID: James Garcia, male   DOB: 06/23/1955, 68 y.o.   MRN: [id number]  HPI: James Garcia is a 68 y.o.-year-old male, returning for follow-up for DM2, dx in 2007, insulin -dependent since 2016, uncontrolled, with complications (diabetic retinopathy, peripheral neuropathy). Pt. previously saw Dr. Kassie, but last visit with me 4 mo ago.  Interim history: No increased urination, nausea, chest pain.  He continues to have blurry vision, muscle cramps at night. He works from 3 PM to 11 PM.  He gets home around 12 AM.  Reviewed HbA1c: Lab Results  Component Value Date   HGBA1C 7.1 (A) 07/19/2023   HGBA1C 7.6 (A) 03/21/2023   HGBA1C 7.3 (H) 11/29/2022   HGBA1C 7.0 (A) 11/16/2022   HGBA1C 8.1 (A) 07/13/2022   HGBA1C 7.2 (A) 03/11/2022   HGBA1C 7.1 (A) 11/03/2021   HGBA1C 6.8 (A) 09/28/2021   HGBA1C 7.0 (A) 03/23/2021   HGBA1C 7.5 (A) 12/08/2020   Pt is on a regimen of: - Metformin  ER 1000 mg 2x a day, with meals -  - long needles (8 mm) >> Tresiba  U200 50 >> 44 units daily. - Ozempic  2 mg weekly He tried Farxiga  >> rash. Restart glipizide  due to the lows.  Pt checks his sugars 2x a day and they are: - am: 80-140 (on Glipizide ),77, 108-218 (off Glipizide ) >> 79-130 >> 84-178, 204 - 2h after b'fast: n/c >> 169, 194, 268, 291 - before lunch: 156 >> n/c - 2h after lunch: n/c - before dinner: n/c - 2h after dinner: n/c - bedtime:110-120 (on Glipizide ), 111-200, 268, 287 (off Glipizide ) >> 80s-120, 180 >> 100-164 - nighttime: n/c Lowest sugar was 57 >> 77 >> 50 >> 70s; he has hypoglycemia awareness at 70s.  Highest sugar was 287 >> 180 >> 291.  Glucometer: Freestyle Lite  Pt's meals are: - Breakfast: fried eggs + toast and coffee - Lunch: skips - Dinner: 7-8 am: meat + veggies (cafeteria at [facility]) - bedtime snack: chicken or fried egg  - no CKD, last BUN/creatinine:  Lab Results  Component Value Date   BUN 17 05/30/2023   BUN 16 11/29/2022   CREATININE 0.87  05/30/2023   CREATININE 0.87 11/29/2022   Lab Results  Component Value Date   MICRALBCREAT NOTE 07/19/2023  On Cozaar  100 mg daily.  - + HL; last set of lipids: Lab Results  Component Value Date   CHOL 117 05/30/2023   HDL 44.70 05/30/2023   LDLCALC 60 05/30/2023   TRIG 63.0 05/30/2023   CHOLHDL 3 05/30/2023  On Lipitor 20 mg daily.  - last eye exam was 08/18/2023. + DR.  - + numbness and tingling in his feet.  Last foot exam 03/2023. On Neurontin . He did not try the ALA.   He has a history of HTN, constipation, history of gallbladder stone, history of TB.  ROS: + see HPI  Past Medical History:  Diagnosis Date   Allergy     IN MORNING SNEEZE   Bronchitis    CHRONIC   Constipation    uses OTC laxatives - hard stools every morning- small amount and feels like doesnt empty    Diabetes mellitus without complication (HCC)    DM type 2 (diabetes mellitus, type 2) (HCC)    GERD (gastroesophageal reflux disease)    Hyperlipidemia    Hypertension    Leg cramps    Tuberculin skin test (TST) positive    Tuberculosis    pt states was treated for TB  Past Surgical History:  Procedure Laterality Date   arm surgery     COLONOSCOPY     ELBOW SURGERY Right    FOOT SURGERY Bilateral    POLYPECTOMY     Social History   Socioeconomic History   Marital status: Married    Spouse name: Not on file   Number of children: 2   Years of education: Not on file   Highest education level: Not on file  Occupational History   Occupation: Floor Tech at [employer]   Tobacco Use   Smoking status: Former    Current packs/day: 0.00    Average packs/day: 0.3 packs/day for 5.0 years (1.3 ttl pk-yrs)    Types: Cigarettes    Start date: 06/03/1973    Quit date: 06/04/1978    Years since quitting: 45.4    Passive exposure: Never   Smokeless tobacco: Never  Vaping Use   Vaping status: Never Used  Substance and Sexual Activity   Alcohol use: No   Drug use: No   Sexual activity: Not on file   Other Topics Concern   Not on file  Social History Narrative   Lives with family       Social Drivers of Health   Financial Resource Strain: Not on file  Food Insecurity: Not on file  Transportation Needs: Not on file  Physical Activity: Not on file  Stress: Not on file  Social Connections: Not on file  Intimate Partner Violence: Not on file   Current Outpatient Medications on File Prior to Visit  Medication Sig Dispense Refill   amLODipine  (NORVASC ) 5 MG tablet Take 1 tablet (5 mg total) by mouth daily. 90 tablet 3   atorvastatin  (LIPITOR) 20 MG tablet Take 1 tablet (20 mg total) by mouth daily. 90 tablet 3   Blood Glucose Monitoring Suppl (FREESTYLE LITE) w/Device KIT Use to test blood sugar daily 1 kit 0   Blood Glucose Monitoring Suppl (ONETOUCH VERIO FLEX SYSTEM) w/Device KIT Use as advised 1 kit 0   Blood Pressure Monitor KIT Use as instructed for HTN 1 each 0   Blood Pressure Monitoring (OMRON 3 SERIES BP MONITOR) DEVI Use as directed 1 each 0   cetirizine  (ZYRTEC ) 10 MG tablet Take 10 mg by mouth as needed.     DULoxetine  (CYMBALTA ) 30 MG capsule Take 1 capsule (30 mg total) by mouth daily. 30 capsule 3   fluticasone  (FLONASE ) 50 MCG/ACT nasal spray Place 1-2 sprays into both nostrils daily. 16 g 0   gabapentin  (NEURONTIN ) 300 MG capsule Take 1 capsule (300 mg total) by mouth at bedtime. 90 capsule 1   glucose blood (FREESTYLE LITE) test strip Use as instructed 3 times a day 300 each 3   insulin  degludec (TRESIBA  FLEXTOUCH) 200 UNIT/ML FlexTouch Pen Inject 44 Units into the skin daily. 18 mL 3   Insulin  Pen Needle (TECHLITE PLUS PEN NEEDLES) 32G X 4 MM MISC Use once daily to administer insulin . 100 each 3   Lancets (FREESTYLE) lancets USE TO CHECK BLOOD SUGAR 2 TIMES PER DAY. 200 each 2   metFORMIN  (GLUCOPHAGE -XR) 500 MG 24 hr tablet Take 2 tablets (1,000 mg total) by mouth 2 (two) times daily with a meal. 360 tablet 3   methylPREDNISolone  (MEDROL  DOSEPAK) 4 MG TBPK tablet  Take as directed 21 tablet 1   neomycin -bacitracin -polymyxin (NEOSPORIN) ointment Apply 1 application topically every 12 (twelve) hours. 15 g 0   omeprazole  (PRILOSEC) 20 MG capsule Take 1 capsule (20 mg total) by  mouth every morning. 90 capsule 0   OneTouch Delica Lancets 33G MISC Use 3x a day 300 each 3   pramipexole  (MIRAPEX ) 0.5 MG tablet Take 1 tablet (0.5 mg total) by mouth 3 (three) times daily. 30 tablet 5   Semaglutide , 2 MG/DOSE, (OZEMPIC , 2 MG/DOSE,) 8 MG/3ML SOPN Inject 2 mg into the skin once a week. 9 mL 3   valsartan -hydrochlorothiazide  (DIOVAN -HCT) 320-12.5 MG tablet Take 1 tablet by mouth daily. 90 tablet 3   [DISCONTINUED] albuterol  (PROVENTIL  HFA;VENTOLIN  HFA) 108 (90 Base) MCG/ACT inhaler Inhale 1-2 puffs into the lungs every 4 (four) hours as needed for wheezing or shortness of breath. 1 Inhaler 0   Current Facility-Administered Medications on File Prior to Visit  Medication Dose Route Frequency Provider Last Rate Last Admin   0.9 %  sodium chloride  infusion  500 mL Intravenous Once Pyrtle, Gordy HERO, MD       Allergies  Allergen Reactions   Insulin  Glargine Hives    specifically Semglee    Family History  Problem Relation Age of Onset   Diabetes Father    Colon cancer Neg Hx    Colon polyps Neg Hx    Rectal cancer Neg Hx    Stomach cancer Neg Hx    Crohn's disease Neg Hx    Esophageal cancer Neg Hx    Ulcerative colitis Neg Hx    PE: BP 126/70 (BP Location: Left Arm, Patient Position: Sitting, Cuff Size: Normal)   Pulse 86   Ht 6' 1 (1.854 m)   Wt 264 lb (119.7 kg)   SpO2 99%   BMI 34.83 kg/m  Wt Readings from Last 10 Encounters:  11/22/23 264 lb (119.7 kg)  07/19/23 272 lb 6.4 oz (123.6 kg)  06/07/23 280 lb (127 kg)  05/30/23 279 lb (126.6 kg)  03/29/23 280 lb (127 kg)  03/21/23 280 lb 9.6 oz (127.3 kg)  11/29/22 278 lb 2 oz (126.2 kg)  11/16/22 276 lb 3.2 oz (125.3 kg)  07/13/22 271 lb 9.6 oz (123.2 kg)  06/30/22 273 lb 4 oz (123.9 kg)    Constitutional: overweight, in NAD Eyes: no exophthalmos ENT: no thyromegaly, no cervical lymphadenopathy Cardiovascular: RRR, No MRG, + mild B pitting periankle edema Respiratory: CTA B Musculoskeletal: + deformities - L elbow deformity after being dislocated and healing in the wrong position Skin:  no rashes Neurological: no tremor with outstretched hands  ASSESSMENT: 1. DM2, insulin -dependent, uncontrolled, with complications - DR - PN  2. HL  PLAN:  1. Patient with longstanding, uncontrolled type 2 diabetes, on antidiabetic regimen with metformin  and sulfonylurea, and also basal long-acting insulin , with the dose decreased slightly at last visit.  He previously had low blood sugars in the 50s and 60s but these stopped after discontinuing glipizide .  Of note, we do not have him on an SGLT2 inhibitor as he previously had a rash with Farxiga .  He is tolerating the higher dose of Ozempic  well.  At last visit, HbA1c was lower, at 7.1%. -At today's visit, sugars are mostly at goal in the morning in the last 2 weeks, but he did have higher blood sugars before, and also has hyperglycemic spikes in the 200s after breakfast.  At today's visit, we reviewed together the meter records, but it is difficult to understand which sugars are checked before in which are checked after meals.  Since he is taking insulin , I did suggest to start a CGM.  He agrees with this. -I also suggested to try to change  from Ozempic  to Mounjaro  for stronger effect on his blood sugars.  I am hoping that this is covered for him.  Will continue the rest of the regimen. -We also discussed about rotating his injection sites more.  He does have scar tissue on his abdomen and I advised him to rotate the injection sites to the side of his abdomen and also his upper thighs. - I suggested to:  Patient Instructions  Please continue: - Metformin  ER 1000 mg 2x a day, with meals - Tresiba  U200 44 units daily  Please change from  Ozempic  to Mounjaro  5 mg weekly. Let me know if we can increase the dose.  Try to rotate the injection sites more.  Please return in 4 months with your sugar log.   - we checked his HbA1c: 7.3% (higher) - advised to check sugars at different times of the day - 1x a day, rotating check times - advised for yearly eye exams >> he is UTD - I prev. recommended alpha lipoic acid 600 mg twice a day for neuropathy symptoms but he forgot.  He is on Neurontin .   At last visit I recommended to see podiatry due to the significant neuropathy and heaviness in his feet and muscle cramps.  He established care with Dr. Silva 11/27/2022.  He still describes cramps in his feet and I advised him to start a multivitamin. - return to clinic in 4 months  2. HL - Latest lipid panel was reviewed from 05/2023: Fractions at goal: Lab Results  Component Value Date   CHOL 117 05/30/2023   HDL 44.70 05/30/2023   LDLCALC 60 05/30/2023   TRIG 63.0 05/30/2023   CHOLHDL 3 05/30/2023  - He continues Lipitor 20 mg daily.  He has some muscle cramps but unclear if from the statin.   Lela Fendt, MD PhD [facility]

## 2023-11-28 ENCOUNTER — Other Ambulatory Visit (HOSPITAL_COMMUNITY): Payer: Self-pay

## 2023-11-30 ENCOUNTER — Encounter: Payer: Self-pay | Admitting: Emergency Medicine

## 2023-11-30 ENCOUNTER — Ambulatory Visit: Admitting: Emergency Medicine

## 2023-11-30 VITALS — BP 134/70 | HR 65 | Temp 98.1°F | Ht 73.0 in | Wt 265.0 lb

## 2023-11-30 DIAGNOSIS — E785 Hyperlipidemia, unspecified: Secondary | ICD-10-CM | POA: Diagnosis not present

## 2023-11-30 DIAGNOSIS — I152 Hypertension secondary to endocrine disorders: Secondary | ICD-10-CM

## 2023-11-30 DIAGNOSIS — E1159 Type 2 diabetes mellitus with other circulatory complications: Secondary | ICD-10-CM

## 2023-11-30 DIAGNOSIS — E1169 Type 2 diabetes mellitus with other specified complication: Secondary | ICD-10-CM | POA: Diagnosis not present

## 2023-11-30 DIAGNOSIS — E1142 Type 2 diabetes mellitus with diabetic polyneuropathy: Secondary | ICD-10-CM | POA: Diagnosis not present

## 2023-11-30 NOTE — Assessment & Plan Note (Signed)
Chronic stable conditions Diet and nutrition discussed Continues atorvastatin 20 mg daily

## 2023-11-30 NOTE — Assessment & Plan Note (Signed)
 BP Readings from Last 3 Encounters:  11/30/23 134/70  11/22/23 126/70  07/19/23 130/80  Well-controlled hypertension Continue valsartan  HCT 320-12.5 mg and amlodipine  5 mg daily Hemoglobin A1c at 7.3 Continues Tresiba  insulin  44 units daily along with metformin  1000 mg twice a day and weekly Ozempic  Recently advised to switch from Ozempic  to Mounjaro  Cardiovascular risks associated with diabetes and hypertension discussed Diet and nutrition discussed Recommend follow-up in 6 months

## 2023-11-30 NOTE — Progress Notes (Signed)
 James Garcia 68 y.o.   Chief Complaint  Patient presents with   Follow-up    Patient here for 6 month f/u for HTN / DM. Patient mentions that he is still having constipation. He's also been having bad headaches . A1C 7.3 8 days ago, patient states he has not started the Mounjaro  yet     HISTORY OF PRESENT ILLNESS: This is a 68 y.o. male here for 17-month follow-up of diabetes and hypertension Lab Results  Component Value Date   HGBA1C 7.3 (A) 11/22/2023  Was able to follow-up with endocrinologist recently.  Assessment and plan as follows:  ASSESSMENT: 1. DM2, insulin -dependent, uncontrolled, with complications - DR - PN   2. HL   PLAN:  1. Patient with longstanding, uncontrolled type 2 diabetes, on antidiabetic regimen with metformin  and sulfonylurea, and also basal long-acting insulin , with the dose decreased slightly at last visit.  He previously had low blood sugars in the 50s and 60s but these stopped after discontinuing glipizide .  Of note, we do not have him on an SGLT2 inhibitor as he previously had a rash with Farxiga .  He is tolerating the higher dose of Ozempic  well.  At last visit, HbA1c was lower, at 7.1%. -At today's visit, sugars are mostly at goal in the morning in the last 2 weeks, but he did have higher blood sugars before, and also has hyperglycemic spikes in the 200s after breakfast.  At today's visit, we reviewed together the meter records, but it is difficult to understand which sugars are checked before in which are checked after meals.  Since he is taking insulin , I did suggest to start a CGM.  He agrees with this. -I also suggested to try to change from Ozempic  to Mounjaro  for stronger effect on his blood sugars.  I am hoping that this is covered for him.  Will continue the rest of the regimen. -We also discussed about rotating his injection sites more.  He does have scar tissue on his abdomen and I advised him to rotate the injection sites to the side of his  abdomen and also his upper thighs. - I suggested to:  Patient Instructions  Please continue: - Metformin  ER 1000 mg 2x a day, with meals - Tresiba  U200 44 units daily   Please change from Ozempic  to Mounjaro  5 mg weekly. Let me know if we can increase the dose.   Try to rotate the injection sites more.   Please return in 4 months with your sugar log.    - we checked his HbA1c: 7.3% (higher) - advised to check sugars at different times of the day - 1x a day, rotating check times - advised for yearly eye exams >> he is UTD - I prev. recommended alpha lipoic acid 600 mg twice a day for neuropathy symptoms but he forgot.  He is on Neurontin .   At last visit I recommended to see podiatry due to the significant neuropathy and heaviness in his feet and muscle cramps.  He established care with Dr. Silva 11/27/2022.  He still describes cramps in his feet and I advised him to start a multivitamin. - return to clinic in 4 months   2. HL - Latest lipid panel was reviewed from 05/2023: Fractions at goal: Recent Labs       Lab Results  Component Value Date    CHOL 117 05/30/2023    HDL 44.70 05/30/2023    LDLCALC 60 05/30/2023    TRIG 63.0 05/30/2023  CHOLHDL 3 05/30/2023    - He continues Lipitor 20 mg daily.  He has some muscle cramps but unclear if from the statin.    Lela Fendt, MD PhD [facility]  HPI   Prior to Admission medications   Medication Sig Start Date End Date Taking? Authorizing Provider  amLODipine  (NORVASC ) 5 MG tablet Take 1 tablet (5 mg total) by mouth daily. 01/05/23  Yes Jihaad Bruschi, Emil Schanz, MD  atorvastatin  (LIPITOR) 20 MG tablet Take 1 tablet (20 mg total) by mouth daily. 01/05/23  Yes Grayson White, Emil Schanz, MD  Blood Glucose Monitoring Suppl (FREESTYLE LITE) w/Device KIT Use to test blood sugar daily 03/23/21  Yes Kassie Mallick, MD  Blood Glucose Monitoring Suppl [facility] VERIO FLEX SYSTEM) w/Device KIT Use as advised 07/13/22  Yes Fendt Lela, MD  Blood Pressure Monitor KIT Use as instructed for HTN 07/12/17  Yes Levora Reyes SAUNDERS, MD  Blood Pressure Monitoring (OMRON 3 SERIES BP MONITOR) DEVI Use as directed 03/15/23  Yes   cetirizine  (ZYRTEC ) 10 MG tablet Take 10 mg by mouth as needed. 08/30/19  Yes [provider]  Continuous Glucose Sensor (FREESTYLE LIBRE 3 PLUS SENSOR) MISC Change sensor every 15 (fifteen) days. 11/22/23  Yes Fendt Lela, MD  DULoxetine  (CYMBALTA ) 30 MG capsule Take 1 capsule (30 mg total) by mouth daily. 05/30/23 11/30/23 Yes Zoee Heeney, Emil Schanz, MD  fluticasone  (FLONASE ) 50 MCG/ACT nasal spray Place 1-2 sprays into both nostrils daily. 09/16/21  Yes Raspet, Erin K, PA-C  gabapentin  (NEURONTIN ) 300 MG capsule Take 1 capsule (300 mg total) by mouth at bedtime. 06/11/23  Yes Breah Joa, Emil Schanz, MD  glucose blood (FREESTYLE LITE) test strip Use as instructed 3 times a day 08/01/23  Yes Fendt Lela, MD  insulin  degludec (TRESIBA  FLEXTOUCH) 200 UNIT/ML FlexTouch Pen Inject 44 Units into the skin daily. 07/19/23  Yes Fendt Lela, MD  Insulin  Pen Needle (TECHLITE PLUS PEN NEEDLES) 32G X 4 MM MISC Use once daily to administer insulin . 11/23/22  Yes Fendt Lela, MD  Lancets (FREESTYLE) lancets USE TO CHECK BLOOD SUGAR 2 TIMES PER DAY. 12/18/20  Yes Lucy Woolever, Emil Schanz, MD  metFORMIN  (GLUCOPHAGE -XR) 500 MG 24 hr tablet Take 2 tablets (1,000 mg total) by mouth 2 (two) times daily with a meal. 07/19/23  Yes Fendt Lela, MD  neomycin -bacitracin -polymyxin (NEOSPORIN) ointment Apply 1 application topically every 12 (twelve) hours. 09/20/19  Yes Chick Venetia BRAVO, MD  omeprazole  (PRILOSEC) 20 MG capsule Take 1 capsule (20 mg total) by mouth every morning. 01/05/23  Yes Chanin Frumkin, Emil Schanz, MD  OneTouch Delica Lancets 33G MISC Use 3x a day 07/13/22  Yes Fendt Lela, MD  pramipexole  (MIRAPEX ) 0.5 MG tablet Take 1 tablet (0.5 mg total) by mouth 3 (three) times daily. 11/28/19  Yes Sater,  Charlie LABOR, MD  Semaglutide , 2 MG/DOSE, (OZEMPIC , 2 MG/DOSE,) 8 MG/3ML SOPN Inject 2 mg into the skin once a week. 03/29/23  Yes Fendt Lela, MD  tirzepatide  (MOUNJARO ) 5 MG/0.5ML Pen Inject 5 mg into the skin once a week. 11/22/23  Yes Fendt Lela, MD  valsartan -hydrochlorothiazide  (DIOVAN -HCT) 320-12.5 MG tablet Take 1 tablet by mouth daily. 06/07/23  Yes Evelyn Aguinaldo, Emil Schanz, MD  methylPREDNISolone  (MEDROL  DOSEPAK) 4 MG TBPK tablet Take as directed Patient not taking: Reported on 11/30/2023 06/30/22   Purcell Emil Schanz, MD  albuterol  (PROVENTIL  HFA;VENTOLIN  HFA) 108 (90 Base) MCG/ACT inhaler Inhale 1-2 puffs into the lungs every 4 (four) hours as needed for wheezing or shortness of breath. 12/24/17 08/29/19  Levora Reyes SAUNDERS, MD    Allergies  Allergen Reactions   Insulin  Glargine Hives    specifically Semglee     Patient Active Problem List   Diagnosis Date Noted   Diabetic polyneuropathy associated with type 2 diabetes mellitus (HCC) 05/30/2023   Type 2 diabetes mellitus with diabetic mononeuropathy, with long-term current use of insulin  (HCC) 07/13/2022   Degenerative disc disease, lumbar 06/30/2022   Paraumbilical hernia 12/01/2020   Calculus of gallbladder without cholecystitis without obstruction 12/01/2020   Diverticulosis 12/01/2020   Atherosclerosis of aorta (HCC) 12/01/2020   Hypertension associated with diabetes (HCC) 11/10/2020   Restless leg 11/28/2019   Dyslipidemia associated with type 2 diabetes mellitus (HCC) 05/01/2015    Past Medical History:  Diagnosis Date   Allergy     IN MORNING SNEEZE   Bronchitis    CHRONIC   Constipation    uses OTC laxatives - hard stools every morning- small amount and feels like doesnt empty    Diabetes mellitus without complication (HCC)    DM type 2 (diabetes mellitus, type 2) (HCC)    GERD (gastroesophageal reflux disease)    Hyperlipidemia    Hypertension    Leg cramps    Tuberculin skin test (TST) positive     Tuberculosis    pt states was treated for TB    Past Surgical History:  Procedure Laterality Date   arm surgery     COLONOSCOPY     ELBOW SURGERY Right    FOOT SURGERY Bilateral    POLYPECTOMY      Social History   Socioeconomic History   Marital status: Married    Spouse name: Not on file   Number of children: 2   Years of education: Not on file   Highest education level: Not on file  Occupational History   Occupation: Floor Tech at [employer]   Tobacco Use   Smoking status: Former    Current packs/day: 0.00    Average packs/day: 0.3 packs/day for 5.0 years (1.3 ttl pk-yrs)    Types: Cigarettes    Start date: 06/03/1973    Quit date: 06/04/1978    Years since quitting: 45.5    Passive exposure: Never   Smokeless tobacco: Never  Vaping Use   Vaping status: Never Used  Substance and Sexual Activity   Alcohol use: No   Drug use: No   Sexual activity: Not on file  Other Topics Concern   Not on file  Social History Narrative   Lives with family       Social Drivers of Health   Financial Resource Strain: Not on file  Food Insecurity: Not on file  Transportation Needs: Not on file  Physical Activity: Not on file  Stress: Not on file  Social Connections: Not on file  Intimate Partner Violence: Not on file    Family History  Problem Relation Age of Onset   Diabetes Father    Colon cancer Neg Hx    Colon polyps Neg Hx    Rectal cancer Neg Hx    Stomach cancer Neg Hx    Crohn's disease Neg Hx    Esophageal cancer Neg Hx    Ulcerative colitis Neg Hx      Review of Systems  Constitutional: Negative.  Negative for chills and fever.  HENT: Negative.  Negative for congestion and sore throat.   Respiratory: Negative.  Negative for cough and shortness of breath.   Cardiovascular: Negative.  Negative for chest pain and palpitations.  Gastrointestinal:  Negative for abdominal pain, diarrhea, nausea and vomiting.  Genitourinary: Negative.  Negative for dysuria and  hematuria.  Skin: Negative.  Negative for itching and rash.  Neurological: Negative.  Negative for dizziness and headaches.  All other systems reviewed and are negative.   Vitals:   11/30/23 1357  BP: 134/70  Pulse: 65  Temp: 98.1 F (36.7 C)  SpO2: 98%    Physical Exam Vitals reviewed.  Constitutional:      Appearance: Normal appearance.  HENT:     Head: Normocephalic.     Mouth/Throat:     Mouth: Mucous membranes are moist.     Pharynx: Oropharynx is clear.  Eyes:     Extraocular Movements: Extraocular movements intact.     Pupils: Pupils are equal, round, and reactive to light.  Cardiovascular:     Rate and Rhythm: Normal rate and regular rhythm.     Pulses: Normal pulses.     Heart sounds: Normal heart sounds.  Pulmonary:     Effort: Pulmonary effort is normal.     Breath sounds: Normal breath sounds.  Abdominal:     Palpations: Abdomen is soft.     Tenderness: There is no abdominal tenderness.  Musculoskeletal:     Cervical back: No tenderness.  Lymphadenopathy:     Cervical: No cervical adenopathy.  Skin:    General: Skin is warm and dry.     Capillary Refill: Capillary refill takes less than 2 seconds.  Neurological:     General: No focal deficit present.     Mental Status: He is alert and oriented to person, place, and time.  Psychiatric:        Mood and Affect: Mood normal.        Behavior: Behavior normal.      ASSESSMENT & PLAN: A total of 40 minutes was spent with the patient and counseling/coordination of care regarding preparing for this visit, review of most recent office visit notes, review of most recent office visit notes with endocrinologist, cardiovascular risks associated with diabetes and hypertension, review of multiple chronic medical conditions and their management, review of all medications, review of most recent bloodwork results, review of health maintenance items, education on nutrition, prognosis, documentation, and need for follow  up.   Problem List Items Addressed This Visit       Cardiovascular and Mediastinum   Hypertension associated with diabetes (HCC) - Primary   BP Readings from Last 3 Encounters:  11/30/23 134/70  11/22/23 126/70  07/19/23 130/80  Well-controlled hypertension Continue valsartan  HCT 320-12.5 mg and amlodipine  5 mg daily Hemoglobin A1c at 7.3 Continues Tresiba  insulin  44 units daily along with metformin  1000 mg twice a day and weekly Ozempic  Recently advised to switch from Ozempic  to Mounjaro  Cardiovascular risks associated with diabetes and hypertension discussed Diet and nutrition discussed Recommend follow-up in 6 months         Endocrine   Dyslipidemia associated with type 2 diabetes mellitus (HCC)   Chronic stable conditions Diet and nutrition discussed Continues atorvastatin  20 mg daily      Diabetic polyneuropathy associated with type 2 diabetes mellitus (HCC)   Chronic persistent symptoms affecting quality of life Was evaluated by podiatrist Gabapentin  not working Recommend trial of duloxetine  30 mg daily      Patient Instructions  Health Maintenance After Age 96 After age 21, you are at a higher risk for certain long-term diseases and infections as well as injuries from falls. Falls are a major cause of broken  bones and head injuries in people who are older than age 66. Getting regular preventive care can help to keep you healthy and well. Preventive care includes getting regular testing and making lifestyle changes as recommended by your health care provider. Talk with your health care provider about: Which screenings and tests you should have. A screening is a test that checks for a disease when you have no symptoms. A diet and exercise plan that is right for you. What should I know about screenings and tests to prevent falls? Screening and testing are the best ways to find a health problem early. Early diagnosis and treatment give you the best chance of managing  medical conditions that are common after age 63. Certain conditions and lifestyle choices may make you more likely to have a fall. Your health care provider may recommend: Regular vision checks. Poor vision and conditions such as cataracts can make you more likely to have a fall. If you wear glasses, make sure to get your prescription updated if your vision changes. Medicine review. Work with your health care provider to regularly review all of the medicines you are taking, including over-the-counter medicines. Ask your health care provider about any side effects that may make you more likely to have a fall. Tell your health care provider if any medicines that you take make you feel dizzy or sleepy. Strength and balance checks. Your health care provider may recommend certain tests to check your strength and balance while standing, walking, or changing positions. Foot health exam. Foot pain and numbness, as well as not wearing proper footwear, can make you more likely to have a fall. Screenings, including: Osteoporosis screening. Osteoporosis is a condition that causes the bones to get weaker and break more easily. Blood pressure screening. Blood pressure changes and medicines to control blood pressure can make you feel dizzy. Depression screening. You may be more likely to have a fall if you have a fear of falling, feel depressed, or feel unable to do activities that you used to do. Alcohol use screening. Using too much alcohol can affect your balance and may make you more likely to have a fall. Follow these instructions at home: Lifestyle Do not drink alcohol if: Your health care provider tells you not to drink. If you drink alcohol: Limit how much you have to: 0-1 drink a day for women. 0-2 drinks a day for men. Know how much alcohol is in your drink. In the [place] one drink equals one 12 oz bottle of beer (355 mL), one 5 oz glass of wine (148 mL), or one 1 oz glass of hard liquor (44 mL). Do  not use any products that contain nicotine or tobacco. These products include cigarettes, chewing tobacco, and vaping devices, such as e-cigarettes. If you need help quitting, ask your health care provider. Activity  Follow a regular exercise program to stay fit. This will help you maintain your balance. Ask your health care provider what types of exercise are appropriate for you. If you need a cane or walker, use it as recommended by your health care provider. Wear supportive shoes that have nonskid soles. Safety  Remove any tripping hazards, such as rugs, cords, and clutter. Install safety equipment such as grab bars in bathrooms and safety rails on stairs. Keep rooms and walkways well-lit. General instructions Talk with your health care provider about your risks for falling. Tell your health care provider if: You fall. Be sure to tell your health care provider about all  falls, even ones that seem minor. You feel dizzy, tiredness (fatigue), or off-balance. Take over-the-counter and prescription medicines only as told by your health care provider. These include supplements. Eat a healthy diet and maintain a healthy weight. A healthy diet includes low-fat dairy products, low-fat (lean) meats, and fiber from whole grains, beans, and lots of fruits and vegetables. Stay current with your vaccines. Schedule regular health, dental, and eye exams. Summary Having a healthy lifestyle and getting preventive care can help to protect your health and wellness after age 25. Screening and testing are the best way to find a health problem early and help you avoid having a fall. Early diagnosis and treatment give you the best chance for managing medical conditions that are more common for people who are older than age 62. Falls are a major cause of broken bones and head injuries in people who are older than age 43. Take precautions to prevent a fall at home. Work with your health care provider to learn what  changes you can make to improve your health and wellness and to prevent falls. This information is not intended to replace advice given to you by your health care provider. Make sure you discuss any questions you have with your health care provider. Document Revised: 07/21/2020 Document Reviewed: 07/21/2020 Elsevier Patient Education  2024 [employer]vier Inc.    Emil Schaumann, MD [facility]

## 2023-11-30 NOTE — Assessment & Plan Note (Signed)
 Chronic persistent symptoms affecting quality of life Was evaluated by podiatrist Gabapentin not working Recommend trial of duloxetine 30 mg daily

## 2023-11-30 NOTE — Patient Instructions (Signed)
 Health Maintenance After Age 68 After age 27, you are at a higher risk for certain long-term diseases and infections as well as injuries from falls. Falls are a major cause of broken bones and head injuries in people who are older than age 73. Getting regular preventive care can help to keep you healthy and well. Preventive care includes getting regular testing and making lifestyle changes as recommended by your health care provider. Talk with your health care provider about: Which screenings and tests you should have. A screening is a test that checks for a disease when you have no symptoms. A diet and exercise plan that is right for you. What should I know about screenings and tests to prevent falls? Screening and testing are the best ways to find a health problem early. Early diagnosis and treatment give you the best chance of managing medical conditions that are common after age 90. Certain conditions and lifestyle choices may make you more likely to have a fall. Your health care provider may recommend: Regular vision checks. Poor vision and conditions such as cataracts can make you more likely to have a fall. If you wear glasses, make sure to get your prescription updated if your vision changes. Medicine review. Work with your health care provider to regularly review all of the medicines you are taking, including over-the-counter medicines. Ask your health care provider about any side effects that may make you more likely to have a fall. Tell your health care provider if any medicines that you take make you feel dizzy or sleepy. Strength and balance checks. Your health care provider may recommend certain tests to check your strength and balance while standing, walking, or changing positions. Foot health exam. Foot pain and numbness, as well as not wearing proper footwear, can make you more likely to have a fall. Screenings, including: Osteoporosis screening. Osteoporosis is a condition that causes  the bones to get weaker and break more easily. Blood pressure screening. Blood pressure changes and medicines to control blood pressure can make you feel dizzy. Depression screening. You may be more likely to have a fall if you have a fear of falling, feel depressed, or feel unable to do activities that you used to do. Alcohol  use screening. Using too much alcohol  can affect your balance and may make you more likely to have a fall. Follow these instructions at home: Lifestyle Do not drink alcohol  if: Your health care provider tells you not to drink. If you drink alcohol : Limit how much you have to: 0-1 drink a day for women. 0-2 drinks a day for men. Know how much alcohol  is in your drink. In the [place] one drink equals one 12 oz bottle of beer (355 mL), one 5 oz glass of wine (148 mL), or one 1 oz glass of hard liquor (44 mL). Do not use any products that contain nicotine or tobacco. These products include cigarettes, chewing tobacco, and vaping devices, such as e-cigarettes. If you need help quitting, ask your health care provider. Activity  Follow a regular exercise program to stay fit. This will help you maintain your balance. Ask your health care provider what types of exercise are appropriate for you. If you need a cane or walker, use it as recommended by your health care provider. Wear supportive shoes that have nonskid soles. Safety  Remove any tripping hazards, such as rugs, cords, and clutter. Install safety equipment such as grab bars in bathrooms and safety rails on stairs. Keep rooms and walkways  well-lit. General instructions Talk with your health care provider about your risks for falling. Tell your health care provider if: You fall. Be sure to tell your health care provider about all falls, even ones that seem minor. You feel dizzy, tiredness (fatigue), or off-balance. Take over-the-counter and prescription medicines only as told by your health care provider. These include  supplements. Eat a healthy diet and maintain a healthy weight. A healthy diet includes low-fat dairy products, low-fat (lean) meats, and fiber from whole grains, beans, and lots of fruits and vegetables. Stay current with your vaccines. Schedule regular health, dental, and eye exams. Summary Having a healthy lifestyle and getting preventive care can help to protect your health and wellness after age 15. Screening and testing are the best way to find a health problem early and help you avoid having a fall. Early diagnosis and treatment give you the best chance for managing medical conditions that are more common for people who are older than age 42. Falls are a major cause of broken bones and head injuries in people who are older than age 64. Take precautions to prevent a fall at home. Work with your health care provider to learn what changes you can make to improve your health and wellness and to prevent falls. This information is not intended to replace advice given to you by your health care provider. Make sure you discuss any questions you have with your health care provider. Document Revised: 07/21/2020 Document Reviewed: 07/21/2020 Elsevier Patient Education  2024 [employer].

## 2023-12-05 ENCOUNTER — Other Ambulatory Visit (HOSPITAL_COMMUNITY): Payer: Self-pay

## 2023-12-09 ENCOUNTER — Other Ambulatory Visit: Payer: Self-pay | Admitting: Emergency Medicine

## 2023-12-09 ENCOUNTER — Other Ambulatory Visit (HOSPITAL_COMMUNITY): Payer: Self-pay

## 2023-12-09 MED ORDER — GABAPENTIN 300 MG PO CAPS
300.0000 mg | ORAL_CAPSULE | Freq: Every day | ORAL | 1 refills | Status: AC
  Filled 2023-12-09: qty 90, 90d supply, fill #0
  Filled 2024-03-16: qty 90, 90d supply, fill #1

## 2024-01-12 ENCOUNTER — Other Ambulatory Visit: Payer: Self-pay | Admitting: Emergency Medicine

## 2024-01-12 ENCOUNTER — Other Ambulatory Visit (HOSPITAL_COMMUNITY): Payer: Self-pay

## 2024-01-12 DIAGNOSIS — E785 Hyperlipidemia, unspecified: Secondary | ICD-10-CM

## 2024-01-12 MED ORDER — ATORVASTATIN CALCIUM 20 MG PO TABS
20.0000 mg | ORAL_TABLET | Freq: Every day | ORAL | 3 refills | Status: AC
  Filled 2024-01-12 – 2024-04-17 (×3): qty 90, 90d supply, fill #0

## 2024-01-13 ENCOUNTER — Other Ambulatory Visit: Payer: Self-pay | Admitting: Emergency Medicine

## 2024-01-13 ENCOUNTER — Other Ambulatory Visit (HOSPITAL_COMMUNITY): Payer: Self-pay

## 2024-01-13 DIAGNOSIS — I10 Essential (primary) hypertension: Secondary | ICD-10-CM

## 2024-01-13 MED ORDER — AMLODIPINE BESYLATE 5 MG PO TABS
5.0000 mg | ORAL_TABLET | Freq: Every day | ORAL | 3 refills | Status: AC
  Filled 2024-01-13: qty 90, 90d supply, fill #0

## 2024-01-17 ENCOUNTER — Other Ambulatory Visit: Payer: Self-pay | Admitting: Internal Medicine

## 2024-01-18 ENCOUNTER — Other Ambulatory Visit (HOSPITAL_COMMUNITY): Payer: Self-pay

## 2024-01-18 MED ORDER — INSUPEN PEN NEEDLES 32G X 4 MM MISC
1.0000 | Freq: Every day | 3 refills | Status: AC
  Filled 2024-01-18 – 2024-04-13 (×2): qty 100, 90d supply, fill #0

## 2024-01-18 NOTE — Telephone Encounter (Signed)
 Refill request complete

## 2024-01-19 ENCOUNTER — Other Ambulatory Visit (HOSPITAL_COMMUNITY): Payer: Self-pay

## 2024-02-16 ENCOUNTER — Other Ambulatory Visit (HOSPITAL_COMMUNITY): Payer: Self-pay

## 2024-02-28 ENCOUNTER — Other Ambulatory Visit (HOSPITAL_COMMUNITY): Payer: Self-pay

## 2024-02-29 ENCOUNTER — Other Ambulatory Visit (HOSPITAL_COMMUNITY): Payer: Self-pay

## 2024-03-14 ENCOUNTER — Other Ambulatory Visit (HOSPITAL_COMMUNITY): Payer: Self-pay

## 2024-03-16 ENCOUNTER — Other Ambulatory Visit (HOSPITAL_COMMUNITY): Payer: Self-pay

## 2024-03-21 ENCOUNTER — Other Ambulatory Visit: Payer: Self-pay | Admitting: Internal Medicine

## 2024-03-21 ENCOUNTER — Other Ambulatory Visit (HOSPITAL_COMMUNITY): Payer: Self-pay

## 2024-03-21 MED ORDER — MOUNJARO 5 MG/0.5ML ~~LOC~~ SOAJ
5.0000 mg | SUBCUTANEOUS | 3 refills | Status: DC
  Filled 2024-03-21: qty 2, 28d supply, fill #0

## 2024-03-30 ENCOUNTER — Other Ambulatory Visit: Payer: Self-pay

## 2024-03-30 ENCOUNTER — Encounter: Payer: Self-pay | Admitting: Internal Medicine

## 2024-03-30 ENCOUNTER — Other Ambulatory Visit (HOSPITAL_COMMUNITY): Payer: Self-pay

## 2024-03-30 ENCOUNTER — Ambulatory Visit: Admitting: Internal Medicine

## 2024-03-30 VITALS — BP 124/70 | HR 67 | Ht 73.0 in | Wt 271.4 lb

## 2024-03-30 DIAGNOSIS — E7849 Other hyperlipidemia: Secondary | ICD-10-CM

## 2024-03-30 DIAGNOSIS — E1141 Type 2 diabetes mellitus with diabetic mononeuropathy: Secondary | ICD-10-CM

## 2024-03-30 DIAGNOSIS — Z794 Long term (current) use of insulin: Secondary | ICD-10-CM

## 2024-03-30 LAB — POCT GLYCOSYLATED HEMOGLOBIN (HGB A1C): Hemoglobin A1C: 7.8 % — AB (ref 4.0–5.6)

## 2024-03-30 MED ORDER — TRESIBA FLEXTOUCH 200 UNIT/ML ~~LOC~~ SOPN
44.0000 [IU] | PEN_INJECTOR | Freq: Every day | SUBCUTANEOUS | 3 refills | Status: AC
  Filled 2024-03-30: qty 6, 27d supply, fill #0

## 2024-03-30 MED ORDER — OZEMPIC (2 MG/DOSE) 8 MG/3ML ~~LOC~~ SOPN
2.0000 mg | PEN_INJECTOR | SUBCUTANEOUS | 3 refills | Status: AC
  Filled 2024-03-30 (×2): qty 3, 28d supply, fill #0

## 2024-03-30 NOTE — Addendum Note (Signed)
 Addended by: CLEOTILDE ROLIN RAMAN on: 03/30/2024 02:53 PM   Modules accepted: Orders

## 2024-03-30 NOTE — Progress Notes (Signed)
 Patient ID: James Garcia, male   DOB: [birth year], 69 y.o.   MRN: [id number]  HPI: James Garcia is a 69 y.o.-year-old male, returning for follow-up for DM2, dx in 2007, insulin -dependent since 2016, uncontrolled, with complications (diabetic retinopathy, peripheral neuropathy). Pt. previously saw Dr. Kassie, but last visit with me 4 mo ago.  Interim history: No increased urination, nausea, chest pain.  He developed a pruritic rash on his back while on Mounjaro . He works from 3 PM to 11 PM.  He gets home around 12 AM.  Reviewed HbA1c: Lab Results  Component Value Date   HGBA1C 7.3 (A) 11/22/2023   HGBA1C 7.1 (A) 07/19/2023   HGBA1C 7.6 (A) 03/21/2023   HGBA1C 7.3 (H) 11/29/2022   HGBA1C 7.0 (A) 11/16/2022   HGBA1C 8.1 (A) 07/13/2022   HGBA1C 7.2 (A) 03/11/2022   HGBA1C 7.1 (A) 11/03/2021   HGBA1C 6.8 (A) 09/28/2021   HGBA1C 7.0 (A) 03/23/2021   Pt is on a regimen of: - Metformin  ER 1000 mg 2x a day, with meals -  - long needles (8 mm) >> Tresiba  U200 50 >> 44 units daily. - Ozempic  2 mg weekly >> Mounjaro  5 mg weekly >> rash on back He tried Farxiga  >> rash. Stopped glipizide  due to the lows.  Pt checks his sugars 2x a day and they are: - am: 80-218  >> 79-130 >> 84-178, 204 >> 102-165, 192, 207 - 2h after b'fast: n/c >> 169, 194, 268, 291 >> n/c  - before lunch: 156 >> n/c - 2h after lunch: n/c - before dinner: n/c - 2h after dinner: n/c - bedtime:110-287 >> 80s-120, 180 >> 100-164 >> 98, 151-181,  233 - nighttime: n/c Lowest sugar was 550 >> 70s; he has hypoglycemia awareness at 70s.  Highest sugar was 287 >> 180 >> 291 >> 233.  Glucometer: Freestyle Lite  Pt's meals are: - Breakfast: fried eggs + toast and coffee - Lunch: skips - Dinner: 7-8 am: meat + veggies (cafeteria at [facility]) - bedtime snack: chicken or fried egg  - no CKD, last BUN/creatinine:  Lab Results  Component Value Date   BUN 17 05/30/2023   BUN 16 11/29/2022   CREATININE 0.87  05/30/2023   CREATININE 0.87 11/29/2022   Lab Results  Component Value Date   MICRALBCREAT NOTE 07/19/2023  On Cozaar  100 mg daily.  - + HL; last set of lipids: Lab Results  Component Value Date   CHOL 117 05/30/2023   HDL 44.70 05/30/2023   LDLCALC 60 05/30/2023   TRIG 63.0 05/30/2023   CHOLHDL 3 05/30/2023  On Lipitor 20 mg daily.  - last eye exam was 08/18/2023. + DR.  - + numbness and tingling in his feet.  Last foot exam 03/2023. On Neurontin . He did not try the ALA. He did not see podiatry yet.  He has a history of HTN, constipation, history of gallbladder stone, history of TB.  ROS: + see HPI  Past Medical History:  Diagnosis Date   Allergy     IN MORNING SNEEZE   Bronchitis    CHRONIC   Constipation    uses OTC laxatives - hard stools every morning- small amount and feels like doesnt empty    Diabetes mellitus without complication (HCC)    DM type 2 (diabetes mellitus, type 2) (HCC)    GERD (gastroesophageal reflux disease)    Hyperlipidemia    Hypertension    Leg cramps    Tuberculin skin test (TST) positive  Tuberculosis    pt states was treated for TB   Past Surgical History:  Procedure Laterality Date   arm surgery     COLONOSCOPY     ELBOW SURGERY Right    FOOT SURGERY Bilateral    POLYPECTOMY     Social History   Socioeconomic History   Marital status: Married    Spouse name: Not on file   Number of children: 2   Years of education: Not on file   Highest education level: Not on file  Occupational History   Occupation: Floor Tech at [employer]   Tobacco Use   Smoking status: Former    Current packs/day: 0.00    Average packs/day: 0.3 packs/day for 5.0 years (1.3 ttl pk-yrs)    Types: Cigarettes    Start date: 06/03/1973    Quit date: 06/04/1978    Years since quitting: 45.8    Passive exposure: Never   Smokeless tobacco: Never  Vaping Use   Vaping status: Never Used  Substance and Sexual Activity   Alcohol use: No   Drug use: No    Sexual activity: Not on file  Other Topics Concern   Not on file  Social History Narrative   Lives with family       Social Drivers of Health   Tobacco Use: Medium Risk (11/30/2023)   Patient History    Smoking Tobacco Use: Former    Smokeless Tobacco Use: Never    Passive Exposure: Never  Programmer, Applications: Not on [employer] Insecurity: Not on file  Transportation Needs: Not on file  Physical Activity: Not on file  Stress: Not on file  Social Connections: Not on file  Intimate Partner Violence: Not on file  Depression (PHQ2-9): Low Risk (11/30/2023)   Depression (PHQ2-9)    PHQ-2 Score: 0  Alcohol Screen: Not on file  Housing: Not on file  Utilities: Not on file  Health Literacy: Not on file   Current Outpatient Medications on File Prior to Visit  Medication Sig Dispense Refill   amLODipine  (NORVASC ) 5 MG tablet Take 1 tablet (5 mg total) by mouth daily. 90 tablet 3   atorvastatin  (LIPITOR) 20 MG tablet Take 1 tablet (20 mg total) by mouth daily. 90 tablet 3   Blood Glucose Monitoring Suppl (FREESTYLE LITE) w/Device KIT Use to test blood sugar daily 1 kit 0   Blood Glucose Monitoring Suppl (ONETOUCH VERIO FLEX SYSTEM) w/Device KIT Use as advised 1 kit 0   Blood Pressure Monitor KIT Use as instructed for HTN 1 each 0   Blood Pressure Monitoring (OMRON 3 SERIES BP MONITOR) DEVI Use as directed 1 each 0   cetirizine  (ZYRTEC ) 10 MG tablet Take 10 mg by mouth as needed.     Continuous Glucose Sensor (FREESTYLE LIBRE 3 PLUS SENSOR) MISC Change sensor every 15 (fifteen) days. 6 each 3   DULoxetine  (CYMBALTA ) 30 MG capsule Take 1 capsule (30 mg total) by mouth daily. 30 capsule 3   fluticasone  (FLONASE ) 50 MCG/ACT nasal spray Place 1-2 sprays into both nostrils daily. 16 g 0   gabapentin  (NEURONTIN ) 300 MG capsule Take 1 capsule (300 mg total) by mouth at bedtime. 90 capsule 1   glucose blood (FREESTYLE LITE) test strip Use as instructed 3 times a day 300 each 3   insulin   degludec (TRESIBA  FLEXTOUCH) 200 UNIT/ML FlexTouch Pen Inject 44 Units into the skin daily. 18 mL 3   Insulin  Pen Needle (INSUPEN PEN NEEDLES) 32G X 4 MM  MISC Use once daily to administer insulin . 100 each 3   Lancets (FREESTYLE) lancets USE TO CHECK BLOOD SUGAR 2 TIMES PER DAY. 200 each 2   metFORMIN  (GLUCOPHAGE -XR) 500 MG 24 hr tablet Take 2 tablets (1,000 mg total) by mouth 2 (two) times daily with a meal. 360 tablet 3   methylPREDNISolone  (MEDROL  DOSEPAK) 4 MG TBPK tablet Take as directed (Patient not taking: Reported on 11/30/2023) 21 tablet 1   neomycin -bacitracin -polymyxin (NEOSPORIN) ointment Apply 1 application topically every 12 (twelve) hours. 15 g 0   omeprazole  (PRILOSEC) 20 MG capsule Take 1 capsule (20 mg total) by mouth every morning. 90 capsule 0   OneTouch Delica Lancets 33G MISC Use 3x a day 300 each 3   pramipexole  (MIRAPEX ) 0.5 MG tablet Take 1 tablet (0.5 mg total) by mouth 3 (three) times daily. 30 tablet 5   Semaglutide , 2 MG/DOSE, (OZEMPIC , 2 MG/DOSE,) 8 MG/3ML SOPN Inject 2 mg into the skin once a week. 9 mL 3   tirzepatide  (MOUNJARO ) 5 MG/0.5ML Pen Inject 5 mg into the skin once a week. 2 mL 3   valsartan -hydrochlorothiazide  (DIOVAN -HCT) 320-12.5 MG tablet Take 1 tablet by mouth daily. 90 tablet 3   [DISCONTINUED] albuterol  (PROVENTIL  HFA;VENTOLIN  HFA) 108 (90 Base) MCG/ACT inhaler Inhale 1-2 puffs into the lungs every 4 (four) hours as needed for wheezing or shortness of breath. 1 Inhaler 0   Current Facility-Administered Medications on File Prior to Visit  Medication Dose Route Frequency Provider Last Rate Last Admin   0.9 %  sodium chloride  infusion  500 mL Intravenous Once Pyrtle, Gordy HERO, MD       Allergies  Allergen Reactions   Insulin  Glargine Hives    specifically Semglee    Family History  Problem Relation Age of Onset   Diabetes Father    Colon cancer Neg Hx    Colon polyps Neg Hx    Rectal cancer Neg Hx    Stomach cancer Neg Hx    Crohn's disease Neg Hx     Esophageal cancer Neg Hx    Ulcerative colitis Neg Hx    PE: BP 124/70   Pulse 67   Ht 6' 1 (1.854 m)   Wt 271 lb 6.4 oz (123.1 kg)   SpO2 99%   BMI 35.81 kg/m  Wt Readings from Last 10 Encounters:  03/30/24 271 lb 6.4 oz (123.1 kg)  11/30/23 265 lb (120.2 kg)  11/22/23 264 lb (119.7 kg)  07/19/23 272 lb 6.4 oz (123.6 kg)  06/07/23 280 lb (127 kg)  05/30/23 279 lb (126.6 kg)  03/29/23 280 lb (127 kg)  03/21/23 280 lb 9.6 oz (127.3 kg)  11/29/22 278 lb 2 oz (126.2 kg)  11/16/22 276 lb 3.2 oz (125.3 kg)   Constitutional: overweight, in NAD Eyes: no exophthalmos ENT: no thyromegaly, no cervical lymphadenopathy Cardiovascular: RRR, No MRG Respiratory: CTA B Musculoskeletal: + deformities - L elbow deformity after being dislocated and healing in the wrong position Skin:  no rashes Neurological: no tremor with outstretched hands Diabetic Foot Exam - Simple   Simple Foot Form Diabetic Foot exam was performed with the following findings: Yes 03/30/2024  1:10 PM  Visual Inspection No deformities, no ulcerations, no other skin breakdown bilaterally: Yes Sensation Testing Intact to touch and monofilament testing bilaterally: Yes Pulse Check Posterior Tibialis and Dorsalis pulse intact bilaterally: Yes Comments    ASSESSMENT: 1. DM2, insulin -dependent, uncontrolled, with complications - DR - PN  2. HL  PLAN:  1. Patient with  longstanding, uncontrolled type 2 diabetes, on oral antidiabetic regimen with metformin  and also long-acting insulin  and weekly GLP-1/GIP receptor agonist, switch from GLP-1 receptor agonist at last visit.  At that time, sugars were mostly at goal in the morning but he did have hyperglycemic spikes after breakfast.  It was difficult to understand when his blood sugars were checked (before or after meals) last visit, and I recommended a CGM.  He agreed with this.  I also recommended to rotate his injection sites more.  He did have scar tissue on his  abdomen and I advised him to use the sides of his abdomen and also his upper thighs. - At today's visit, he tells me that he did start to rotate his injections more.  He was not able to obtain the CGM as this was expensive.  At today's visit, we reviewed his meter download, but it is difficult to discern between blood sugars checked in the morning and at night.  The sugars do appear to be elevated, with some sugars at goal, but also occasional hyperglycemic values up to 200s.  Upon questioning, he is not able to tolerate Mounjaro  due to rash.  I do not see evidence of a rash on his back, but he does mention that this is pruritic so at today's visit we discussed about switching back to Ozempic .  I sent a new prescription to his pharmacy.  We also discussed about improving diet now after the holidays. - I suggested to:  Patient Instructions  Please continue: - Metformin  ER 1000 mg 2x a day, with meals - Tresiba  U200 44 units daily  Switch from Mounjaro  to: - Ozempic  2 mg weekly  Please return in 3 months.   - we checked his HbA1c: 7.8% (higher) - advised to check sugars at different times of the day - 4x a day, rotating check times - advised for yearly eye exams >> he is UTD - He has peripheral neuropathy.  I previously recommended alpha-lipoic acid 600 mg twice a day but he forgot.  He is on gabapentin .  At last visit he described cramps in his feet and I advised him to start a multivitamin. - return to clinic in 4 months  2. HL - Latest lipid panel was reviewed from 05/2023: Fractions at goal: Lab Results  Component Value Date   CHOL 117 05/30/2023   HDL 44.70 05/30/2023   LDLCALC 60 05/30/2023   TRIG 63.0 05/30/2023   CHOLHDL 3 05/30/2023  - He continues Lipitor 20 mg daily.  He has some muscle cramps but unclear if from the statin.  Lela Fendt, MD PhD [facility] Endocrinology

## 2024-03-30 NOTE — Patient Instructions (Addendum)
 Please continue: - Metformin  ER 1000 mg 2x a day, with meals - Tresiba  U200 44 units daily  Switch from Mounjaro  to: - Ozempic  2 mg weekly  Please return in 3 months.

## 2024-04-13 ENCOUNTER — Other Ambulatory Visit: Payer: Self-pay

## 2024-04-17 ENCOUNTER — Other Ambulatory Visit: Payer: Self-pay

## 2024-06-19 ENCOUNTER — Ambulatory Visit: Admitting: Internal Medicine
